# Patient Record
Sex: Male | Born: 1970 | Race: White | Hispanic: No | Smoking: Former smoker
Health system: Southern US, Community
[De-identification: ages and names within clinical notes are randomized; demographics above are authoritative.]

## PROBLEM LIST (undated history)

## (undated) DIAGNOSIS — K219 Gastro-esophageal reflux disease without esophagitis: Secondary | ICD-10-CM

## (undated) DIAGNOSIS — J449 Chronic obstructive pulmonary disease, unspecified: Secondary | ICD-10-CM

## (undated) HISTORY — PX: ESOPHAGOGASTRODUODENOSCOPY ENDOSCOPY: SHX5814

## (undated) HISTORY — DX: Chronic obstructive pulmonary disease, unspecified: J44.9

## (undated) HISTORY — PX: OTHER SURGICAL HISTORY: SHX169

## (undated) HISTORY — PX: CHOLECYSTECTOMY: SHX55

---

## 2003-07-20 ENCOUNTER — Emergency Department (HOSPITAL_COMMUNITY): Admission: EM | Admit: 2003-07-20 | Discharge: 2003-07-20 | Payer: Self-pay | Admitting: *Deleted

## 2007-04-11 ENCOUNTER — Emergency Department (HOSPITAL_COMMUNITY): Admission: EM | Admit: 2007-04-11 | Discharge: 2007-04-12 | Payer: Self-pay | Admitting: Emergency Medicine

## 2007-04-13 ENCOUNTER — Emergency Department (HOSPITAL_COMMUNITY): Admission: EM | Admit: 2007-04-13 | Discharge: 2007-04-14 | Payer: Self-pay | Admitting: Emergency Medicine

## 2007-04-20 ENCOUNTER — Ambulatory Visit (HOSPITAL_COMMUNITY): Admission: RE | Admit: 2007-04-20 | Discharge: 2007-04-20 | Payer: Self-pay | Admitting: Family Medicine

## 2007-05-16 ENCOUNTER — Ambulatory Visit (HOSPITAL_COMMUNITY): Admission: RE | Admit: 2007-05-16 | Discharge: 2007-05-16 | Payer: Self-pay | Admitting: General Surgery

## 2007-05-16 ENCOUNTER — Encounter (INDEPENDENT_AMBULATORY_CARE_PROVIDER_SITE_OTHER): Payer: Self-pay | Admitting: General Surgery

## 2007-05-31 ENCOUNTER — Ambulatory Visit (HOSPITAL_COMMUNITY): Admission: RE | Admit: 2007-05-31 | Discharge: 2007-05-31 | Payer: Self-pay | Admitting: Family Medicine

## 2007-11-24 ENCOUNTER — Ambulatory Visit (HOSPITAL_COMMUNITY): Admission: RE | Admit: 2007-11-24 | Discharge: 2007-11-24 | Payer: Self-pay | Admitting: Internal Medicine

## 2007-11-24 ENCOUNTER — Encounter: Payer: Self-pay | Admitting: Orthopedic Surgery

## 2008-03-20 ENCOUNTER — Ambulatory Visit: Payer: Self-pay | Admitting: Gastroenterology

## 2008-03-23 ENCOUNTER — Ambulatory Visit: Payer: Self-pay | Admitting: Gastroenterology

## 2008-03-23 ENCOUNTER — Encounter: Payer: Self-pay | Admitting: Gastroenterology

## 2008-03-23 ENCOUNTER — Ambulatory Visit (HOSPITAL_COMMUNITY): Admission: RE | Admit: 2008-03-23 | Discharge: 2008-03-23 | Payer: Self-pay | Admitting: Gastroenterology

## 2008-04-04 ENCOUNTER — Ambulatory Visit (HOSPITAL_COMMUNITY): Admission: RE | Admit: 2008-04-04 | Discharge: 2008-04-04 | Payer: Self-pay | Admitting: Gastroenterology

## 2008-04-18 ENCOUNTER — Telehealth (INDEPENDENT_AMBULATORY_CARE_PROVIDER_SITE_OTHER): Payer: Self-pay

## 2008-05-30 DIAGNOSIS — R1013 Epigastric pain: Secondary | ICD-10-CM | POA: Insufficient documentation

## 2008-05-30 DIAGNOSIS — R109 Unspecified abdominal pain: Secondary | ICD-10-CM | POA: Insufficient documentation

## 2008-05-30 DIAGNOSIS — F1010 Alcohol abuse, uncomplicated: Secondary | ICD-10-CM | POA: Insufficient documentation

## 2008-05-30 DIAGNOSIS — R1084 Generalized abdominal pain: Secondary | ICD-10-CM | POA: Insufficient documentation

## 2008-09-05 ENCOUNTER — Ambulatory Visit: Payer: Self-pay | Admitting: Orthopedic Surgery

## 2008-09-05 DIAGNOSIS — M25519 Pain in unspecified shoulder: Secondary | ICD-10-CM | POA: Insufficient documentation

## 2008-09-05 DIAGNOSIS — M7580 Other shoulder lesions, unspecified shoulder: Secondary | ICD-10-CM

## 2008-09-05 DIAGNOSIS — M25819 Other specified joint disorders, unspecified shoulder: Secondary | ICD-10-CM | POA: Insufficient documentation

## 2008-10-25 ENCOUNTER — Ambulatory Visit: Payer: Self-pay | Admitting: Orthopedic Surgery

## 2008-10-25 DIAGNOSIS — M5124 Other intervertebral disc displacement, thoracic region: Secondary | ICD-10-CM | POA: Insufficient documentation

## 2008-11-29 ENCOUNTER — Encounter: Payer: Self-pay | Admitting: Orthopedic Surgery

## 2009-01-23 ENCOUNTER — Ambulatory Visit: Payer: Self-pay | Admitting: Orthopedic Surgery

## 2009-01-25 ENCOUNTER — Telehealth: Payer: Self-pay | Admitting: Orthopedic Surgery

## 2009-03-05 ENCOUNTER — Telehealth: Payer: Self-pay | Admitting: Orthopedic Surgery

## 2009-03-09 HISTORY — PX: COLONOSCOPY: SHX174

## 2009-04-03 ENCOUNTER — Encounter: Payer: Self-pay | Admitting: Orthopedic Surgery

## 2009-10-17 ENCOUNTER — Ambulatory Visit: Payer: Self-pay | Admitting: Gastroenterology

## 2009-10-17 DIAGNOSIS — K219 Gastro-esophageal reflux disease without esophagitis: Secondary | ICD-10-CM | POA: Insufficient documentation

## 2009-12-10 ENCOUNTER — Encounter (INDEPENDENT_AMBULATORY_CARE_PROVIDER_SITE_OTHER): Payer: Self-pay | Admitting: *Deleted

## 2009-12-19 ENCOUNTER — Ambulatory Visit: Payer: Self-pay | Admitting: Gastroenterology

## 2009-12-19 DIAGNOSIS — R1013 Epigastric pain: Secondary | ICD-10-CM

## 2009-12-19 DIAGNOSIS — K3189 Other diseases of stomach and duodenum: Secondary | ICD-10-CM | POA: Insufficient documentation

## 2009-12-19 LAB — CONVERTED CEMR LAB
Cholesterol, target level: 200 mg/dL
HDL goal, serum: 40 mg/dL
LDL Goal: 160 mg/dL

## 2010-01-27 ENCOUNTER — Ambulatory Visit: Payer: Self-pay | Admitting: Internal Medicine

## 2010-01-27 DIAGNOSIS — K921 Melena: Secondary | ICD-10-CM | POA: Insufficient documentation

## 2010-01-28 ENCOUNTER — Encounter: Payer: Self-pay | Admitting: Gastroenterology

## 2010-02-12 ENCOUNTER — Ambulatory Visit (HOSPITAL_COMMUNITY)
Admission: RE | Admit: 2010-02-12 | Discharge: 2010-02-12 | Payer: Self-pay | Source: Home / Self Care | Attending: Gastroenterology | Admitting: Gastroenterology

## 2010-04-08 NOTE — Assessment & Plan Note (Signed)
Summary: LUQ AND CHEST PAIN   Visit Type:  Follow-up Visit Primary Care Provider:  Charlies Silvers, M.D.  Chief Complaint:  left side abd pain and burning.  History of Present Illness: First evaluation JAN 2010-recommended OMP BID. Pt did Burning sensation in chest. Happens up to 2-3 times a day and feel  like he regurgitates as well. Occasional SOB: random. Can radiate to L shoulder blade. No problems swallowing or with vomiting. Has nausea. Feels like a ball of air in epigastrium. Drinks beer still once a week but has Sx worsen with beer sometimes. No weight loss. Appetite: good. Sx precipitated by food. Gained 15 lbs. No cigs. Dips-every day 3-4x/day. Uses Ibuprofen 2-3 times a week.  Current Medications (verified): 1)  Tums .... As Needed  Allergies (verified): No Known Drug Allergies  Past History:  Past Medical History: Gastritis **EGD- JAN 2010, no H. pylori Intermittent constipation and diarrhea: 1 every 2 weeks CHEST PAIN W/U 2009: STRESS TEST, CARDIAC ECHO  Past Surgical History: Cholecystectomy: 2009 Hand surgery  Family History: No FH of Colon Cancer or polyps  Social History: Patient is divorced.  work on [employer] No insurance  Review of Systems       JUNE 2011: 234 LBS  No BRBPR or Melena.  Per HPI otherwise all systems negative  Vital Signs:  Patient profile:   40 year old male Height:      72 inches Weight:      240 pounds BMI:     32.67 Temp:     97.8 degrees F oral Pulse rate:   76 / minute BP sitting:   118 / 88  (left arm) Cuff size:   regular  Vitals Entered By: Hendricks Limes LPN (October 17, 2009 9:08 AM)  Physical Exam  General:  Well developed, well nourished, no acute distress. Head:  Normocephalic and atraumatic. Eyes:  PERRLA, no icterus. Mouth:  No deformity or lesions.  Neck:  Supple; no masses. Lungs:  Clear throughout to auscultation. Heart:  Regular rate and rhythm; no murmurs. Abdomen:  Soft, mild TTP in epigastrium without  guarding and without rebound, nondistended. Normal bowel sounds. Extremities:  No edema or deformities noted. Neurologic:  Alert and  oriented x4;  grossly normal neurologically.  Impression & Recommendations:  Problem # 1:  GERD (ICD-530.81) Assessment New  LUQ and chest pain most like 2o to uncontrolled GERD, +/- NSAID gastritis. Pt's last EGD JAN 2010. Pt had gastritis likly 2o to NSAID use. DID NOT take OMP BECAUSE IT WAS TOO EXPENSIVE. Has gained 15 lbs and has other lifestyle factors contributiing to reflux. He is instructed to: take omeprazole 30 minutes before meals two times a day, FOLLOW REFLUX HO recommendations, Minimize alcohol intake, Lose 10-20 lbs, AND Follow up in 2 months. No acute indication for TCS.  CC: PCP  Orders: Est. Patient Level IV ([address])  Patient Instructions: 1)  Take omeprazole 30 minutes before meals two times a day. 2)  FOLLOW REFLUX HO recommendations. 3)  Minimize alcohol intake. 4)  Lose 10-20 lbs. **Your BMI shows you are obese.  5)  Follow up in 2 months. 6)  The medication list was reviewed and reconciled.  All changed / newly prescribed medications were explained.  A complete medication list was provided to the patient / caregiver. Prescriptions: OMEPRAZOLE 20 MG CPDR (OMEPRAZOLE) 1 by mouth 30 minutes prior to breakfast and supper  #60 x 11   Entered and Authorized by:   West Bali MD  Signed by:   West Bali MD on 10/17/2009   Method used:   Electronically to        [facility] (retail)       [address]       [place], [place]  [address]       Ph: [phone]       Fax: [phone]   RxID:   [id number]   Appended Document: LUQ AND CHEST PAIN 2 MONTH OPV IS IN THE COMPUTER

## 2010-04-08 NOTE — Consult Note (Signed)
Summary: Consult [facility] Danielle Dess  Consult [facility] Danielle Dess   Imported By: Cammie Sickle 06/13/2009 08:51:06  _____________________________________________________________________  External Attachment:    Type:   Image     Comment:   External Document

## 2010-04-08 NOTE — Letter (Signed)
Summary: Recall Office Visit  [facility]  [address]   [place], [place] [address]   Phone: [phone]  Fax: [phone]      December 10, 2009   Jeffrey [name] [address] [place], [place]  [address] [birth year]   Dear [name],   According to our records, it is time for you to schedule a follow-up office visit with [place].   At your convenience, please call [phone] to schedule an office visit. If you have any questions, concerns, or feel that this letter is in error, we would appreciate your call.   Sincerely,    Diana Eves  [facility] Ph: [phone]   Fax: [phone]

## 2010-04-08 NOTE — Letter (Signed)
Summary: TCS ORDER  TCS ORDER   Imported By: Ave Filter 01/28/2010 08:06:51  _____________________________________________________________________  External Attachment:    Type:   Image     Comment:   External Document

## 2010-04-08 NOTE — Assessment & Plan Note (Signed)
Summary: ABD PAIN, CONSTIPATION   Visit Type:  Follow-up Visit Primary Care Provider:  Lilyan Punt  Chief Complaint:  pain.  History of Present Illness: OMP two times a day cured the heartburn. Hurting in left side. Still having constipation. Always gurgling in left side. Having gas from 630-11a. Aching in back shoulder. Shooting pain in his belly. BM: after ExLAx- almost every day. Half good and half bad days. Stress at home, but more over his health. Always feels pressure in epigastrium.   Current Medications (verified): 1)  Omeprazole 20 Mg Cpdr (Omeprazole) .Marland Kitchen.. 1 By Mouth 30 Minutes Prior To Breakfast and Supper  Allergies (verified): No Known Drug Allergies  Past History:  Past Medical History: Last updated: 10/17/2009 Gastritis **EGD- JAN 2010, no H. pylori Intermittent constipation and diarrhea: 1 every 2 weeks CHEST PAIN W/U 2009: STRESS TEST, CARDIAC ECHO  Past Surgical History: Last updated: 10/17/2009 Cholecystectomy: 2009 Hand surgery  Social History: Patient is divorced. 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  Review of Systems       SEP 2010: CT A/P w/ IVC-Mild wall thickening with questioned edema in the ascending colon and hepatic flexure.  There is no substantial pericolonic edema oR inflammation but the vessels of the gastrocolic ligament/omentum are prominent.  Although there is no appreciable pericolonic edema or inflammation, a degree of inflammatory or infectious colitis cannot be excluded.  Vital Signs:  Patient profile:   40 year old male Height:      72 inches Weight:      239 pounds BMI:     32.53 Temp:     98.2 degrees F oral Pulse rate:   84 / minute BP sitting:   110 / 80  (left arm) Cuff size:   large  Vitals Entered By: Cloria Spring LPN (December 19, 2009 4:01 PM)  Physical Exam  General:  Well developed, well nourished, no acute distress. Head:  Normocephalic and atraumatic. Eyes:  PERRL, no icterus. Mouth:  No  deformity or lesions. Neck:  Supple; no masses. Lungs:  Clear throughout to auscultation. Heart:  Regular rate and rhythm; no murmurs. Abdomen:  Soft, MILD TTP RUQ, PERIUMBILICAL REGION AND BLQ, nondistended. Normal bowel sounds.  Impression & Recommendations:  Problem # 1:  DYSPEPSIA (ICD-536.8) Assessment Unchanged Likley 2o to IBS-C and/or NUD. Use Benefiber once daily. USE MILK OF MAGNESIA TABS ONCE DAILY. TAKE IMIPRAMINE AT BEDTIME. ADD PROBIOTICS DAILY. May use Levsin as needed for abd cramps.Drink 6 to 8 cups of water daily or (4) 500 ml bottles of water. Follow up in 6 weeks.  Apply for the [facility] discount. Consder TCS for abd pain/constipation.  CC: PCP  Other Orders: Est. Patient Level IV ([address])   Patient Instructions: 1)  Continue Omeprazole. 2)  Use Benefiber once daily. 3)  USE MILK OF MAGNESIA TABS ONCE DAILY. 4)  TAKE IMIPRAMINE AT BEDTIME. 5)  ADD PROBIOTICS DAILY. 6)  May use Levsin as needed for abd cramps. 7)  Drink 6 to 8 cups of water daily or (4) 500 ml bottles of water. 8)  Follow up in 6 weeks.  9)  Apply for the [facility] discount. 10)  The medication list was reviewed and reconciled.  All changed / newly prescribed medications were explained.  A complete medication list was provided to the patient / caregiver. Prescriptions: IMIPRAMINE HCL 10 MG TABS (IMIPRAMINE HCL) 1 by mouth at bedtime for 3 days then 2 by mouth at bedtime and then 3 by mouth qhs  #  90 x 5   Entered and Authorized by:   West Bali MD   Signed by:   West Bali MD on 12/20/2009   Method used:   Electronically to        [facility] [address] (retail)       [address]       [place], [place]  [address]       Ph: [phone]       Fax: [phone]   RxID:   [id number]   Appended Document: ABD PAIN, CONSTIPATION 6 WK F/U OPV IS IN THE COMPUTER

## 2010-04-10 NOTE — Assessment & Plan Note (Signed)
Summary: FU OV IN 6 WKS,DYSPEPSIA/SS   Visit Type:  Follow-up Visit Primary Care Provider:  Lilyan Punt  Chief Complaint:  F/U dyspepsia.  History of Present Illness: 40 y/o caucasian male w/ left costal margin pain, chronic mid-low back pain since 2007.  Pain worse w/ movement,  c/o foul-smelling flatus.  s/p chlecystectomy in 2008.  BM daily w/ benefiber.  Taking omeprazole 20mg  two times a day with good relief of heartburn & indigestion.  Denies dysphagia & odynophagia. c/o bright red bleeding in small amts on toilet tissue w/ wiping x few times think it may be henorrhoids.  Also noted mucous in stool.  Weight stable.  Appetite fine.  Rare motrin 600mg  for back pain.  Never started imipramine at bedtime, never picked it up.  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 1 By Mouth 30 Minutes Prior To Breakfast and Supper 2)  Imipramine Hcl 10 Mg Tabs (Imipramine Hcl) .Marland Kitchen.. 1 By Mouth At Bedtime For 3 Days Then 2 By Mouth At Bedtime and Then 3 By Mouth Qhs  Current Medications (verified): 1)  Omeprazole 20 Mg Cpdr (Omeprazole) .Marland Kitchen.. 1 By Mouth 30 Minutes Prior To Breakfast and Supper 2)  Benefiber .... Once Daily  Allergies (verified): No Known Drug Allergies  Past History:  Past Surgical History: Last updated: 10/17/2009 Cholecystectomy: 2009 Hand surgery  Past Medical History: Gastritis **EGD- JAN 2010, no H. pylori Intermittent constipation and diarrhea: 1 every 2 weeks CHEST PAIN W/U 2009: STRESS TEST, CARDIAC ECHO THORACIC DISC DISPLACEMENT IMPINGEMENT SYNDROME   Family History: Reviewed history from 10/17/2009 and no  changes required. No FH of Colon Cancer or polyps  Social History: Reviewed history from 12/19/2009 and no changes required. Patient is divorced. 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 Quit 2009 Alcohol Use - yes, 12 pk/wk, once -twice per wk Illicit Drug Use - no current, quit marijuana 2009 Patient gets regular exercise, 3x/wk gym Smoking Status:  quit Drug Use:  yes Does Patient Exercise:  yes  Review of Systems General:  Denies fever, chills, sweats, anorexia, fatigue, weakness, malaise, weight loss, and sleep disorder. CV:  Denies chest pains, angina, palpitations, syncope, dyspnea on exertion, orthopnea, PND, peripheral edema, and claudication. Resp:  Complains of dyspnea with exercise; denies dyspnea at rest, cough, sputum, wheezing, coughing up blood, and pleurisy; occ. GI:  Denies difficulty swallowing, pain on swallowing, vomiting blood, jaundice, and fecal incontinence. GU:  Denies urinary burning, blood in urine, urinary frequency, urinary hesitancy, nocturnal urination, and urinary incontinence. MS:  Complains of joint pain / LOM, joint stiffness, and low back pain. Derm:  Denies rash, itching, dry skin, hives, moles, warts, and unhealing ulcers. Psych:  Denies depression, anxiety, memory loss, suicidal ideation, hallucinations, paranoia, phobia, and confusion. Heme:  Denies bruising and enlarged lymph nodes.  Vital Signs:  Patient profile:   40 year old male Height:      72 inches Weight:      241 pounds BMI:     32.80 Temp:     99.1 degrees F oral Pulse rate:   80 / minute BP sitting:  120 / 80  (left arm) Cuff size:   large  Vitals Entered By: Cloria Spring LPN (January 27, 2010 3:02 PM)  Physical Exam  General:  Well developed, well nourished, no acute distress. Head:  Normocephalic and atraumatic. Eyes:  Sclera clear, no icterus. Mouth:  No deformity or lesions. Neck:  Supple; no masses. Lungs:  Clear throughout to  auscultation. Heart:  Regular rate and rhythm; no murmurs. Abdomen:  normal bowel sounds, obese, without guarding, without rebound, no distesion, no tenderness, no masses, and no hepatomegally or splenomegaly.   Msk:  Symmetrical with no gross deformities. Normal posture. Pulses:  Normal pulses noted. Extremities:  No clubbing, cyanosis, edema or deformities noted. Neurologic:  Alert and  oriented x4;  grossly normal neurologically. Skin:  intact without lesions or rashes Cervical Nodes:  no significant adenopathy Psych:  Alert and cooperative. Normal mood and affect.  Impression & Recommendations:  Problem # 1:  HEMATOCHEZIA (ICD-578.1) 40 y/o caucasian male with intermittant small volume hematocheza, most likely secondary to bening anorectal source, but we should rule out inflammatory bowel disease, NSAID-induced colitis, polyps or least likely colon ca.  Diagnostic colonoscopy to be performed by Dr. Lisbeth Renshaw in the near future.  I have discussed risks and benefits which include, but are not limited to, bleeding, infection, perforation, or medication reaction.  The patient agrees with this plan and consent will be obtained.  Orders: Est. Patient Level IV ([address])  Problem # 2:  ABDOMINAL PAIN (ICD-789.00) Chronic abd pain , most likely 2o to uncontrolled GERD, +/- NSAID gastritis, +/- functional component.  Pt has not yet tried imipramine.  Problem # 3:  GERD (ICD-530.81) Well controlled  Patient Instructions: 1)  Continue omeprazole 20 mg two times a day.  Appended Document: FU OV IN 6 WKS,DYSPEPSIA/SS 1.2 CM RECTAL POLYP. HEMORRHOIDS

## 2010-07-22 NOTE — H&P (Signed)
NAME:  Jeffrey [name], Jeffrey [name] NO.:  [id number]   MEDICAL RECORD NO.:  [id number]          PATIENT TYPE:  AMB   LOCATION:  DAY                           FACILITY:  [facility]   PHYSICIAN:  Dalia Heading, M.D.  DATE OF BIRTH:  08-11-1970   DATE OF ADMISSION:  DATE OF DISCHARGE:  LH                              HISTORY & PHYSICAL   CHIEF COMPLAINT:  Cholecystitis, cholelithiasis.   HISTORY OF PRESENT ILLNESS:  The patient is a 40 year old white male who  is referred for evaluation and treatment of biliary colic secondary to  cholelithiasis.  He also has biliary sludge present.  It has been  present intermittently for the past month.  He has right upper quadrant  abdominal pain with radiation to the flank, nausea, and indigestion.  It  is made worse with fatty foods.  No fever, chills, jaundice have been  noted.   PAST MEDICAL HISTORY:  Is unremarkable.   PAST SURGICAL HISTORY:  Unremarkable.   CURRENT MEDICATIONS:  None.   ALLERGIES:  No known drug allergies.   REVIEW OF SYSTEMS:  The patient does smoke tobacco.  He does drink  alcohol socially.   PHYSICAL EXAMINATION:  The patient is a well-developed, well-nourished  white male in no acute distress.  HEENT EXAMINATION:  Reveals no scleral icterus.  LUNGS:  Clear to auscultation with equal breath sounds bilaterally.  HEART EXAMINATION:  Reveals regular rate and rhythm without S3, S4, or  murmurs.  ABDOMEN:  Is soft, nontender, nondistended.  No hepatosplenomegaly,  masses, or hernias are identified.   Ultrasound gallbladder reveals sludge within a normal common bile duct.   IMPRESSION:  Cholecystitis, cholelithiasis.   PLAN:  The patient is scheduled for laparoscopic cholecystectomy on  May 16, 2007.  Risks and benefits of the procedure including bleeding,  infection, hepatobiliary injury, and the possibility of an open  procedure were fully explained to the patient, who gave informed  consent.      Dalia Heading, M.D.  Electronically Signed     MAJ/MEDQ  D:  05/12/2007  T:  05/12/2007  Job:  [id number]

## 2010-07-22 NOTE — Op Note (Signed)
NAME:  Jeffrey [name], Jeffrey [name] NO.:  [id number]   MEDICAL RECORD NO.:  [id number]          PATIENT TYPE:  AMB   LOCATION:  DAY                           FACILITY:  [facility]   PHYSICIAN:  Jeffrey [name], M.D.      DATE OF BIRTH:  13-Oct-1970   DATE OF PROCEDURE:  03/23/2008  DATE OF DISCHARGE:                                PROCEDURE NOTE   REFERRING Jeffrey [name]:  Jeffrey Picket A. Luking, MD   PROCEDURE:  Esophagogastroduodenoscopy with cold forceps biopsy, gastric  and duodenal mucosa.   INDICATION FOR EXAM:  Jeffrey [name] is a 40 year old male who complains of  abdominal discomfort.  He has had this discomfort for a year.  He had  his gallbladder removed because of it and some of his symptoms did  resolve.  There is achiness across the top of his abdomen and a  quivering.  He does drink alcohol.  He denies regular consumption of  anti-inflammatory drugs.  He notes that his symptoms are most when he is  sitting.  He does have difficulty moving his bowels since his  gallbladder came out. His symptoms are better with ice and exercise.   FINDINGS:  1. Normal esophagus without evidence of Barrett's, mass, erosion,      ulceration, or stricture.  2. Patchy erythema in the body and the antrum without erosion or      ulceration.  Biopsies obtained via cold forceps to evaluate for H.      pylori gastritis or eosinophilic gastritis.  3. Normal duodenal bulb and second portion of the duodenum.  Normal      ampulla.  Biopsies obtained via cold forceps to evaluate for celiac      sprue or eosinophilic duodenitis.   DIAGNOSES:  Mild gastritis.   RECOMMENDATIONS:  1. He should add Benefiber twice daily.  He should follow a high-      fiber, low-fat diet.  2. Will add Nexium 40 mg twice daily for 3 months.  He is asked to      avoid alcohol for 1 month.  3. No aspirin, NSAIDs, or anticoagulation for 7 days.  4. He already has a follow up appointment to see me in 2 months.  If      his symptoms  persist, then we will consider endoscopic ultrasound.  5. Could consider CT scan of the abdomen and pelvis with IV contrast      prior to endoscopic ultrasound.  His last abdominal imaging was an      U/S in February 2009.   MEDICATIONS:  1. Demerol 100 mg IV.  2. Versed 4 mg IV.  3. Phenergan 25 mg IV.   PROCEDURE TECHNIQUE:  Physical exam was performed.  Informed consent was  obtained from the patient after explaining the benefits, risks, and  alternatives to the procedure.  The patient was connected to the monitor  and placed in the left lateral position.  Continuous oxygen was provided  by nasal cannula and IV medicine was administered through an indwelling  cannula.  After administration of sedation, the patient's esophagus was  intubated and  the scope was advanced under direct visualization to the  second portion of the duodenum.  The scope was removed slowly by  carefully examining the color, texture, anatomy, and integrity of the  mucosa on the way out.  The patient was recovered in endoscopy and  discharged home in satisfactory condition.   PATH 88416:  nORMAL DUODENUM. mILD GASTRITIS. Will order CT Scan of  abd/PELVIS.      Jeffrey [name], M.D.  Electronically Signed     SM/MEDQ  D:  03/23/2008  T:  03/24/2008  Job:  [id number]   cc:   Jeffrey Picket A. Gerda Diss, MD  Fax: [phone]

## 2010-07-22 NOTE — H&P (Signed)
NAME:  Jeffrey [name], Jeffrey [name] NO.:  [id number]   MEDICAL RECORD NO.:  [id number]          PATIENT TYPE:  AMB   LOCATION:  DAY                           FACILITY:  [facility]   PHYSICIAN:  Kassie Mends, M.D.      DATE OF BIRTH:  11/08/1970   DATE OF ADMISSION:  DATE OF DISCHARGE:  LH                              HISTORY & PHYSICAL   REFERRING Aivan Fillingim:  Scott A. Gerda Diss, MD   REASON FOR VISIT:  Abdominal discomfort.   HISTORY OF PRESENT ILLNESS:  Jeffrey [name] is a 40 year old male who has  been having abdominal discomfort in January 2009.  It began at a super  bowl party.  He was drinking beer with his friends.  He came home and  had uncomfortable feeling in his chest.  He reports being possibly on  Nexium for 3 weeks which did not affect his chest pain.  He reportedly  had a cardiac workup which did not reveal any evidence of coronary  artery disease.  He was seen and evaluated in March 2009 for  cholelithiasis.  It was felt that perhaps the symptoms were related to  biliary colic.  He had an abdominal ultrasound in February 2009 that  showed biliary sludge and a normal common bile duct as well as pancreas.  In March 2009, he had a CT scan of the chest with contrast which showed  hyperaeration of the lungs, otherwise, it was an unremarkable CT scan of  the chest.  MRI of the thoracic spine without contrast in September 2009  revealed small paracentral disk perfusion in C7-T2 and a posterior  lateral disk bulge between T3-T4.  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  His  working out does not seem to exacerbate his discomfort.  The discomfort  that he has is an achiness that is across the top of his abdomen and  sometimes he feels that in his back.  He also sometimes feels a  quivering which is separate from the discomfort.  The quivering happens  once a week.  These symptoms of discomfort in his abdomen begin around 6-  7 p.m.  and last from 9-10 p.m.  He has tried ibuprofen and Prilosec in  the past without any change in his symptoms.  His alcohol consumption is  2 beers per week.  He does not use BC Powders, Goody's Powders, or  aspirin.  He notice his symptoms most when he is sitting.  His appetite  has been good.  He never eats spicy foods.  Removing his gallbladder did  help with his symptoms of chills, shaking, and clammy hands.  He has no  nausea, vomiting, or fever.  His face periodically gets red and hot.  Denies any diaphoresis or palpitations.  He is not having any problems  with his blood pressure or diarrhea.  Taking his gallbladder out caused  him to have more difficulty having bowel movements.  He may have a bowel  movement once every day or two.  He uses Ex-Lax as  needed to have a  bowel movement.  He denies any dysuria, hematuria, or pain in his  testicles.  He was examined and his testicles have felt something that  felt like a worm.  He denies any penile discharge, indigestion, problems  swallowing, or heartburn.  He has had no weight loss and his stress is  not increased.  One or two times a week, he does have shortness of  breath, but denies cough.  He cannot identify anything that precipitates  the discomfort and nothing makes it worse.  His symptoms are better with  ice and exercise.   PAST MEDICAL HISTORY:  None.   PAST SURGICAL HISTORY:  Per the HPI.   ALLERGIES:  No known drug allergies.   MEDICATIONS:  None.   FAMILY HISTORY:  He has no family history of colon cancer, colon polyps,  breast, uterine or ovarian cancer, or gastric cancer.   SOCIAL HISTORY:  He is separated for approximately 4 years and has 3  children age 64, 98, and 65.  He quit smoking in November 2009.  His  last alcohol consumption was last Friday.  He works at [employer].   PHYSICAL EXAMINATION:  VITAL SIGNS:  Weight 230 pounds, height 6 feet,  BMI 31.2 (slightly obese), temperature 98, blood  pressure 124/88, pulse  76.  GENERAL:  He is in no apparent distress, alert and oriented x4.  HEENT:  Atraumatic, normocephalic.  Pupils equal and reactive to light.  Mouth no oral lesions.  Posterior oropharynx without erythema or  exudate.  NECK:  Full range of motion.  No lymphadenopathy.  LUNGS:  Clear to auscultation bilaterally.  CARDIOVASCULAR:  Regular rhythm.  No murmur.  Normal S1 and S2.  ABDOMEN:  Bowel sounds present, soft, nontender, nondistended.  No  rebound or guarding.  No abdominal bruits.  Negative Carnett sign was  raised in his legs and his head and neck off the exam table.  EXTREMITIES:  No cyanosis or edema.  NEUROLOGIC:  He has no focal neurologic deficits.   ASSESSMENT:  Jeffrey [name] is a 40 year old male with epigastric  discomfort of unclear etiology.  The differential diagnosis includes non-  ulcer dyspepsia, uncontrolled gastroesophageal reflux disease,  pancreatic disease, or musculoskeletal, abdominal wall discomfort.  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   Thank you for allowing me to see Jeffrey [name] in consultation.  My  recommendations follow.   RECOMMENDATIONS:  1. He will be scheduled for an EGD and a biopsy of his gastric and his      duodenal mucosa to evaluate for celiac sprue, H.  Pylori gastritis,      and eosinophilic gastroenteritis as an etiology for his symptoms.      If his biopsies do not reveal an etiology for his symptoms, then      will ask him to take high dose PPI for 3 months.  If his symptoms      still persist, then he will be scheduled for an endoscopic      ultrasound.  2. He has a follow appointment to see me in 2 months.  Will not add a      PPI at this point due to prior treatment failure.      Kassie Mends, M.D.  Electronically Signed     SM/MEDQ  D:  03/20/2008  T:  03/21/2008  Job:  [id number]   cc:   Lorin Picket A. Luking,  MD  Fax: [phone]

## 2010-07-22 NOTE — Op Note (Signed)
NAME:  Jeffrey [name], Jeffrey [name] NO.:  [id number]   MEDICAL RECORD NO.:  [id number]          PATIENT TYPE:  AMB   LOCATION:  DAY                           FACILITY:  [facility]   PHYSICIAN:  Dalia Heading, M.D.  DATE OF BIRTH:  1971-01-29   DATE OF PROCEDURE:  05/16/2007  DATE OF DISCHARGE:                               OPERATIVE REPORT   PREOPERATIVE DIAGNOSIS:  Cholecystitis, cholelithiasis.   POSTOPERATIVE DIAGNOSIS:  Cholecystitis, cholelithiasis.   PROCEDURE:  Laparoscopic cholecystectomy.   SURGEON:  Dr. Franky Macho.   ANESTHESIA:  General endotracheal.   INDICATIONS:  The patient is a 40 year old white male who presents with  biliary colic secondary to cholelithiasis.  Risks and benefits of the  procedure including bleeding, infection, hepatobiliary, the possibility  of an open procedure were fully explained to the patient, who gave  informed consent.   PROCEDURE NOTE:  The patient was placed in supine position.  After  induction of general endotracheal anesthesia, the abdomen was prepped  and draped in the usual sterile technique with Betadine.  Surgical site  confirmation was performed.   A supraumbilical incision was made down to fascia.  Veress needle was  introduced into the abdominal cavity and confirmation of placement was  done using the saline drop test.  The abdomen was then insufflated to 16  mmHg pressure.  An 11 mm trocar was introduced into the abdominal cavity  under direct visualization without difficulty.  The patient was placed  in reversed Trendelenburg position.  An additional 11-mm trocar was  placed epigastric region and 5-mm trocars placed in the right upper  quadrant and right flank regions.  Liver was inspected and noted to be  within normal limits.  The gallbladder was retracted superior and  laterally.  The dissection was begun around the infundibulum of the  gallbladder.  The cystic duct was first identified.  Its juncture to the  infundibulum fully identified.  Endo clips placed proximally and  distally on the cystic duct and cystic duct was divided.  This was  likewise done to the cystic artery.  The gallbladder then freed away  from the gallbladder fossa using Bovie electrocautery.  The gallbladder  delivered through the epigastric trocar site using EndoCatch bag.  The  gallbladder fossa was inspected.  No abnormal bleeding or bile leakage  was noted.  Surgicel was placed in the gallbladder fossa.  All fluid and  air were then evacuated from the abdominal cavity prior to removal of  the trocars.   All wounds were irrigated with normal saline.  All wounds were checked  with 0.5 cm Sensorcaine.  The supraumbilical fascia was reapproximated  using a 0 Vicryl interrupted suture.  All skin incisions were closed  using staples.  Betadine ointment and dry sterile dressings were  applied.   All tape and needle counts were correct at the end of procedure.  The  patient was extubated in the operating room, went back to recovery room  awake in stable condition.   COMPLICATIONS:  None.   SPECIMEN:  Gallbladder.   BLOOD LOSS:  Minimal.  Dalia Heading, M.D.  Electronically Signed     MAJ/MEDQ  D:  05/16/2007  T:  05/17/2007  Job:  [id number]

## 2010-11-28 LAB — POCT CARDIAC MARKERS
CKMB, poc: <1 — ABNORMAL LOW
CKMB, poc: <1 — ABNORMAL LOW
CKMB, poc: <1 — ABNORMAL LOW
CKMB, poc: <1 — ABNORMAL LOW
Myoglobin, poc: 47
Myoglobin, poc: 50
Myoglobin, poc: 33.2
Myoglobin, poc: 41.8
Operator id: 208461
Operator id: 216221
Operator id: 196461
Operator id: 294341
Troponin i, poc: <0.05
Troponin i, poc: <0.05
Troponin i, poc: <0.05
Troponin i, poc: <0.05

## 2010-11-28 LAB — BASIC METABOLIC PANEL
BUN: 16
CO2: 25
Calcium: 9
Chloride: 105
Creatinine, Ser: 0.97
GFR calc Af Amer: >60
GFR calc non Af Amer: >60
Glucose, Bld: 101 — ABNORMAL HIGH
Potassium: 3.5
Sodium: 135

## 2010-12-01 LAB — CBC
HCT: 40.7
Hemoglobin: 14.5
MCHC: 35.6
MCV: 88.2
Platelets: 170
RBC: 4.61
RDW: 12.5
WBC: 4.8

## 2010-12-01 LAB — BASIC METABOLIC PANEL
BUN: 13
CO2: 27
Calcium: 9.1
Chloride: 103
Creatinine, Ser: 0.97
GFR calc Af Amer: >60
GFR calc non Af Amer: >60
Glucose, Bld: 85
Potassium: 3.8
Sodium: 135

## 2010-12-01 LAB — HEPATIC FUNCTION PANEL
ALT: 22
AST: 19
Albumin: 4.3
Alkaline Phosphatase: 58
Bilirubin, Direct: 0.1
Indirect Bilirubin: 0.3
Total Bilirubin: 0.4
Total Protein: 6.8

## 2011-02-28 ENCOUNTER — Other Ambulatory Visit: Payer: Self-pay | Admitting: Gastroenterology

## 2011-03-04 NOTE — Telephone Encounter (Signed)
Reminder in epic to follow up in 6 months to further refills

## 2011-03-04 NOTE — Telephone Encounter (Signed)
Please nic ov in 6 months.

## 2011-03-04 NOTE — Telephone Encounter (Signed)
Needs OV in six months.

## 2011-08-14 ENCOUNTER — Encounter: Payer: Self-pay | Admitting: Gastroenterology

## 2012-02-02 ENCOUNTER — Other Ambulatory Visit: Payer: Self-pay | Admitting: Gastroenterology

## 2012-02-02 ENCOUNTER — Encounter: Payer: Self-pay | Admitting: Gastroenterology

## 2012-02-02 NOTE — Telephone Encounter (Signed)
Mailed letter to patient to call our office to set up OV to further refills °

## 2012-02-02 NOTE — Telephone Encounter (Signed)
Needs OV for further refills

## 2012-02-19 ENCOUNTER — Encounter (HOSPITAL_COMMUNITY): Payer: Self-pay | Admitting: *Deleted

## 2012-02-19 ENCOUNTER — Emergency Department (HOSPITAL_COMMUNITY)
Admission: EM | Admit: 2012-02-19 | Discharge: 2012-02-19 | Disposition: A | Discharge status: Home / Self Care | Payer: Self-pay | Attending: Emergency Medicine | Admitting: Emergency Medicine

## 2012-02-19 ENCOUNTER — Emergency Department (HOSPITAL_COMMUNITY): Payer: Self-pay

## 2012-02-19 DIAGNOSIS — Z87891 Personal history of nicotine dependence: Secondary | ICD-10-CM | POA: Insufficient documentation

## 2012-02-19 DIAGNOSIS — R079 Chest pain, unspecified: Secondary | ICD-10-CM | POA: Insufficient documentation

## 2012-02-19 DIAGNOSIS — Z79899 Other long term (current) drug therapy: Secondary | ICD-10-CM | POA: Insufficient documentation

## 2012-02-19 LAB — COMPREHENSIVE METABOLIC PANEL
ALT: 27 U/L (ref 0–53)
AST: 21 U/L (ref 0–37)
Albumin: 4.2 g/dL (ref 3.5–5.2)
Alkaline Phosphatase: 72 U/L (ref 39–117)
BUN: 15 mg/dL (ref 6–23)
CO2: 28 mEq/L (ref 19–32)
Calcium: 9.2 mg/dL (ref 8.4–10.5)
Chloride: 103 mEq/L (ref 96–112)
Creatinine, Ser: 1.03 mg/dL (ref 0.50–1.35)
GFR calc Af Amer: >90 mL/min (ref >90)
GFR calc non Af Amer: 90 mL/min — ABNORMAL LOW (ref >90)
Glucose, Bld: 92 mg/dL (ref 70–99)
Potassium: 4 mEq/L (ref 3.5–5.1)
Sodium: 139 mEq/L (ref 135–145)
Total Bilirubin: 0.3 mg/dL (ref 0.3–1.2)
Total Protein: 7.6 g/dL (ref 6.0–8.3)

## 2012-02-19 LAB — TROPONIN I: Troponin I: <0.3 ng/mL (ref <0.30)

## 2012-02-19 LAB — CBC WITH DIFFERENTIAL/PLATELET
Basophils Absolute: 0 10*3/uL (ref 0.0–0.1)
Basophils Relative: 0 % (ref 0–1)
Eosinophils Absolute: 0 10*3/uL (ref 0.0–0.7)
Eosinophils Relative: 1 % (ref 0–5)
HCT: 41 % (ref 39.0–52.0)
Hemoglobin: 14.3 g/dL (ref 13.0–17.0)
Lymphocytes Relative: 28 % (ref 12–46)
Lymphs Abs: 1 10*3/uL (ref 0.7–4.0)
MCH: 31.5 pg (ref 26.0–34.0)
MCHC: 34.9 g/dL (ref 30.0–36.0)
MCV: 90.3 fL (ref 78.0–100.0)
Monocytes Absolute: 0.5 10*3/uL (ref 0.1–1.0)
Monocytes Relative: 13 % — ABNORMAL HIGH (ref 3–12)
Neutro Abs: 2.1 10*3/uL (ref 1.7–7.7)
Neutrophils Relative %: 58 % (ref 43–77)
Platelets: 160 10*3/uL (ref 150–400)
RBC: 4.54 MIL/uL (ref 4.22–5.81)
RDW: 12.7 % (ref 11.5–15.5)
WBC: 3.6 10*3/uL — ABNORMAL LOW (ref 4.0–10.5)

## 2012-02-19 NOTE — ED Provider Notes (Signed)
History     CSN: [id number]  Arrival date & time 02/19/12  1145   First MD Initiated Contact with Patient 02/19/12 1337      Chief Complaint  Patient presents with  . Chest Pain    (Consider location/radiation/quality/duration/timing/severity/associated sxs/prior treatment) Patient is a 41 y.o. male presenting with chest pain. The history is provided by the patient (pt complains of chest pain and numbness in hands and feet). No language interpreter was used.  Chest Pain The chest pain began 1 - 2 weeks ago. Chest pain occurs frequently. The chest pain is resolved. Associated with: othing. At its most intense, the pain is at 3/10. The pain is currently at 2/10. The severity of the pain is moderate. The quality of the pain is described as aching. The pain does not radiate. Pertinent negatives for primary symptoms include no fatigue, no cough and no abdominal pain.  Pertinent negatives for past medical history include no seizures.     History reviewed. No pertinent past medical history.  Past Surgical History  Procedure Date  . Cholecystectomy   . Esophagogastroduodenoscopy endoscopy   . Cardiac stress test     History reviewed. No pertinent family history.  History  Substance Use Topics  . Smoking status: Former Games developer  . Smokeless tobacco: Not on file  . Alcohol Use: Yes      Review of Systems  Constitutional: Negative for fatigue.  HENT: Negative for congestion, sinus pressure and ear discharge.   Eyes: Negative for discharge.  Respiratory: Negative for cough.   Cardiovascular: Positive for chest pain.  Gastrointestinal: Negative for abdominal pain and diarrhea.  Genitourinary: Negative for frequency and hematuria.  Musculoskeletal: Negative for back pain.  Skin: Negative for rash.  Neurological: Negative for seizures and headaches.  Hematological: Negative.   Psychiatric/Behavioral: Negative for hallucinations.    Allergies  Review of patient's allergies  indicates no known allergies.  Home Medications   Current Outpatient Rx  Name  Route  Sig  Dispense  Refill  . ADULT MULTIVITAMIN W/MINERALS CH   Oral   Take 1 tablet by mouth daily.         Marland Kitchen FISH OIL TRIPLE STRENGTH 1400 MG PO CAPS   Oral   Take 1 capsule by mouth every other day.         Marland Kitchen OMEPRAZOLE 20 MG PO CPDR                 BP 130/80  Pulse 70  Temp 97.6 F (36.4 C) (Oral)  Resp 16  Ht 6' (1.829 m)  Wt 225 lb (102.059 kg)  BMI 30.52 kg/m2  SpO2 100%  Physical Exam  Constitutional: He is oriented to person, place, and time. He appears well-developed.  HENT:  Head: Normocephalic and atraumatic.  Eyes: Conjunctivae normal and EOM are normal. No scleral icterus.  Neck: Neck supple. No thyromegaly present.  Cardiovascular: Normal rate and regular rhythm.  Exam reveals no gallop and no friction rub.   No murmur heard. Pulmonary/Chest: No stridor. He has no wheezes. He has no rales. He exhibits no tenderness.  Abdominal: He exhibits no distension. There is no tenderness. There is no rebound.  Musculoskeletal: Normal range of motion. He exhibits no edema.  Lymphadenopathy:    He has no cervical adenopathy.  Neurological: He is oriented to person, place, and time. Coordination normal.  Skin: No rash noted. No erythema.  Psychiatric: He has a normal mood and affect. His behavior is normal.  ED Course  Procedures (including critical care time)  Labs Reviewed  CBC WITH DIFFERENTIAL - Abnormal; Notable for the following:    WBC 3.6 (*)     Monocytes Relative 13 (*)     All other components within normal limits  COMPREHENSIVE METABOLIC PANEL - Abnormal; Notable for the following:    GFR calc non Af Amer 90 (*)     All other components within normal limits  TROPONIN I   Dg Chest 2 View  02/19/2012  *RADIOLOGY REPORT*  Clinical Data: Chest pain, shortness of breath.  CHEST - 2 VIEW  Comparison: 05/31/2007 CT  Findings: Cardiomediastinal contours within  normal range. Small right lower lobe nodular opacity is likely nipple shadow. Otherwise, lungs are predominately clear.  No pleural effusion or pneumothorax.  No acute osseous finding.  IMPRESSION: No radiographic evidence of acute cardiopulmonary process.  A small right lower lobe nodular opacity may correspond to nipple shadow can be confirmed with a follow-up with nipple markers.   Original Report Authenticated By: Jearld Lesch, M.D.      1. Chest pain       Date: 02/19/2012  Rate: 69  Rhythm: normal sinus rhythm  QRS Axis: normal  Intervals: normal  ST/T Wave abnormalities: normal  Conduction Disutrbances:none  Narrative Interpretation:   Old EKG Reviewed: none available   MDM          Benny Lennert, MD 02/19/12 1537

## 2012-02-19 NOTE — ED Notes (Signed)
Pain epigastric area intermittently  For 5 years, has had stress tests and ekg s done that were normal.  Now  Says he has noticed that hands and feet "get cold".for 1 week,  With sob.  Pain in t-spine area also.

## 2012-04-23 ENCOUNTER — Emergency Department (HOSPITAL_COMMUNITY): Payer: Self-pay

## 2012-04-23 ENCOUNTER — Encounter (HOSPITAL_COMMUNITY): Payer: Self-pay | Admitting: *Deleted

## 2012-04-23 ENCOUNTER — Emergency Department (HOSPITAL_COMMUNITY)
Admission: EM | Admit: 2012-04-23 | Discharge: 2012-04-23 | Disposition: A | Discharge status: Home / Self Care | Payer: Self-pay | Attending: Emergency Medicine | Admitting: Emergency Medicine

## 2012-04-23 DIAGNOSIS — R0602 Shortness of breath: Secondary | ICD-10-CM | POA: Insufficient documentation

## 2012-04-23 DIAGNOSIS — R509 Fever, unspecified: Secondary | ICD-10-CM | POA: Insufficient documentation

## 2012-04-23 DIAGNOSIS — R52 Pain, unspecified: Secondary | ICD-10-CM | POA: Insufficient documentation

## 2012-04-23 DIAGNOSIS — J111 Influenza due to unidentified influenza virus with other respiratory manifestations: Secondary | ICD-10-CM

## 2012-04-23 DIAGNOSIS — R059 Cough, unspecified: Secondary | ICD-10-CM | POA: Insufficient documentation

## 2012-04-23 DIAGNOSIS — R51 Headache: Secondary | ICD-10-CM | POA: Insufficient documentation

## 2012-04-23 DIAGNOSIS — R05 Cough: Secondary | ICD-10-CM | POA: Insufficient documentation

## 2012-04-23 DIAGNOSIS — J3489 Other specified disorders of nose and nasal sinuses: Secondary | ICD-10-CM | POA: Insufficient documentation

## 2012-04-23 DIAGNOSIS — Z87891 Personal history of nicotine dependence: Secondary | ICD-10-CM | POA: Insufficient documentation

## 2012-04-23 DIAGNOSIS — Z79899 Other long term (current) drug therapy: Secondary | ICD-10-CM | POA: Insufficient documentation

## 2012-04-23 DIAGNOSIS — IMO0001 Reserved for inherently not codable concepts without codable children: Secondary | ICD-10-CM | POA: Insufficient documentation

## 2012-04-23 MED ORDER — ALBUTEROL SULFATE HFA 108 (90 BASE) MCG/ACT IN AERS: 2.0000 | INHALATION_SPRAY | RESPIRATORY_TRACT | Status: DC | PRN

## 2012-04-23 MED ORDER — NAPROXEN 500 MG PO TABS: 500.0000 mg | ORAL_TABLET | Freq: Two times a day (BID) | ORAL | Status: DC

## 2012-04-23 MED ORDER — HYDROCODONE-ACETAMINOPHEN 5-325 MG PO TABS: 2.0000 | ORAL_TABLET | ORAL | Status: DC | PRN

## 2012-04-23 MED ORDER — ACETAMINOPHEN 325 MG PO TABS
650.0000 mg | ORAL_TABLET | Freq: Once | ORAL | Status: AC
  Administered 2012-04-23: 650 mg via ORAL
  Filled 2012-04-23: qty 2

## 2012-04-23 MED ORDER — KETOROLAC TROMETHAMINE 60 MG/2ML IM SOLN
60.0000 mg | Freq: Once | INTRAMUSCULAR | Status: AC
  Administered 2012-04-23: 60 mg via INTRAMUSCULAR
  Filled 2012-04-23: qty 2

## 2012-04-23 MED ORDER — OSELTAMIVIR PHOSPHATE 75 MG PO CAPS: 75.0000 mg | ORAL_CAPSULE | Freq: Two times a day (BID) | ORAL | Status: DC

## 2012-04-23 NOTE — ED Notes (Addendum)
Pt c/o nasal congestion, generalized body aches, fever, and decreased appetite x 2 days. Pt had ibuprofen at 3pm.

## 2012-04-23 NOTE — ED Provider Notes (Signed)
History     CSN: [id number]  Arrival date & time 04/23/12  4098   First MD Initiated Contact with Patient 04/23/12 1929      Chief Complaint  Patient presents with  . Fever  . Generalized Body Aches  . Headache    (Consider location/radiation/quality/duration/timing/severity/associated sxs/prior treatment) HPI Comments: Pt is a 42 y/o male with hx of gradual onset  2 days ago  of body aches, fever and cough.  Sx are constant, gradually worsening and nothing makes better or worse - stook motrin prior to arrival without improvement.  He did not get Flu shot this year.  He denies dysuria, diarrhea, abd pain, sore throat or runny nose.  Patient is a 42 y.o. male presenting with fever and headaches. The history is provided by the patient and a relative.  Fever Associated symptoms: cough, headaches and myalgias   Associated symptoms: no chest pain   Headache Associated symptoms: cough, fever and myalgias     History reviewed. No pertinent past medical history.  Past Surgical History  Procedure Laterality Date  . Cholecystectomy    . Esophagogastroduodenoscopy endoscopy    . Cardiac stress test      No family history on file.  History  Substance Use Topics  . Smoking status: Former Games developer  . Smokeless tobacco: Not on file  . Alcohol Use: Yes      Review of Systems  Constitutional: Positive for fever.  Respiratory: Positive for cough and shortness of breath.   Cardiovascular: Negative for chest pain.  Musculoskeletal: Positive for myalgias.  Neurological: Positive for headaches.  All other systems reviewed and are negative.    Allergies  Review of patient's allergies indicates no known allergies.  Home Medications   Current Outpatient Rx  Name  Route  Sig  Dispense  Refill  . Multiple Vitamin (MULTIVITAMIN WITH MINERALS) TABS   Oral   Take 1 tablet by mouth daily.         . Omega-3 Fatty Acids (FISH OIL TRIPLE STRENGTH) 1400 MG CAPS   Oral   Take 1  capsule by mouth every other day.         Marland Kitchen omeprazole (PRILOSEC) 20 MG capsule   Oral   Take 20 mg by mouth daily.          Marland Kitchen albuterol (PROVENTIL HFA;VENTOLIN HFA) 108 (90 BASE) MCG/ACT inhaler   Inhalation   Inhale 2 puffs into the lungs every 4 (four) hours as needed for wheezing or shortness of breath.   1 Inhaler   3   . HYDROcodone-acetaminophen (NORCO/VICODIN) 5-325 MG per tablet   Oral   Take 2 tablets by mouth every 4 (four) hours as needed for pain.   10 tablet   0   . naproxen (NAPROSYN) 500 MG tablet   Oral   Take 1 tablet (500 mg total) by mouth 2 (two) times daily with a meal.   30 tablet   0   . oseltamivir (TAMIFLU) 75 MG capsule   Oral   Take 1 capsule (75 mg total) by mouth every 12 (twelve) hours.   10 capsule   0     BP 116/81  Pulse 100  Temp(Src) 102.9 F (39.4 C) (Oral)  Ht 6' (1.829 m)  Wt 225 lb (102.059 kg)  BMI 30.51 kg/m2  SpO2 92%  Physical Exam  Nursing note and vitals reviewed. Constitutional: He appears well-developed and well-nourished.  Uncomfortable appearing  HENT:  Head: Normocephalic and atraumatic.  Mouth/Throat:  Oropharynx is clear and moist. No oropharyngeal exudate.  Oropharynx is clear without erythema exudate asymmetry or hypertrophy. Mucous membranes are moist, tympanic membranes are clear bilaterally, nasal passages are clear  Eyes: Conjunctivae and EOM are normal. Pupils are equal, round, and reactive to light. Right eye exhibits no discharge. Left eye exhibits no discharge. No scleral icterus.  Neck: Normal range of motion. Neck supple. No JVD present. No thyromegaly present.  Very supple neck without any meningismus, no lymphadenopathy and no thyromegaly  Cardiovascular: Normal rate, regular rhythm, normal heart sounds and intact distal pulses.  Exam reveals no gallop and no friction rub.   No murmur heard. Pulmonary/Chest: Effort normal and breath sounds normal. No respiratory distress. He has no wheezes. He  has no rales.  Speaks in full sentences, no increased work of breathing, normal lung sounds without wheezing rales or rhonchi, no accessory muscle use  Abdominal: Soft. Bowel sounds are normal. He exhibits no distension and no mass. There is no tenderness.  Musculoskeletal: Normal range of motion. He exhibits no edema and no tenderness.  Lymphadenopathy:    He has no cervical adenopathy.  Neurological: He is alert. Coordination normal.  Skin: Skin is warm and dry. No rash noted. No erythema.  Psychiatric: He has a normal mood and affect. His behavior is normal.    ED Course  Procedures (including critical care time)  Labs Reviewed - No data to display Dg Chest 2 View  04/23/2012  *RADIOLOGY REPORT*  Clinical Data: Cough and SOB.  Wheezing  CHEST - 2 VIEW  Comparison: 02/19/2012  Findings: Normal heart size.  No effusion or edema.  No airspace consolidation.  The visualized osseous structures is unremarkable.  IMPRESSION:  1.  No acute cardiopulmonary abnormalities.  No evidence for pneumonia.   Original Report Authenticated By: Signa Kell, M.D.      1. Influenza       MDM  The patient is a fever of 102.9, a pulse of 100, respirations are even and unlabored his oxygen saturation registered at 92% at triage. I will obtain a chest x-ray to rule out a pneumonia though I suspect that he has the flu given his myalgias, headache and dry cough. Toradol given for both myalgias and fever.   Pt improved after meds, home with f/u.  PA and lateral views of the chest were obtained by digital radiography. I have personally interpreted these x-rays and find her to be no signs of pulmonary infiltrate, cardiomegaly, subdiaphragmatic free air, soft tissue abnormality, no obvious bony abnormalities or fractures.  Tamiflu  Naprosyn  Vicodin  Albuterol MDI   Vida Roller, MD 04/23/12 2033

## 2012-04-23 NOTE — ED Notes (Signed)
Pt discharged. Pt stable at time of discharge. Medications reviewed pt has no questions regarding discharge at this time. Pt voiced understanding of discharge instructions.  

## 2012-05-31 ENCOUNTER — Other Ambulatory Visit: Payer: Self-pay | Admitting: Gastroenterology

## 2012-06-01 ENCOUNTER — Encounter: Payer: Self-pay | Admitting: Gastroenterology

## 2012-06-01 NOTE — Telephone Encounter (Signed)
Needs office visit for f/u of GERD. Overdue.

## 2012-07-28 ENCOUNTER — Other Ambulatory Visit: Payer: Self-pay | Admitting: Gastroenterology

## 2012-10-11 ENCOUNTER — Ambulatory Visit (INDEPENDENT_AMBULATORY_CARE_PROVIDER_SITE_OTHER): Payer: Self-pay | Admitting: Family Medicine

## 2012-10-11 ENCOUNTER — Encounter: Payer: Self-pay | Admitting: Family Medicine

## 2012-10-11 VITALS — BP 120/88 | HR 68 | Temp 98.3°F | Ht 72.0 in | Wt 231.8 lb

## 2012-10-11 DIAGNOSIS — M549 Dorsalgia, unspecified: Secondary | ICD-10-CM

## 2012-10-11 LAB — POCT URINALYSIS DIPSTICK: pH, UA: 7.5

## 2012-10-11 NOTE — Progress Notes (Signed)
  Subjective:    Patient ID: Jeffrey [name], male    DOB: Nov 26, 1970, 42 y.o.   MRN: [id number]  HPI Patient here for left rib pain. No other concerns. This came on a couple days ago got worse today he's had intermittent repetitive episodes of this left-sided pain for several years Patient states by the end of the evening his urine looks dark but he does not see any blood he denies abdominal pain but sometimes has epigastric discomfort he has had his gallbladder out in the past he's also had thorough cardiac workup which was negative had MRI of his lumbar spine which did in fact show some bulging of the gas this was back in 2009.  Pay states the pain is constant it isn't everyday pain it's worse with certain movements and rotations. Does not radiate down the arms or legs. Occasionally he does get lower back pain that radiates down the left leg. He does have very physical job. He tries anti-inflammatories for this he does not want pain medicines and he would prefer not to have any surgeries. PMH benign   Review of Systems see above no shortness of breath with this although the pain is worse with a deep breath with certain movements     Objective:   Physical Exam  Lungs are clear no crackles heart is regular pulse normal blood pressure is good subjective discomfort in the left paraspinal muscles and rotating around the left side.      Assessment & Plan:  Radicular pain probably related to a thoracic nerve impingement. Anti-inflammatory. Patient does not want narcotics. In addition to this I talked with the patient at length about what could be done he could continue conservative measures he does not want surgery but he would like to have an opinion if injections in his back might help. We will help set him up with Dr. Laurian [name] in [place]. In addition to this they may want him to do a up-to-date MRI but I will leave that decision to them

## 2012-12-19 ENCOUNTER — Telehealth: Payer: Self-pay | Admitting: Family Medicine

## 2012-12-19 NOTE — Telephone Encounter (Signed)
FYI - just found out today that pt had appt 11/17/12 w/Dr. Laurian Brim for possible injections (which he requested specifically) and he "No Showed"

## 2012-12-19 NOTE — Telephone Encounter (Signed)
Noted  

## 2013-01-19 ENCOUNTER — Other Ambulatory Visit: Payer: Self-pay

## 2013-01-19 ENCOUNTER — Emergency Department (HOSPITAL_COMMUNITY)
Admission: EM | Admit: 2013-01-19 | Discharge: 2013-01-19 | Disposition: A | Discharge status: Home / Self Care | Payer: Self-pay | Attending: Emergency Medicine | Admitting: Emergency Medicine

## 2013-01-19 ENCOUNTER — Encounter (HOSPITAL_COMMUNITY): Payer: Self-pay | Admitting: Emergency Medicine

## 2013-01-19 DIAGNOSIS — K219 Gastro-esophageal reflux disease without esophagitis: Secondary | ICD-10-CM | POA: Insufficient documentation

## 2013-01-19 DIAGNOSIS — Z87891 Personal history of nicotine dependence: Secondary | ICD-10-CM | POA: Insufficient documentation

## 2013-01-19 DIAGNOSIS — R112 Nausea with vomiting, unspecified: Secondary | ICD-10-CM | POA: Insufficient documentation

## 2013-01-19 DIAGNOSIS — R072 Precordial pain: Secondary | ICD-10-CM | POA: Insufficient documentation

## 2013-01-19 DIAGNOSIS — R61 Generalized hyperhidrosis: Secondary | ICD-10-CM | POA: Insufficient documentation

## 2013-01-19 DIAGNOSIS — R109 Unspecified abdominal pain: Secondary | ICD-10-CM | POA: Insufficient documentation

## 2013-01-19 DIAGNOSIS — R42 Dizziness and giddiness: Secondary | ICD-10-CM | POA: Insufficient documentation

## 2013-01-19 DIAGNOSIS — Z79899 Other long term (current) drug therapy: Secondary | ICD-10-CM | POA: Insufficient documentation

## 2013-01-19 HISTORY — DX: Gastro-esophageal reflux disease without esophagitis: K21.9

## 2013-01-19 LAB — CBC WITH DIFFERENTIAL/PLATELET
Basophils Absolute: 0 10*3/uL (ref 0.0–0.1)
Basophils Relative: 0 % (ref 0–1)
Eosinophils Absolute: 0.1 10*3/uL (ref 0.0–0.7)
Eosinophils Relative: 2 % (ref 0–5)
HCT: 39.3 % (ref 39.0–52.0)
Hemoglobin: 13.6 g/dL (ref 13.0–17.0)
Lymphocytes Relative: 27 % (ref 12–46)
Lymphs Abs: 0.8 10*3/uL (ref 0.7–4.0)
MCH: 31.4 pg (ref 26.0–34.0)
MCHC: 34.6 g/dL (ref 30.0–36.0)
MCV: 90.8 fL (ref 78.0–100.0)
Monocytes Absolute: 0.3 10*3/uL (ref 0.1–1.0)
Monocytes Relative: 12 % (ref 3–12)
Neutro Abs: 1.7 10*3/uL (ref 1.7–7.7)
Neutrophils Relative %: 60 % (ref 43–77)
Platelets: 145 10*3/uL — ABNORMAL LOW (ref 150–400)
RBC: 4.33 MIL/uL (ref 4.22–5.81)
RDW: 12.8 % (ref 11.5–15.5)
WBC: 2.9 10*3/uL — ABNORMAL LOW (ref 4.0–10.5)

## 2013-01-19 LAB — COMPREHENSIVE METABOLIC PANEL
ALT: 24 U/L (ref 0–53)
AST: 20 U/L (ref 0–37)
Albumin: 4.2 g/dL (ref 3.5–5.2)
Alkaline Phosphatase: 65 U/L (ref 39–117)
BUN: 13 mg/dL (ref 6–23)
CO2: 24 mEq/L (ref 19–32)
Calcium: 9.4 mg/dL (ref 8.4–10.5)
Chloride: 102 mEq/L (ref 96–112)
Creatinine, Ser: 0.73 mg/dL (ref 0.50–1.35)
GFR calc Af Amer: >90 mL/min (ref >90)
GFR calc non Af Amer: >90 mL/min (ref >90)
Glucose, Bld: 120 mg/dL — ABNORMAL HIGH (ref 70–99)
Potassium: 3.7 mEq/L (ref 3.5–5.1)
Sodium: 137 mEq/L (ref 135–145)
Total Bilirubin: 0.3 mg/dL (ref 0.3–1.2)
Total Protein: 7.7 g/dL (ref 6.0–8.3)

## 2013-01-19 LAB — LIPASE, BLOOD: Lipase: 30 U/L (ref 11–59)

## 2013-01-19 LAB — TROPONIN I: Troponin I: <0.3 ng/mL (ref <0.30)

## 2013-01-19 MED ORDER — ONDANSETRON HCL 4 MG PO TABS: 4.0000 mg | ORAL_TABLET | Freq: Three times a day (TID) | ORAL | Status: DC | PRN

## 2013-01-19 MED ORDER — MECLIZINE HCL 25 MG PO TABS: ORAL_TABLET | ORAL | Status: DC

## 2013-01-19 MED ORDER — ONDANSETRON HCL 4 MG/2ML IJ SOLN
4.0000 mg | Freq: Once | INTRAMUSCULAR | Status: AC
  Administered 2013-01-19: 4 mg via INTRAMUSCULAR
  Filled 2013-01-19: qty 2

## 2013-01-19 MED ORDER — ONDANSETRON 8 MG PO TBDP: 8.0000 mg | ORAL_TABLET | Freq: Once | ORAL | Status: DC

## 2013-01-19 MED ORDER — MECLIZINE HCL 12.5 MG PO TABS
25.0000 mg | ORAL_TABLET | Freq: Once | ORAL | Status: AC
  Administered 2013-01-19: 25 mg via ORAL
  Filled 2013-01-19: qty 2

## 2013-01-19 MED ORDER — LORAZEPAM 1 MG PO TABS
0.5000 mg | ORAL_TABLET | Freq: Once | ORAL | Status: AC
  Administered 2013-01-19: 0.5 mg via ORAL
  Filled 2013-01-19: qty 1

## 2013-01-19 NOTE — Progress Notes (Signed)
ED/CM noted patient did not have health insurance and/or PCP listed in the computer.  Patient was given the [place] resource handout with information on the clinics, food pantries, and the handout for new health insurance sign-up.  Patient expressed appreciation for this. 

## 2013-01-19 NOTE — ED Notes (Signed)
Pt. Woke up at 6 am this morning with dizziness. Patient tried to make it to the bathroom but felt like he was going to pass out. Patient feels nauseous but has had no episodes of vomiting. Epigastric pain started this morning and wraps around to the back. Patient is not diaphoretic. Alert and oriented. Denies any SOB

## 2013-01-19 NOTE — ED Provider Notes (Signed)
CSN: [id number]     Arrival date & time 01/19/13  0803 History  This chart was scribed for Ward Givens, MD by Quintella Reichert, ED scribe.  This patient was seen in room APA04/APA04 and the patient's care was started at 8:32 AM.   Chief Complaint  Patient presents with  . Dizziness  . Abdominal Pain  . Nausea    The history is provided by the patient and the spouse. No language interpreter was used.    HPI Comments: Jeffrey [name] is a 42 y.o. male who presents to the Emergency Department complaining of severe waxing-and-waning lightheadedness and dizziness that began on waking this morning.  Pt states he woke up at 6 AM and "felt like my head was spinning really really fast" and as if he would pass out if he did not move.  This resolved in a few seconds.  He got out of bed to get ready for work and when he began bending over the tub to get his hair wet to brush his hair he again developed the same symptoms which would recur whenever he bent his head forward and improve when he straightened up.  He then developed nausea, diaphoresis and substernal chest "discomfort" although he notes the latter is a longstanding frequent issue for him for several years.  He has also been belching which eases this discomfort slightly.  He has vomited one time.  Presently he states he still feels dizzy and lightheaded whenever he turns his head from left to right, vice versa or looks up.   He states that when he walks he does not feel like he is going to fall but "I can tell it wouldn't take very much to tip me over."  Wife states he has been walking but very slowly.  Pt denies HA, syncope, numbness or tingling in legs, focal weakness, or recent illness or URI symptoms.  He states he has otherwise been doing well recently and felt well when he went to bed last night.  He denies prior h/o similar symptoms.    PCP is Dr. Lilyan Punt.  Pt does not smoke.  He works on Engineer, drilling.   Past Medical History  Diagnosis  Date  . GERD (gastroesophageal reflux disease)      Past Surgical History  Procedure Laterality Date  . Cholecystectomy    . Esophagogastroduodenoscopy endoscopy    . Cardiac stress test      No family history on file.   History  Substance Use Topics  . Smoking status: Former Games developer  . Smokeless tobacco: Current User  . Alcohol Use: 7.2 oz/week    12 Cans of beer per week   lives at home Lives with spouse employed  Review of Systems  Constitutional: Positive for diaphoresis. Negative for fever.  HENT: Negative for congestion, rhinorrhea and sore throat.   Respiratory: Negative for cough.   Cardiovascular: Positive for chest pain (chronic).  Gastrointestinal: Positive for nausea and vomiting.  Neurological: Positive for dizziness and light-headedness. Negative for syncope, weakness, numbness and headaches.  All other systems reviewed and are negative.     Allergies  Review of patient's allergies indicates no known allergies.  Home Medications   Current Outpatient Rx  Name  Route  Sig  Dispense  Refill  . albuterol (PROVENTIL HFA;VENTOLIN HFA) 108 (90 BASE) MCG/ACT inhaler   Inhalation   Inhale 2 puffs into the lungs every 4 (four) hours as needed for wheezing or shortness of breath.  1 Inhaler   3   . HYDROcodone-acetaminophen (NORCO/VICODIN) 5-325 MG per tablet   Oral   Take 2 tablets by mouth every 4 (four) hours as needed for pain.   10 tablet   0   . Multiple Vitamin (MULTIVITAMIN WITH MINERALS) TABS   Oral   Take 1 tablet by mouth daily.         . naproxen (NAPROSYN) 500 MG tablet   Oral   Take 1 tablet (500 mg total) by mouth 2 (two) times daily with a meal.   30 tablet   0   . Omega-3 Fatty Acids (FISH OIL TRIPLE STRENGTH) 1400 MG CAPS   Oral   Take 1 capsule by mouth every other day.         Marland Kitchen omeprazole (PRILOSEC) 20 MG capsule   Oral   Take 20 mg by mouth daily.          Marland Kitchen omeprazole (PRILOSEC) 20 MG capsule      TAKE ONE  CAPSULE BY MOUTH TWICE DAILY 30 MINUTES PRIOR TO BREAKFAST AND SUPPER   60 capsule   3   . oseltamivir (TAMIFLU) 75 MG capsule   Oral   Take 1 capsule (75 mg total) by mouth every 12 (twelve) hours.   10 capsule   0    BP 121/72  Pulse 70  Temp(Src) 97.6 F (36.4 C)  Resp 15  Ht 6' (1.829 m)  Wt 225 lb (102.059 kg)  BMI 30.51 kg/m2  SpO2 97%  Vital signs normal    Physical Exam  Nursing note and vitals reviewed. Constitutional: He is oriented to person, place, and time. He appears well-developed and well-nourished.  Non-toxic appearance. He does not appear ill. No distress.  HENT:  Head: Normocephalic and atraumatic.  Right Ear: External ear normal.  Left Ear: External ear normal.  Nose: Nose normal. No mucosal edema or rhinorrhea.  Mouth/Throat: Oropharynx is clear and moist and mucous membranes are normal. No dental abscesses or uvula swelling.  Eyes: Conjunctivae and EOM are normal. Pupils are equal, round, and reactive to light.  No obvious nystagmus  Neck: Normal range of motion and full passive range of motion without pain. Neck supple.  Cardiovascular: Normal rate, regular rhythm and normal heart sounds.  Exam reveals no gallop and no friction rub.   No murmur heard. Pulmonary/Chest: Effort normal and breath sounds normal. No respiratory distress. He has no wheezes. He has no rhonchi. He has no rales. He exhibits no tenderness and no crepitus.    He indicates his pain in his chest is over the xyphoid process, nontender to palpation. Area of pain noted  Abdominal: Soft. Normal appearance and bowel sounds are normal. He exhibits no distension. There is no tenderness. There is no rebound and no guarding.  Musculoskeletal: Normal range of motion. He exhibits no edema and no tenderness.  Moves all extremities well.   Neurological: He is alert and oriented to person, place, and time. He has normal strength. No cranial nerve deficit.  Equal grips No pronator drift  bilaterally FTN equal bilaterally No weakness in UE or LE  Skin: Skin is warm, dry and intact. No rash noted. No erythema. No pallor.  Psychiatric: He has a normal mood and affect. His speech is normal and behavior is normal. His mood appears not anxious.    ED Course  Procedures (including critical care time)  Medications  ondansetron (ZOFRAN) injection 4 mg (4 mg Intramuscular Given 01/19/13 0849)  meclizine (  ANTIVERT) tablet 25 mg (25 mg Oral Given 01/19/13 0854)  meclizine (ANTIVERT) tablet 25 mg (25 mg Oral Given 01/19/13 0947)  LORazepam (ATIVAN) tablet 0.5 mg (0.5 mg Oral Given 01/19/13 0948)     DIAGNOSTIC STUDIES: Oxygen Saturation is 97% on room air, normal by my interpretation.    COORDINATION OF CARE: 8:43 AM-Discussed treatment plan which includes Antivert, Zofran, EKG and labs with pt at bedside and pt agreed to plan.   Review of his doctors office notes shows patient has had this chest pain for years. He had a MR of his thoracic spine in 2009 showing mild bulging discs. Most recently he was scheduled to get steroid injections in his T spine, but he missed his appointment.   9:33 AM-Pt states he is still feeling nauseated.  Informed pt that labs and cardiac workup are normal.  Discussed treatment plan which includes Ativan with pt at bedside and pt agreed to plan.   10:49 AM-On recheck pt states his dizziness is "better but it's still there."  He states that he no longer has a spinning sensation but when he tries to move he has an "unlevel feeling."  He has not yet attempted to walk.  Discussed treatment plan which includes attempting ambulation with pt at bedside and pt agreed to plan.   Pt ambulated by nursing staff and was noted to be steady on his feet.    Labs Review Results for orders placed during the hospital encounter of 01/19/13  CBC WITH DIFFERENTIAL      Result Value Range   WBC 2.9 (*) 4.0 - 10.5 K/uL   RBC 4.33  4.22 - 5.81 MIL/uL   Hemoglobin 13.6   13.0 - 17.0 g/dL   HCT 16.1  09.6 - 04.5 %   MCV 90.8  78.0 - 100.0 fL   MCH 31.4  26.0 - 34.0 pg   MCHC 34.6  30.0 - 36.0 g/dL   RDW 40.9  81.1 - 91.4 %   Platelets 145 (*) 150 - 400 K/uL   Neutrophils Relative % 60  43 - 77 %   Neutro Abs 1.7  1.7 - 7.7 K/uL   Lymphocytes Relative 27  12 - 46 %   Lymphs Abs 0.8  0.7 - 4.0 K/uL   Monocytes Relative 12  3 - 12 %   Monocytes Absolute 0.3  0.1 - 1.0 K/uL   Eosinophils Relative 2  0 - 5 %   Eosinophils Absolute 0.1  0.0 - 0.7 K/uL   Basophils Relative 0  0 - 1 %   Basophils Absolute 0.0  0.0 - 0.1 K/uL  TROPONIN I      Result Value Range   Troponin I <0.30  <0.30 ng/mL  COMPREHENSIVE METABOLIC PANEL      Result Value Range   Sodium 137  135 - 145 mEq/L   Potassium 3.7  3.5 - 5.1 mEq/L   Chloride 102  96 - 112 mEq/L   CO2 24  19 - 32 mEq/L   Glucose, Bld 120 (*) 70 - 99 mg/dL   BUN 13  6 - 23 mg/dL   Creatinine, Ser 7.82  0.50 - 1.35 mg/dL   Calcium 9.4  8.4 - 95.6 mg/dL   Total Protein 7.7  6.0 - 8.3 g/dL   Albumin 4.2  3.5 - 5.2 g/dL   AST 20  0 - 37 U/L   ALT 24  0 - 53 U/L   Alkaline Phosphatase 65  39 -  117 U/L   Total Bilirubin 0.3  0.3 - 1.2 mg/dL   GFR calc non Af Amer >90  >90 mL/min   GFR calc Af Amer >90  >90 mL/min  LIPASE, BLOOD      Result Value Range   Lipase 30  11 - 59 U/L   Laboratory interpretation all normal   Imaging Review No results found.     2009 MRI THORACIC SPINE WITHOUT CONTRAST   IMPRESSION:  1. Small left paracentral disc protrusion C7-T1.  2. Tiny right paracentral disc protrusion and T1-T2.  3. Shallow left posterolateral disc bulge T3-T4.  Provider: Gareth Morgan        EKG Interpretation   None       Date: 01/19/2013  Rate: 72  Rhythm: normal sinus rhythm  QRS Axis: normal  Intervals: normal  ST/T Wave abnormalities: normal  Conduction Disutrbances:none  Narrative Interpretation:   Old EKG Reviewed: unchanged from 02/19/2012    MDM   1. Vertigo      Discharge Medication List as of 01/19/2013 11:34 AM    START taking these medications   Details  meclizine (ANTIVERT) 25 MG tablet Take 1 or 2 po Q 6hrs for dizziness, Print    ondansetron (ZOFRAN) 4 MG tablet Take 1 tablet (4 mg total) by mouth every 8 (eight) hours as needed for nausea or vomiting (or dizziness)., Starting 01/19/2013, Until Discontinued, Print        Plan discharge   Devoria Albe, MD, FACEP   I personally performed the services described in this documentation, which was scribed in my presence. The recorded information has been reviewed and considered.    Ward Givens, MD 01/19/13 1450

## 2013-02-07 ENCOUNTER — Encounter: Payer: Self-pay | Admitting: Gastroenterology

## 2013-03-20 ENCOUNTER — Other Ambulatory Visit: Payer: Self-pay

## 2013-03-20 NOTE — Telephone Encounter (Signed)
Patient last seen 02/2010. Has to get from PCP or make ov here.

## 2013-04-03 ENCOUNTER — Ambulatory Visit (INDEPENDENT_AMBULATORY_CARE_PROVIDER_SITE_OTHER): Payer: Self-pay | Admitting: Family Medicine

## 2013-04-03 ENCOUNTER — Encounter: Payer: Self-pay | Admitting: Family Medicine

## 2013-04-03 VITALS — BP 132/78 | Temp 100.0°F | Ht 72.0 in | Wt 236.0 lb

## 2013-04-03 DIAGNOSIS — K5732 Diverticulitis of large intestine without perforation or abscess without bleeding: Secondary | ICD-10-CM

## 2013-04-03 DIAGNOSIS — R109 Unspecified abdominal pain: Secondary | ICD-10-CM

## 2013-04-03 MED ORDER — CIPROFLOXACIN HCL 500 MG PO TABS: 500.0000 mg | ORAL_TABLET | Freq: Two times a day (BID) | ORAL | Status: DC

## 2013-04-03 MED ORDER — METRONIDAZOLE 500 MG PO TABS: 500.0000 mg | ORAL_TABLET | Freq: Three times a day (TID) | ORAL | Status: AC

## 2013-04-03 NOTE — Progress Notes (Signed)
   Subjective:    Patient ID: Jeffrey [name], male    DOB: 1970/05/21, 43 y.o.   MRN: [id number]  Abdominal Pain This is a new problem. The current episode started in the past 7 days. The pain is located in the LUQ. The abdominal pain radiates to the back. Associated symptoms include a fever and nausea.   Saturday pain in left abd. Sore and achey,  Some chills with fever Moderate nausea hasm't ate as well today No cough Slight sore throat Normal BM today, no blood  PMH benign  Review of Systems  Constitutional: Positive for fever.  Gastrointestinal: Positive for nausea and abdominal pain.   No wheezing no chest    Objective:   Physical Exam Lungs are clear hearts regular prostate exam is normal mild tenderness in the left lower cord but no guarding or rebound       Assessment & Plan:  Probable diverticulitis. No need for any scan at this point. I talked with the patient I told him that going ahead with Flagyl and Cipro it if he is not better in 48 hours then we can progress forward with doing a scan he stated that that sounded fine to him. If he has progressive troubles he will followup accordingly. Warning signs were discussed in detail. If he starts having severe pain vomiting bloody stools immediately call us or go to ER

## 2013-04-03 NOTE — Patient Instructions (Signed)
Diverticulitis °A diverticulum is a small pouch or sac on the colon. Diverticulosis is the presence of these diverticula on the colon. Diverticulitis is the irritation (inflammation) or infection of diverticula. °CAUSES  °The colon and its diverticula contain bacteria. If food particles block the tiny opening to a diverticulum, the bacteria inside can grow and cause an increase in pressure. This leads to infection and inflammation and is called diverticulitis. °SYMPTOMS  °· Abdominal pain and tenderness. Usually, the pain is located on the left side of your abdomen. However, it could be located elsewhere. °· Fever. °· Bloating. °· Feeling sick to your stomach (nausea). °· Throwing up (vomiting). °· Abnormal stools. °DIAGNOSIS  °Your caregiver will take a history and perform a physical exam. Since many things can cause abdominal pain, other tests may be necessary. Tests may include: °· Blood tests. °· Urine tests. °· X-ray of the abdomen. °· CT scan of the abdomen. °Sometimes, surgery is needed to determine if diverticulitis or other conditions are causing your symptoms. °TREATMENT  °Most of the time, you can be treated without surgery. Treatment includes: °· Resting the bowels by only having liquids for a few days. As you improve, you will need to eat a low-fiber diet. °· Intravenous (IV) fluids if you are losing body fluids (dehydrated). °· Antibiotic medicines that treat infections may be given. °· Pain and nausea medicine, if needed. °· Surgery if the inflamed diverticulum has burst. °HOME CARE INSTRUCTIONS  °· Try a clear liquid diet (broth, tea, or water for as long as directed by your caregiver). You may then gradually begin a low-fiber diet as tolerated.  °A low-fiber diet is a diet with less than 10 grams of fiber. Choose the foods below to reduce fiber in the diet: °· White breads, cereals, rice, and pasta. °· Cooked fruits and vegetables or soft fresh fruits and vegetables without the skin. °· Ground or  well-cooked tender beef, ham, veal, lamb, pork, or poultry. °· Eggs and seafood. °· After your diverticulitis symptoms have improved, your caregiver may put you on a high-fiber diet. A high-fiber diet includes 14 grams of fiber for every 1000 calories consumed. For a standard 2000 calorie diet, you would need 28 grams of fiber. Follow these diet guidelines to help you increase the fiber in your diet. It is important to slowly increase the amount fiber in your diet to avoid gas, constipation, and bloating. °· Choose whole-grain breads, cereals, pasta, and brown rice. °· Choose fresh fruits and vegetables with the skin on. Do not overcook vegetables because the more vegetables are cooked, the more fiber is lost. °· Choose more nuts, seeds, legumes, dried peas, beans, and lentils. °· Look for food products that have greater than 3 grams of fiber per serving on the Nutrition Facts label. °· Take all medicine as directed by your caregiver. °· If your caregiver has given you a follow-up appointment, it is very important that you go. Not going could result in lasting (chronic) or permanent injury, pain, and disability. If there is any problem keeping the appointment, call to reschedule. °SEEK MEDICAL CARE IF:  °· Your pain does not improve. °· You have a hard time advancing your diet beyond clear liquids. °· Your bowel movements do not return to normal. °SEEK IMMEDIATE MEDICAL CARE IF:  °· Your pain becomes worse. °· You have an oral temperature above 102° F (38.9° C), not controlled by medicine. °· You have repeated vomiting. °· You have bloody or black, tarry stools. °·   Symptoms that brought you to your caregiver become worse or are not getting better. °MAKE SURE YOU:  °· Understand these instructions. °· Will watch your condition. °· Will get help right away if you are not doing well or get worse. °Document Released: 12/03/2004 Document Revised: 05/18/2011 Document Reviewed: 03/31/2010 °[employer]® Patient Information  ©2014 [employer]. ° °

## 2013-06-12 ENCOUNTER — Ambulatory Visit (INDEPENDENT_AMBULATORY_CARE_PROVIDER_SITE_OTHER): Payer: BC Managed Care – PPO | Admitting: Family Medicine

## 2013-06-12 ENCOUNTER — Encounter: Payer: Self-pay | Admitting: Family Medicine

## 2013-06-12 VITALS — BP 122/80 | Temp 98.5°F | Ht 72.0 in | Wt 231.0 lb

## 2013-06-12 DIAGNOSIS — IMO0002 Reserved for concepts with insufficient information to code with codable children: Secondary | ICD-10-CM

## 2013-06-12 DIAGNOSIS — M792 Neuralgia and neuritis, unspecified: Secondary | ICD-10-CM

## 2013-06-12 DIAGNOSIS — M7918 Myalgia, other site: Secondary | ICD-10-CM

## 2013-06-12 DIAGNOSIS — IMO0001 Reserved for inherently not codable concepts without codable children: Secondary | ICD-10-CM

## 2013-06-12 MED ORDER — NORTRIPTYLINE HCL 25 MG PO CAPS: ORAL_CAPSULE | ORAL | Status: DC

## 2013-06-12 NOTE — Progress Notes (Signed)
   Subjective:    Patient ID: Jeffrey [name], male    DOB: 01-13-1971, 43 y.o.   MRN: [id number]  Back Pain This is a recurrent problem. The problem occurs every several days. The problem has been gradually worsening since onset. Pain location: Under left shoulder blade. Radiates to: Neck and head. The pain is worse during the night. The symptoms are aggravated by sitting. Associated symptoms include abdominal pain and headaches. He has tried NSAIDs for the symptoms. The treatment provided mild relief.   PMH has had extensive testing of the thoracic spine that showed some bulging back in 2009 also has had gallbladder removed and endoscopy use stress test as well.   Review of Systems  Gastrointestinal: Positive for abdominal pain.  Musculoskeletal: Positive for back pain.  Neurological: Positive for headaches.       Objective:   Physical Exam  Lungs clear hearts regular abdomen soft no guarding or rebound or tenderness no masses are felt subjective discomfort in the left rhomboid area. Reflexes good.      Assessment & Plan:  Musculoskeletal pain try nortriptyline for radiculopathy pain if this does not help to notify us. Over the next month if doing much better no need to do other tests if not doing better then referral to musculoskeletal specialist. (possibly Dr Nelva Bush)

## 2013-06-12 NOTE — Patient Instructions (Signed)
In one month call us with update   If worse before then call

## 2013-07-18 ENCOUNTER — Telehealth: Payer: Self-pay

## 2013-07-18 NOTE — Telephone Encounter (Signed)
PLEASE CALL PT. WE CAN PUT HIM ON A CANCELLATION LIST AND IF SOMEONE CANCELS WE CAN CALL HIM. IF HE IS HAVING ABD PAIN AND DIARRHEA SINCE HAVING HIS GALLBLADDER OUT, HE SHOULD CALL THE SURGEON. HE CAN ALSO CHEW ONE TUMS WITH MEALS THREE TIMES A DAY. HE SHOULD STRICTLY FOLLOW A LOW FAT DIET.

## 2013-07-18 NOTE — Telephone Encounter (Signed)
He is having abd pain. He is also having diarrhea since his gallbladder has been taken out. His back is hurting in the middle. He think that his Laray Anger is flaring up. He is wanting to know if he needs to be seen sooner then June 17. Please advise

## 2013-07-18 NOTE — Telephone Encounter (Signed)
Tried to call with no answer  

## 2013-08-17 NOTE — Telephone Encounter (Signed)
Routing to [place] for f/u since Ginger is out.

## 2013-08-17 NOTE — Telephone Encounter (Signed)
Called and many rings and no answer.

## 2013-08-21 NOTE — Telephone Encounter (Signed)
Spoke with pt, he is doing some better. He is aware of appt with Dr. Oneida Alar on 08/23/2013 at 2:00 PM.

## 2013-08-23 ENCOUNTER — Encounter: Payer: Self-pay | Admitting: Gastroenterology

## 2013-08-23 ENCOUNTER — Other Ambulatory Visit: Payer: Self-pay | Admitting: Gastroenterology

## 2013-08-23 ENCOUNTER — Ambulatory Visit (INDEPENDENT_AMBULATORY_CARE_PROVIDER_SITE_OTHER): Payer: BC Managed Care – PPO | Admitting: Gastroenterology

## 2013-08-23 ENCOUNTER — Encounter (INDEPENDENT_AMBULATORY_CARE_PROVIDER_SITE_OTHER): Payer: Self-pay

## 2013-08-23 VITALS — BP 126/78 | HR 87 | Temp 97.9°F | Resp 20 | Ht 74.0 in | Wt 226.0 lb

## 2013-08-23 DIAGNOSIS — R1013 Epigastric pain: Secondary | ICD-10-CM

## 2013-08-23 DIAGNOSIS — Z86010 Personal history of colonic polyps: Secondary | ICD-10-CM

## 2013-08-23 DIAGNOSIS — D126 Benign neoplasm of colon, unspecified: Secondary | ICD-10-CM | POA: Insufficient documentation

## 2013-08-23 DIAGNOSIS — R634 Abnormal weight loss: Secondary | ICD-10-CM

## 2013-08-23 MED ORDER — ESOMEPRAZOLE MAGNESIUM 40 MG PO CPDR: 40.0000 mg | DELAYED_RELEASE_CAPSULE | Freq: Two times a day (BID) | ORAL | Status: DC

## 2013-08-23 NOTE — Assessment & Plan Note (Signed)
EAT FIBER FULL LIQUID DIET JUL 13. TCS JUL 14 ALL SISTERS, BROTHERS, CHILDREN, AND PARENTS NEED TO HAVE A COLONOSCOPY STARTING AT THE AGE OF 30 and then every 5 years

## 2013-08-23 NOTE — Patient Instructions (Addendum)
INCREASE NEXIUM. TAKE 30 MINUTES BEFORE MEALS BID.  FOLLOW A LOW FAT DIET. FOOD SHOULD BE BAKED, BROILED, GRILLED, OR BOILED.  MINIMIZE YOUR ALCOHOL USE.  FULL LIQUID DIET JUL 13. SEE INFO BELOW. LOWER AND UPPER ENDOSCOPY/POSSIBLE ESOPHAGEAL DILATION JUL 14.  FOLLOW UP IN 4 MOS.    Full Liquid Diet   Breads and Starches  Allowed: None are allowed except crackers WHOLE OR pureed (made into a thick, smooth soup) in soup.   Avoid: Any others.    Vegetables  Allowed: Strained tomato or vegetable juice. Vegetables pureed in soup.   Avoid: Any others.    Fruit  Allowed: Any strained fruit juices and fruit drinks. Include 1 serving of citrus or vitamin C-enriched fruit juice daily.   Avoid: Any others.  Meat and Meat Substitutes  Allowed: Egg  Avoid: Any meat, fish, or fowl. All cheese.  Milk  Allowed: Milk beverages, including milk shakes and instant breakfast mixes. Smooth yogurt.   Avoid: Any others. Avoid dairy products if not tolerated.    Soups and Combination Foods  Allowed: Broth, strained cream soups. Strained, broth-based soups.   Avoid: Any others.    Desserts and Sweets  Allowed: flavored gelatin, plain ice cream, sherbet, smooth pudding, frozen ice pops, pudding pops,, frozen fudge pops, chocolate syrup. Sugar, honey, jelly, syrup.   Avoid: Any others.  Fats and Oils  Allowed: Margarine, butter, cream, sour cream, oils.   Avoid: Any others.  Beverages  Allowed: All.   Avoid: None.  Condiments  Allowed: Iodized salt, pepper, spices, flavorings. Cocoa powder.   Avoid: Any others.    SAMPLE MEAL PLAN Breakfast   cup orange juice.   1 cup  milk.   1 cup beverage (coffee or tea).   Cream or sugar, if desired.    Midmorning Snack  2 SCRAMBLED OR HARD BOILED EGG   Lunch  1 cup cream soup.    cup fruit juice.   1 cup milk.    cup custard.   1 cup beverage (coffee or tea).   Cream or sugar, if desired.     Midafternoon Snack  1 cup milk shake.  Dinner  1 cup cream soup.    cup fruit juice.   1 cup milk.    cup pudding.   1 cup beverage (coffee or tea).   Cream or sugar, if desired.  Evening Snack  1 cup supplement.

## 2013-08-23 NOTE — Assessment & Plan Note (Signed)
Most likely due to uncontrolled GERD/STRICTURE, LESS LIKELY CHRONIC PANCREATITIS, H PYLORI GASTRITIS, OR GE JXN TUMOR.  INCREASE NEXIUM. TAKE 30 MINUTES BEFORE MEALS BID. LOW FAT DIET MINIMIZE ETOH USE EGD/?DIL JUL 14 FOLLOW UP IN 4 MOS.

## 2013-08-23 NOTE — Progress Notes (Signed)
   Subjective:    Patient ID: Jeffrey [name], male    DOB: 03/15/1970, 43 y.o.   MRN: [id number]  Sallee Lange, MD  HPI Reflux never really right. Still has times can use bathroom: watery/solid. MAY RUN RIGHT THROUGH HIM. CAN GET TO HURTING IN HIS EPIGASTRIUM. NOTICES IN EVENING AND LAST FOR 1-2 HRS. PAIN IN EPIGASTRIUM RADIATING TO HIS BACK: 3-4 DAYS A WEEK. NO REAL TRIGGERS. STILL DRINKING ETOH. SMOKES SOME POT. MAY CAUSE PAIN IN MID ABD RADIATING TO BACK. FEELS NOT HIMSELF BUT NO VOMITING. BMs: 1-0-4-5. RARE FEELING BURNING CHEST PAIN AND FOOD BUBBLES BACK UP. MAY FEEL LIKE FACE IS RED AND HOT BUT NOT ASSOCIATED WITH PALPITATIONS OR DIZZINESS. WATERY STOOLS OFF AND ON BUT CONSTIPATION IS MORE COMMON. SX NO WORSE THAN WERE BACK IN 2011. PT DENIES FEVER, CHILLS, HEMATOCHEZIA,  vomiting, melena, SHORTNESS OF BREATH, CHANGE IN BOWEL IN HABITS, constipation, problems swallowing, OR problems with sedation.    Past Medical History  Diagnosis Date  . GERD (gastroesophageal reflux disease)    Past Surgical History  Procedure Laterality Date  . Cholecystectomy    . Esophagogastroduodenoscopy endoscopy    . Cardiac stress test     No Known Allergies  Current Outpatient Prescriptions  Medication Sig Dispense Refill  . albuterol (PROVENTIL HFA;VENTOLIN HFA) 108 (90 BASE) MCG/ACT inhaler Inhale 2 puffs into the lungs every 4 (four) hours as needed for wheezing or shortness of breath.    . esomeprazole (NEXIUM) 20 MG capsule Take 1 capsule (40 mg total) by mouth  daily before a meal.    . ibuprofen (ADVIL,MOTRIN) 200 MG tablet Take 400 mg by mouth every 8 (eight) hours as needed.    . vitamin C (ASCORBIC ACID) 500 MG tablet Take 500 mg by mouth daily.    . meclizine (ANTIVERT) 25 MG tablet Take 1 or 2 po Q 6hrs for dizziness    .      Marland Kitchen       Family History  Problem Relation Age of Onset  . Colon cancer Neg Hx   . Colon polyps Neg Hx     History  Substance Use Topics  . Smoking status:  Former Research scientist (life sciences)  . Smokeless tobacco: Current User  . Alcohol Use: 7.2 oz/week    12 Cans of beer per week        Review of Systems PER HPI OTHERWISE ALL SYSTEMS ARE NEGATIVE.     Objective:   Physical Exam  Vitals reviewed. Constitutional: He is oriented to person, place, and time. He appears well-developed and well-nourished. No distress.  HENT:  Head: Normocephalic and atraumatic.  Mouth/Throat: Oropharynx is clear and moist. No oropharyngeal exudate.  Eyes: Pupils are equal, round, and reactive to light. No scleral icterus.  Neck: Normal range of motion. Neck supple.  Cardiovascular: Normal rate, regular rhythm and normal heart sounds.   Pulmonary/Chest: Effort normal and breath sounds normal. No respiratory distress.  Abdominal: Soft. Bowel sounds are normal. He exhibits no distension. There is tenderness. There is no rebound and no guarding.  MILD TTP IN THE EPIGASTRIUM    Musculoskeletal: He exhibits no edema.  Lymphadenopathy:    He has no cervical adenopathy.  Neurological: He is alert and oriented to person, place, and time.  NO FOCAL DEFICITS   Psychiatric: He has a normal mood and affect.          Assessment & Plan:

## 2013-08-24 NOTE — Progress Notes (Signed)
cc'd to pcp 

## 2013-08-28 NOTE — Progress Notes (Signed)
Reminder in epic °

## 2013-09-06 ENCOUNTER — Encounter (HOSPITAL_COMMUNITY): Payer: Self-pay | Admitting: Pharmacy Technician

## 2013-09-19 ENCOUNTER — Encounter (HOSPITAL_COMMUNITY)
Admission: RE | Disposition: A | Discharge status: Home / Self Care | Payer: Self-pay | Source: Ambulatory Visit | Attending: Gastroenterology

## 2013-09-19 ENCOUNTER — Ambulatory Visit (HOSPITAL_COMMUNITY)
Admission: RE | Admit: 2013-09-19 | Discharge: 2013-09-19 | Disposition: A | Discharge status: Home / Self Care | Payer: BC Managed Care – PPO | Source: Ambulatory Visit | Attending: Gastroenterology | Admitting: Gastroenterology

## 2013-09-19 ENCOUNTER — Encounter (HOSPITAL_COMMUNITY): Payer: Self-pay | Admitting: *Deleted

## 2013-09-19 DIAGNOSIS — Q391 Atresia of esophagus with tracheo-esophageal fistula: Secondary | ICD-10-CM | POA: Insufficient documentation

## 2013-09-19 DIAGNOSIS — K3189 Other diseases of stomach and duodenum: Secondary | ICD-10-CM

## 2013-09-19 DIAGNOSIS — Z860100 Personal history of colon polyps, unspecified: Secondary | ICD-10-CM

## 2013-09-19 DIAGNOSIS — K311 Adult hypertrophic pyloric stenosis: Secondary | ICD-10-CM | POA: Insufficient documentation

## 2013-09-19 DIAGNOSIS — K2960 Other gastritis without bleeding: Secondary | ICD-10-CM

## 2013-09-19 DIAGNOSIS — R634 Abnormal weight loss: Secondary | ICD-10-CM

## 2013-09-19 DIAGNOSIS — Z87891 Personal history of nicotine dependence: Secondary | ICD-10-CM | POA: Insufficient documentation

## 2013-09-19 DIAGNOSIS — F1210 Cannabis abuse, uncomplicated: Secondary | ICD-10-CM | POA: Insufficient documentation

## 2013-09-19 DIAGNOSIS — F1010 Alcohol abuse, uncomplicated: Secondary | ICD-10-CM | POA: Insufficient documentation

## 2013-09-19 DIAGNOSIS — Q393 Congenital stenosis and stricture of esophagus: Secondary | ICD-10-CM

## 2013-09-19 DIAGNOSIS — R1013 Epigastric pain: Secondary | ICD-10-CM

## 2013-09-19 DIAGNOSIS — K644 Residual hemorrhoidal skin tags: Secondary | ICD-10-CM | POA: Insufficient documentation

## 2013-09-19 DIAGNOSIS — K5730 Diverticulosis of large intestine without perforation or abscess without bleeding: Secondary | ICD-10-CM | POA: Insufficient documentation

## 2013-09-19 DIAGNOSIS — K2990 Gastroduodenitis, unspecified, without bleeding: Secondary | ICD-10-CM

## 2013-09-19 DIAGNOSIS — D126 Benign neoplasm of colon, unspecified: Secondary | ICD-10-CM

## 2013-09-19 DIAGNOSIS — K2970 Gastritis, unspecified, without bleeding: Secondary | ICD-10-CM | POA: Insufficient documentation

## 2013-09-19 HISTORY — PX: MALONEY DILATION: SHX5535

## 2013-09-19 HISTORY — PX: ESOPHAGOGASTRODUODENOSCOPY: SHX5428

## 2013-09-19 HISTORY — PX: COLONOSCOPY: SHX5424

## 2013-09-19 HISTORY — PX: SAVORY DILATION: SHX5439

## 2013-09-19 SURGERY — COLONOSCOPY
Anesthesia: Moderate Sedation

## 2013-09-19 MED ORDER — MEPERIDINE HCL 100 MG/ML IJ SOLN
INTRAMUSCULAR | Status: DC | PRN
  Administered 2013-09-19: 50 mg
  Administered 2013-09-19: 25 mg via INTRAVENOUS
  Administered 2013-09-19: 50 mg

## 2013-09-19 MED ORDER — STERILE WATER FOR IRRIGATION IR SOLN
Status: DC | PRN
  Administered 2013-09-19: 10:00:00

## 2013-09-19 MED ORDER — SODIUM CHLORIDE 0.9 % IJ SOLN
INTRAMUSCULAR | Status: AC
  Filled 2013-09-19: qty 10

## 2013-09-19 MED ORDER — PROMETHAZINE HCL 25 MG/ML IJ SOLN
INTRAMUSCULAR | Status: AC
  Filled 2013-09-19: qty 1

## 2013-09-19 MED ORDER — MIDAZOLAM HCL 5 MG/5ML IJ SOLN
INTRAMUSCULAR | Status: AC
  Filled 2013-09-19: qty 10

## 2013-09-19 MED ORDER — SODIUM CHLORIDE 0.9 % IV SOLN
INTRAVENOUS | Status: DC
  Administered 2013-09-19: 10:00:00 via INTRAVENOUS

## 2013-09-19 MED ORDER — LIDOCAINE VISCOUS 2 % MT SOLN
OROMUCOSAL | Status: AC
  Filled 2013-09-19: qty 15

## 2013-09-19 MED ORDER — MIDAZOLAM HCL 5 MG/5ML IJ SOLN
INTRAMUSCULAR | Status: DC | PRN
  Administered 2013-09-19: 2 mg via INTRAVENOUS
  Administered 2013-09-19: 1 mg via INTRAVENOUS
  Administered 2013-09-19: 2 mg via INTRAVENOUS
  Administered 2013-09-19: 1 mg via INTRAVENOUS

## 2013-09-19 MED ORDER — ESOMEPRAZOLE MAGNESIUM 40 MG PO CPDR: 40.0000 mg | DELAYED_RELEASE_CAPSULE | Freq: Two times a day (BID) | ORAL | Status: DC

## 2013-09-19 MED ORDER — PROMETHAZINE HCL 25 MG/ML IJ SOLN
INTRAMUSCULAR | Status: DC | PRN
  Administered 2013-09-19: 25 mg via INTRAVENOUS

## 2013-09-19 MED ORDER — MEPERIDINE HCL 100 MG/ML IJ SOLN
INTRAMUSCULAR | Status: AC
  Filled 2013-09-19: qty 2

## 2013-09-19 NOTE — Interval H&P Note (Signed)
History and Physical Interval Note:  09/19/2013 9:09 AM  Jeffrey [name]  has presented today for surgery, with the diagnosis of DYSPEPSIA, WEIGHT LOSS, HISTORY OF ADVANCED ADENOMAS  The various methods of treatment have been discussed with the patient and family. After consideration of risks, benefits and other options for treatment, the patient has consented to  Procedure(s) with comments: COLONOSCOPY (N/A) - 10:00 - moved to 10:45 [name] to notify pt ESOPHAGOGASTRODUODENOSCOPY (EGD) (N/A) SAVORY DILATION (N/A) MALONEY DILATION (N/A) as a surgical intervention .  The patient's history has been reviewed, patient examined, no change in status, stable for surgery.  I have reviewed the patient's chart and labs.  Questions were answered to the patient's satisfaction.     [employer]

## 2013-09-19 NOTE — Op Note (Signed)
[facility] [address] [place], [address]   COLONOSCOPY PROCEDURE REPORT  PATIENT: Jeffrey, [name]  MR#: [id number] BIRTHDATE: 26-Dec-1970 , 42  yrs. old GENDER: Male ENDOSCOPIST: Barney Drain, MD REFERRED SE:GBTDV Wolfgang Phoenix, M.D. PROCEDURE DATE:  09/19/2013 PROCEDURE:   Colonoscopy with snare polypectomy INDICATIONS:High risk patient with personal history of colonic polyps-1.5 cm TVA IN 2011. MEDICATIONS: Promethazine (Phenergan) 25mg  IV, Demerol 125 mg IV, and Versed 5 mg IV  DESCRIPTION OF PROCEDURE:    Physical exam was performed.  Informed consent was obtained from the patient after explaining the benefits, risks, and alternatives to procedure.  The patient was connected to monitor and placed in left lateral position. Continuous oxygen was provided by nasal cannula and IV medicine administered through an indwelling cannula.  After administration of sedation and rectal exam, the patients rectum was intubated and the EC-[id number] ([id number])  colonoscope was advanced under direct visualization to the ileum.  The scope was removed slowly by carefully examining the color, texture, anatomy, and integrity mucosa on the way out.  The patient was recovered in endoscopy and discharged home in satisfactory condition.    COLON FINDINGS: The mucosa appeared normal in the terminal ileum.  , A sessile polyp measuring 6 mm in size was found in the descending colon.  A polypectomy was performed using snare cautery.  POLYP WAS NOT RETRIEVED BUT ABLATED VIA HOT SNARE. There was mild diverticulosis noted in the sigmoid colon with associated luminal narrowing.  , The mucosa appeared normal in the terminal ileum.  , and Moderate sized external hemorrhoids were found.  PREP QUALITY: good.  CECAL W/D TIME: 12 minutes     COMPLICATIONS: None  ENDOSCOPIC IMPRESSION: 1.   Normal mucosa in the terminal ileum 2.   ONE COLON POLYP REMOVED BUT NOT RETRIEVED. 3.   Mild  diverticulosis in the sigmoid colon 4.   Normal mucosa in the terminal ileum 5.   Moderate sized external hemorrhoids  RECOMMENDATIONS: HIGH FIBER DIET TCS IN 5 YEARS       _______________________________ eSignedBarney Drain, MD 09/19/2013 11:43 AM

## 2013-09-19 NOTE — Discharge Instructions (Addendum)
You had 1 polyp removed. You have internal hemorrhoids & DIVERTICULOSIS IN YOUR LEFT COLON. 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 I DILATED YOUR PYLORUS AND YOUR ESOPHAGUS. You gastritis DUE TO IBUPROFEN.  I biopsied your stomach.    FOLLOW RECOMMENDATIONS FOR CONTROLLING REFLUX. SEE INFO BELOW.  CONTINUE NEXIUM. TAKE 30 MINUTES BEFORE MEALS.  AVOID ITEMS THAT TRIGGER GASTRITIS. SEE INFO BELOW.  FOLLOW A HIGH FIBER/LOW FAT DIET. AVOID ITEMS THAT CAUSE BLOATING. SEE INFO BELOW.  YOUR BIOPSY RESULTS WILL BE BACK IN 7 DAYS.  FOLLOW UP IN OCT 2015.  Next colonoscopy in 5 years. ALL FIRST DEGREE RELATIVES NEED A COLONOSCOPY AT AGE 12 AND THEN EVERY 5 YEARS.   ENDOSCOPY Care After Read the instructions outlined below and refer to this sheet in the next week. These discharge instructions provide you with general information on caring for yourself after you leave the hospital. While your treatment has been planned according to the most current medical practices available, unavoidable complications occasionally occur. If you have any problems or questions after discharge, call DR. Aldo Sondgeroth, [phone].  ACTIVITY  You may resume your regular activity, but move at a slower pace for the next 24 hours.   Take frequent rest periods for the next 24 hours.   Walking will help get rid of the air and reduce the bloated feeling in your belly (abdomen).   No driving for 24 hours (because of the medicine (anesthesia) used during the test).   You may shower.   Do not sign any important legal documents or operate any machinery for 24 hours (because of the anesthesia used during the test).    NUTRITION  Drink plenty of fluids.   You may resume your normal diet as instructed by your doctor.   Begin with a light meal and progress to your normal diet. Heavy or fried foods are harder to digest and may make you feel sick to your stomach (nauseated).   Avoid alcoholic beverages  for 24 hours or as instructed.    MEDICATIONS  You may resume your normal medications.   WHAT YOU CAN EXPECT TODAY  Some feelings of bloating in the abdomen.   Passage of more gas than usual.   Spotting of blood in your stool or on the toilet paper  .  IF YOU HAD POLYPS REMOVED DURING THE ENDOSCOPY:  Eat a soft diet IF YOU HAVE NAUSEA, BLOATING, ABDOMINAL PAIN, OR VOMITING.    FINDING OUT THE RESULTS OF YOUR TEST Not all test results are available during your visit. DR. Oneida Alar WILL CALL YOU WITHIN 7 DAYS OF YOUR PROCEDUE WITH YOUR RESULTS. Do not assume everything is normal if you have not heard from DR. Krysti Hickling IN ONE WEEK, CALL HER OFFICE AT [phone].  SEEK IMMEDIATE MEDICAL ATTENTION AND CALL THE OFFICE: [phone] IF:  You have more than a spotting of blood in your stool.   Your belly is swollen (abdominal distention).   You are nauseated or vomiting.   You have a temperature over 101F.   You have abdominal pain or discomfort that is severe or gets worse throughout the day.    Lifestyle and home remedies TO HELP CONTROL REFLUX  You may eliminate or reduce the frequency of heartburn by making the following lifestyle changes:    Control your weight. Being overweight is a major risk factor for heartburn and GERD. Excess pounds put pressure on your abdomen, pushing up your stomach and causing acid to back up  into your esophagus.     Eat smaller meals. 4 TO 6 MEALS A DAY. This reduces pressure on the lower esophageal sphincter, helping to prevent the valve from opening and acid from washing back into your esophagus.     Loosen your belt. Clothes that fit tightly around your waist put pressure on your abdomen and the lower esophageal sphincter.     Eliminate heartburn triggers. Everyone has specific triggers. Common triggers such as fatty or fried foods, spicy food, tomato sauce, carbonated beverages, alcohol, chocolate, mint, garlic, onion, caffeine and nicotine  may make heartburn worse.     Avoid stooping or bending. Tying your shoes is OK. Bending over for longer periods to weed your garden isn't, especially soon after eating.     Don't lie down after a meal. Wait at least three to four hours after eating before going to bed, and don't lie down right after eating.    Alternative medicine   Several home remedies exist for treating GERD, but they provide only temporary relief. They include drinking baking soda (sodium bicarbonate) added to water or drinking other fluids such as baking soda mixed with cream of tartar and water.    Although these liquids create temporary relief by neutralizing, washing away or buffering acids, eventually they aggravate the situation by adding gas and fluid to your stomach, increasing pressure and causing more acid reflux. Further, adding more sodium to your diet may increase your blood pressure and add stress to your heart, and excessive bicarbonate ingestion can alter the acid-base balance in your body.  Gastritis  Gastritis is an inflammation (the body's way of reacting to injury and/or infection) of the stomach. It is often caused by viral or bacterial (germ) infections. It can also be caused BY ASPIRIN, BC/GOODY POWDER'S, (IBUPROFEN) MOTRIN, OR ALEVE (NAPROXEN), chemicals (including alcohol), SPICY FOODS, and medications. This illness may be associated with generalized malaise (feeling tired, not well), UPPER ABDOMINAL STOMACH cramps, and fever. One common bacterial cause of gastritis is an organism known as H. Pylori. This can be treated with antibiotics.      High-Fiber Diet A high-fiber diet changes your normal diet to include more whole grains, legumes, fruits, and vegetables. Changes in the diet involve replacing refined carbohydrates with unrefined foods. The calorie level of the diet is essentially unchanged. The Dietary Reference Intake (recommended amount) for adult males is 38 grams per day. For adult  females, it is 25 grams per day. Pregnant and lactating women should consume 28 grams of fiber per day. Fiber is the intact part of a plant that is not broken down during digestion. Functional fiber is fiber that has been isolated from the plant to provide a beneficial effect in the body. PURPOSE  Increase stool bulk.   Ease and regulate bowel movements.   Lower cholesterol.  INDICATIONS THAT YOU NEED MORE FIBER  Constipation and hemorrhoids.   Uncomplicated diverticulosis (intestine condition) and irritable bowel syndrome.   Weight management.   As a protective measure against hardening of the arteries (atherosclerosis), diabetes, and cancer.   GUIDELINES FOR INCREASING FIBER IN THE DIET  Start adding fiber to the diet slowly. A gradual increase of about 5 more grams (2 slices of whole-wheat bread, 2 servings of most fruits or vegetables, or 1 bowl of high-fiber cereal) per day is best. Too rapid an increase in fiber may result in constipation, flatulence, and bloating.   Drink enough water and fluids to keep your urine clear or pale yellow.  Water, juice, or caffeine-free drinks are recommended. Not drinking enough fluid may cause constipation.   Eat a variety of high-fiber foods rather than one type of fiber.   Try to increase your intake of fiber through using high-fiber foods rather than fiber pills or supplements that contain small amounts of fiber.   The goal is to change the types of food eaten. Do not supplement your present diet with high-fiber foods, but replace foods in your present diet.  INCLUDE A VARIETY OF FIBER SOURCES  Replace refined and processed grains with whole grains, canned fruits with fresh fruits, and incorporate other fiber sources. White rice, white breads, and most bakery goods contain little or no fiber.   Brown whole-grain rice, buckwheat oats, and many fruits and vegetables are all good sources of fiber. These include: broccoli, Brussels sprouts,  cabbage, cauliflower, beets, sweet potatoes, white potatoes (skin on), carrots, tomatoes, eggplant, squash, berries, fresh fruits, and dried fruits.   Cereals appear to be the richest source of fiber. Cereal fiber is found in whole grains and bran. Bran is the fiber-rich outer coat of cereal grain, which is largely removed in refining. In whole-grain cereals, the bran remains. In breakfast cereals, the largest amount of fiber is found in those with "bran" in their names. The fiber content is sometimes indicated on the label.   You may need to include additional fruits and vegetables each day.   In baking, for 1 cup white flour, you may use the following substitutions:   1 cup whole-wheat flour minus 2 tablespoons.   1/2 cup white flour plus 1/2 cup whole-wheat flour.   Low-Fat Diet BREADS, CEREALS, PASTA, RICE, DRIED PEAS, AND BEANS These products are high in carbohydrates and most are low in fat. Therefore, they can be increased in the diet as substitutes for fatty foods. They too, however, contain calories and should not be eaten in excess. Cereals can be eaten for snacks as well as for breakfast.  Include foods that contain fiber (fruits, vegetables, whole grains, and legumes). Research shows that fiber may lower blood cholesterol levels, especially the water-soluble fiber found in fruits, vegetables, oat products, and legumes. FRUITS AND VEGETABLES It is good to eat fruits and vegetables. Besides being sources of fiber, both are rich in vitamins and some minerals. They help you get the daily allowances of these nutrients. Fruits and vegetables can be used for snacks and desserts. MEATS Limit lean meat, chicken, [place], and fish to no more than 6 ounces per day. Beef, Pork, and Lamb Use lean cuts of beef, pork, and lamb. Lean cuts include:  Extra-lean ground beef.  Arm roast.  Sirloin tip.  Center-cut ham.  Round steak.  Loin chops.  Rump roast.  Tenderloin.  Trim all fat off the  outside of meats before cooking. It is not necessary to severely decrease the intake of red meat, but lean choices should be made. Lean meat is rich in protein and contains a highly absorbable form of iron. Premenopausal women, in particular, should avoid reducing lean red meat because this could increase the risk for low red blood cells (iron-deficiency anemia). The organ meats, such as liver, sweetbreads, kidneys, and brain are very rich in cholesterol. They should be limited. Chicken and [place] These are good sources of protein. The fat of poultry can be reduced by removing the skin and underlying fat layers before cooking. Chicken and [place] can be substituted for lean red meat in the diet. Poultry should not be fried  or covered with high-fat sauces. Fish and Shellfish Fish is a good source of protein. Shellfish contain cholesterol, but they usually are low in saturated fatty acids. The preparation of fish is important. Like chicken and [place], they should not be fried or covered with high-fat sauces. EGGS Egg whites contain no fat or cholesterol. They can be eaten often. Try 1 to 2 egg whites instead of whole eggs in recipes or use egg substitutes that do not contain yolk. MILK AND DAIRY PRODUCTS Use skim or 1% milk instead of 2% or whole milk. Decrease whole milk, natural, and processed cheeses. Use nonfat or low-fat (2%) cottage cheese or low-fat cheeses made from vegetable oils. Choose nonfat or low-fat (1 to 2%) yogurt. Experiment with evaporated skim milk in recipes that call for heavy cream. Substitute low-fat yogurt or low-fat cottage cheese for sour cream in dips and salad dressings. Have at least 2 servings of low-fat dairy products, such as 2 glasses of skim (or 1%) milk each day to help get your daily calcium intake.  FATS AND OILS Reduce the total intake of fats, especially saturated fat. Butterfat, lard, and beef fats are high in saturated fat and cholesterol. These should be avoided as  much as possible. Vegetable fats do not contain cholesterol, but certain vegetable fats, such as coconut oil, palm oil, and palm kernel oil are very high in saturated fats. These should be limited. These fats are often used in bakery goods, processed foods, popcorn, oils, and nondairy creamers. Vegetable shortenings and some peanut butters contain hydrogenated oils, which are also saturated fats. Read the labels on these foods and check for saturated vegetable oils. Unsaturated vegetable oils and fats do not raise blood cholesterol. However, they should be limited because they are fats and are high in calories. Total fat should still be limited to 30% of your daily caloric intake. Desirable liquid vegetable oils are corn oil, cottonseed oil, olive oil, canola oil, safflower oil, soybean oil, and sunflower oil. Peanut oil is not as good, but small amounts are acceptable. Buy a heart-healthy tub margarine that has no partially hydrogenated oils in the ingredients. Mayonnaise and salad dressings often are made from unsaturated fats, but they should also be limited because of their high calorie and fat content. Seeds, nuts, peanut butter, olives, and avocados are high in fat, but the fat is mainly the unsaturated type. These foods should be limited mainly to avoid excess calories and fat. OTHER EATING TIPS Snacks  Most sweets should be limited as snacks. They tend to be rich in calories and fats, and their caloric content outweighs their nutritional value. Some good choices in snacks are graham crackers, melba toast, soda crackers, bagels (no egg), English muffins, fruits, and vegetables. These snacks are preferable to snack crackers, [place] fries, and chips. Popcorn should be air-popped or cooked in small amounts of liquid vegetable oil. Desserts Eat fruit, low-fat yogurt, and fruit ices. AVOID pastries, cake, and cookies. Sherbet, angel food cake, gelatin dessert, frozen low-fat yogurt, or other frozen products  that do not contain saturated fat (pure fruit juice bars, frozen ice pops) are also acceptable.  COOKING METHODS Choose those methods that use little or no fat. They include: Poaching.  Braising.  Steaming.  Grilling.  Baking.  Stir-frying.  Broiling.  Microwaving.  Foods can be cooked in a nonstick pan without added fat, or use a nonfat cooking spray in regular cookware. Limit fried foods and avoid frying in saturated fat. Add moisture to lean  meats by using water, broth, cooking wines, and other nonfat or low-fat sauces along with the cooking methods mentioned above. Soups and stews should be chilled after cooking. The fat that forms on top after a few hours in the refrigerator should be skimmed off. When preparing meals, avoid using excess salt. Salt can contribute to raising blood pressure in some people. EATING AWAY FROM HOME Order entres, potatoes, and vegetables without sauces or butter. When meat exceeds the size of a deck of cards (3 to 4 ounces), the rest can be taken home for another meal. Choose vegetable or fruit salads and ask for low-calorie salad dressings to be served on the side. Use dressings sparingly. Limit high-fat toppings, such as bacon, crumbled eggs, cheese, sunflower seeds, and olives. Ask for heart-healthy tub margarine instead of butter.  Hemorrhoids Hemorrhoids are dilated (enlarged) veins around the rectum. Sometimes clots will form in the veins. This makes them swollen and painful. These are called thrombosed hemorrhoids. Causes of hemorrhoids include:  Constipation.   Straining to have a bowel movement.   HEAVY LIFTING HOME CARE INSTRUCTIONS  Eat a well balanced diet and drink 6 to 8 glasses of water every day to avoid constipation. You may also use a bulk laxative.   Avoid straining to have bowel movements.   Keep anal area dry and clean.   Do not use a donut shaped pillow or sit on the toilet for long periods. This increases blood pooling and  pain.   Move your bowels when your body has the urge; this will require less straining and will decrease pain and pressure.

## 2013-09-19 NOTE — H&P (View-Only) (Signed)
   Subjective:    Patient ID: Jeffrey [name], male    DOB: 1970-05-07, 43 y.o.   MRN: [id number]  Sallee Lange, MD  HPI Reflux never really right. Still has times can use bathroom: watery/solid. MAY RUN RIGHT THROUGH HIM. CAN GET TO HURTING IN HIS EPIGASTRIUM. NOTICES IN EVENING AND LAST FOR 1-2 HRS. PAIN IN EPIGASTRIUM RADIATING TO HIS BACK: 3-4 DAYS A WEEK. NO REAL TRIGGERS. STILL DRINKING ETOH. SMOKES SOME POT. MAY CAUSE PAIN IN MID ABD RADIATING TO BACK. FEELS NOT HIMSELF BUT NO VOMITING. BMs: 1-0-4-5. RARE FEELING BURNING CHEST PAIN AND FOOD BUBBLES BACK UP. MAY FEEL LIKE FACE IS RED AND HOT BUT NOT ASSOCIATED WITH PALPITATIONS OR DIZZINESS. WATERY STOOLS OFF AND ON BUT CONSTIPATION IS MORE COMMON. SX NO WORSE THAN WERE BACK IN 2011. PT DENIES FEVER, CHILLS, HEMATOCHEZIA,  vomiting, melena, SHORTNESS OF BREATH, CHANGE IN BOWEL IN HABITS, constipation, problems swallowing, OR problems with sedation.    Past Medical History  Diagnosis Date  . GERD (gastroesophageal reflux disease)    Past Surgical History  Procedure Laterality Date  . Cholecystectomy    . Esophagogastroduodenoscopy endoscopy    . Cardiac stress test     No Known Allergies  Current Outpatient Prescriptions  Medication Sig Dispense Refill  . albuterol (PROVENTIL HFA;VENTOLIN HFA) 108 (90 BASE) MCG/ACT inhaler Inhale 2 puffs into the lungs every 4 (four) hours as needed for wheezing or shortness of breath.    . esomeprazole (NEXIUM) 20 MG capsule Take 1 capsule (40 mg total) by mouth  daily before a meal.    . ibuprofen (ADVIL,MOTRIN) 200 MG tablet Take 400 mg by mouth every 8 (eight) hours as needed.    . vitamin C (ASCORBIC ACID) 500 MG tablet Take 500 mg by mouth daily.    . meclizine (ANTIVERT) 25 MG tablet Take 1 or 2 po Q 6hrs for dizziness    .      Marland Kitchen       Family History  Problem Relation Age of Onset  . Colon cancer Neg Hx   . Colon polyps Neg Hx     History  Substance Use Topics  . Smoking status:  Former Research scientist (life sciences)  . Smokeless tobacco: Current User  . Alcohol Use: 7.2 oz/week    12 Cans of beer per week        Review of Systems PER HPI OTHERWISE ALL SYSTEMS ARE NEGATIVE.     Objective:   Physical Exam  Vitals reviewed. Constitutional: He is oriented to person, place, and time. He appears well-developed and well-nourished. No distress.  HENT:  Head: Normocephalic and atraumatic.  Mouth/Throat: Oropharynx is clear and moist. No oropharyngeal exudate.  Eyes: Pupils are equal, round, and reactive to light. No scleral icterus.  Neck: Normal range of motion. Neck supple.  Cardiovascular: Normal rate, regular rhythm and normal heart sounds.   Pulmonary/Chest: Effort normal and breath sounds normal. No respiratory distress.  Abdominal: Soft. Bowel sounds are normal. He exhibits no distension. There is tenderness. There is no rebound and no guarding.  MILD TTP IN THE EPIGASTRIUM    Musculoskeletal: He exhibits no edema.  Lymphadenopathy:    He has no cervical adenopathy.  Neurological: He is alert and oriented to person, place, and time.  NO FOCAL DEFICITS   Psychiatric: He has a normal mood and affect.          Assessment & Plan:

## 2013-09-19 NOTE — Op Note (Addendum)
[facility] [address] [place], [address]   ENDOSCOPY PROCEDURE REPORT  PATIENT: [name], Jeffrey  MR#: [id number] BIRTHDATE: Aug 02, 1970 , 42  yrs. old GENDER: Male  ENDOSCOPIST: Barney Drain, MD REFFERED BT:DHRCB Wolfgang Phoenix, M.D.  PROCEDURE DATE:  09/19/2013 PROCEDURE:   EGD with biopsy, EGD with dilatation over guidewire, and EGD with balloon dilatation  INDICATIONS:1.  dyspepsia.   2.  epigastric pain.   3.  weight loss. LAST EGD/bX 2010: NL ESOPHAGUS/GASTRITIS. USES IBUPROFEN. HASN'T STARTED NEXIUM. MEDICATIONS: TCS+ Versed 1mg  IV TOPICAL ANESTHETIC: Viscous Xylocaine  DESCRIPTION OF PROCEDURE:   After the risks benefits and alternatives of the procedure were thoroughly explained, informed consent was obtained.  The EG-[id number] ([id number])  endoscope was introduced through the mouth and advanced to the second portion of the duodenum. The instrument was slowly withdrawn as the mucosa was carefully examined.  Prior to withdrawal of the scope, the guidwire was placed.  The esophagus was dilated successfully.  The patient was recovered in endoscopy and discharged home in satisfactory condition.   ESOPHAGUS: The mucosa of the esophagus appeared normal. EMPIRIC DILATION PERFORMED DUE TO POSSIBILITY OF PROXIMAL CERVICAL WEB. STOMACH: Mild erosive gastritis (inflammation) was found in the gastric antrum.  Multiple biopsies were performed using cold forceps.   Small stenosis traversable after dilation was found at the pylorus.  The finding appeared to be benign and intrinsic. DUODENUM: The duodenal mucosa showed no abnormalities in the bulb and second portion of the duodenum.   Dilation was then performed at the gastroesphageal junction  AND PYLORUS. Dilator: Savary over guidewire Size(s): 15-17 MM Resistance: minimal Heme: none PYLORIC DILATION: Balloon Size(s): 12-15 MM Resistance: moderate Heme: yes COMPLICATIONS: There were no complications.  ENDOSCOPIC  IMPRESSION: 1.   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   MILD Erosive gastritis  RECOMMENDATIONS: FOLLOW RECOMMENDATIONS FOR CONTROLLING REFLUX. CONTINUE NEXIUM.  TAKE 30 MINUTES BEFORE MEALS BID. AVOID ITEMS THAT TRIGGER GASTRITIS. FOLLOW A HIGH FIBER/LOW FAT DIET.  AVOID ITEMS THAT CAUSE BLOATING.  BIOPSY RESULTS WILL BE BACK IN 7 DAYS. FOLLOW UP IN OCT 2015.  Next colonoscopy in 5 years.  ALL FIRST DEGREE RELATIVES NEED A COLONOSCOPY AT AGE 35 AND THEN EVERY 5 YEARS.   _______________________________ Lorrin MaisBarney Drain, MD 09/19/2013 3:46 PM

## 2013-09-22 ENCOUNTER — Encounter (HOSPITAL_COMMUNITY): Payer: Self-pay | Admitting: Gastroenterology

## 2013-09-30 ENCOUNTER — Telehealth: Payer: Self-pay | Admitting: Gastroenterology

## 2013-09-30 NOTE — Telephone Encounter (Signed)
Please call pt. His stomach Bx shows gastritis.   FOLLOW RECOMMENDATIONS FOR CONTROLLING REFLUX.   CONTINUE NEXIUM. TAKE 30 MINUTES BEFORE MEALS.  AVOID ITEMS THAT TRIGGER GASTRITIS.   FOLLOW A HIGH FIBER/LOW FAT DIET. AVOID ITEMS THAT CAUSE BLOATING.   FOLLOW UP IN OCT 2015 E30 DYSPHAGIA/DYSPEPSIA.  Next colonoscopy in 5 years. ALL FIRST DEGREE RELATIVES NEED A COLONOSCOPY AT AGE 41 AND THEN EVERY 5 YEARS.

## 2013-10-02 NOTE — Telephone Encounter (Signed)
Called and informed pt.  

## 2013-10-02 NOTE — Telephone Encounter (Signed)
Reminder in EPIC 

## 2013-11-16 ENCOUNTER — Encounter: Payer: Self-pay | Admitting: Gastroenterology

## 2014-01-18 ENCOUNTER — Encounter: Payer: Self-pay | Admitting: Family Medicine

## 2014-01-18 ENCOUNTER — Ambulatory Visit (INDEPENDENT_AMBULATORY_CARE_PROVIDER_SITE_OTHER): Payer: BC Managed Care – PPO | Admitting: Family Medicine

## 2014-01-18 VITALS — BP 122/84 | Temp 98.5°F | Ht 72.0 in | Wt 239.0 lb

## 2014-01-18 DIAGNOSIS — R200 Anesthesia of skin: Secondary | ICD-10-CM

## 2014-01-18 DIAGNOSIS — M5412 Radiculopathy, cervical region: Secondary | ICD-10-CM

## 2014-01-18 DIAGNOSIS — R208 Other disturbances of skin sensation: Secondary | ICD-10-CM

## 2014-01-18 DIAGNOSIS — R5383 Other fatigue: Secondary | ICD-10-CM

## 2014-01-18 NOTE — Patient Instructions (Signed)
DASH Eating Plan °DASH stands for "Dietary Approaches to Stop Hypertension." The DASH eating plan is a healthy eating plan that has been shown to reduce high blood pressure (hypertension). Additional health benefits may include reducing the risk of type 2 diabetes mellitus, heart disease, and stroke. The DASH eating plan may also help with weight loss. °WHAT DO I NEED TO KNOW ABOUT THE DASH EATING PLAN? °For the DASH eating plan, you will follow these general guidelines: °· Choose foods with a percent daily value for sodium of less than 5% (as listed on the food label). °· Use salt-free seasonings or herbs instead of table salt or sea salt. °· Check with your health care provider or pharmacist before using salt substitutes. °· Eat lower-sodium products, often labeled as "lower sodium" or "no salt added." °· Eat fresh foods. °· Eat more vegetables, fruits, and low-fat dairy products. °· Choose whole grains. Look for the word "whole" as the first word in the ingredient list. °· Choose fish and skinless chicken or turkey more often than red meat. Limit fish, poultry, and meat to 6 oz (170 g) each day. °· Limit sweets, desserts, sugars, and sugary drinks. °· Choose heart-healthy fats. °· Limit cheese to 1 oz (28 g) per day. °· Eat more home-cooked food and less restaurant, buffet, and fast food. °· Limit fried foods. °· Cook foods using methods other than frying. °· Limit canned vegetables. If you do use them, rinse them well to decrease the sodium. °· When eating at a restaurant, ask that your food be prepared with less salt, or no salt if possible. °WHAT FOODS CAN I EAT? °Seek help from a dietitian for individual calorie needs. °Grains °Whole grain or whole wheat bread. Brown rice. Whole grain or whole wheat pasta. Quinoa, bulgur, and whole grain cereals. Low-sodium cereals. Corn or whole wheat flour tortillas. Whole grain cornbread. Whole grain crackers. Low-sodium crackers. °Vegetables °Fresh or frozen vegetables  (raw, steamed, roasted, or grilled). Low-sodium or reduced-sodium tomato and vegetable juices. Low-sodium or reduced-sodium tomato sauce and paste. Low-sodium or reduced-sodium canned vegetables.  °Fruits °All fresh, canned (in natural juice), or frozen fruits. °Meat and Other Protein Products °Ground beef (85% or leaner), grass-fed beef, or beef trimmed of fat. Skinless chicken or turkey. Ground chicken or turkey. Pork trimmed of fat. All fish and seafood. Eggs. Dried beans, peas, or lentils. Unsalted nuts and seeds. Unsalted canned beans. °Dairy °Low-fat dairy products, such as skim or 1% milk, 2% or reduced-fat cheeses, low-fat ricotta or cottage cheese, or plain low-fat yogurt. Low-sodium or reduced-sodium cheeses. °Fats and Oils °Tub margarines without trans fats. Light or reduced-fat mayonnaise and salad dressings (reduced sodium). Avocado. Safflower, olive, or canola oils. Natural peanut or almond butter. °Other °Unsalted popcorn and pretzels. °The items listed above may not be a complete list of recommended foods or beverages. Contact your dietitian for more options. °WHAT FOODS ARE NOT RECOMMENDED? °Grains °White bread. White pasta. White rice. Refined cornbread. Bagels and croissants. Crackers that contain trans fat. °Vegetables °Creamed or fried vegetables. Vegetables in a cheese sauce. Regular canned vegetables. Regular canned tomato sauce and paste. Regular tomato and vegetable juices. °Fruits °Dried fruits. Canned fruit in light or heavy syrup. Fruit juice. °Meat and Other Protein Products °Fatty cuts of meat. Ribs, chicken wings, bacon, sausage, bologna, salami, chitterlings, fatback, hot dogs, bratwurst, and packaged luncheon meats. Salted nuts and seeds. Canned beans with salt. °Dairy °Whole or 2% milk, cream, half-and-half, and cream cheese. Whole-fat or sweetened yogurt. Full-fat   cheeses or blue cheese. Nondairy creamers and whipped toppings. Processed cheese, cheese spreads, or cheese  curds. °Condiments °Onion and garlic salt, seasoned salt, table salt, and sea salt. Canned and packaged gravies. Worcestershire sauce. Tartar sauce. Barbecue sauce. Teriyaki sauce. Soy sauce, including reduced sodium. Steak sauce. Fish sauce. Oyster sauce. Cocktail sauce. Horseradish. Ketchup and mustard. Meat flavorings and tenderizers. Bouillon cubes. Hot sauce. Tabasco sauce. Marinades. Taco seasonings. Relishes. °Fats and Oils °Butter, stick margarine, lard, shortening, ghee, and bacon fat. Coconut, palm kernel, or palm oils. Regular salad dressings. °Other °Pickles and olives. Salted popcorn and pretzels. °The items listed above may not be a complete list of foods and beverages to avoid. Contact your dietitian for more information. °WHERE CAN I FIND MORE INFORMATION? °National Heart, Lung, and [employer] Institute: www.nhlbi.nih.gov/health/health-topics/topics/dash/ °Document Released: 02/12/2011 Document Revised: 07/10/2013 Document Reviewed: 12/28/2012 °ExitCare® Patient Information ©2015 [employer]. This information is not intended to replace advice given to you by your health care provider. Make sure you discuss any questions you have with your health care provider. ° °

## 2014-01-18 NOTE — Progress Notes (Signed)
   Subjective:    Patient ID: Jeffrey [name], male    DOB: [birth year], 43 y.o.   MRN: [id number]  Back Pain This is a new problem. The current episode started more than 1 month ago. The problem occurs intermittently. The problem has been gradually worsening since onset. The pain is present in the thoracic spine. The quality of the pain is described as aching. The pain radiates to the left thigh. The pain is moderate. The pain is the same all the time. Stiffness is present all day. (Neck pain, leg numbness) He has tried NSAIDs for the symptoms. The treatment provided no relief.   Patient states that he has no other concerns at this time.    neck pain- present for about 1 year. Increased pain with rotation worse on the left side.left arm will go numb down to the hand, has sharp pain. Numbness in the arm 15 to 20 minutes. Happens several times per week.this is been progressively worse over the past year. Now happening so frequently that it's a major disruption to his rest at nighttime as well as his work.  Back and leg on the left side, tingles into the leg ,been on for 1 year, progressive over the past year, feels weak at times Nothing triggers it. Sometimes wakes him up.  Exercise helps to some degree.  Back and side/chest- more often symptoms on the left side  Patient gets concerned  About other causes  Review of Systems  Musculoskeletal: Positive for back pain.       Objective:   Physical Exam  Constitutional: He appears well-nourished. No distress.  Cardiovascular: Normal rate, regular rhythm and normal heart sounds.   No murmur heard. Pulmonary/Chest: Effort normal and breath sounds normal. No respiratory distress.  Musculoskeletal: He exhibits no edema.  Lymphadenopathy:    He has no cervical adenopathy.  Neurological: He is alert.  Psychiatric: His behavior is normal.  Vitals reviewed.  No muscle strength loss at this point reflexes good Negative straight leg raise on  the left      Assessment & Plan:  Patient concerned about face becoming red then going away in 20 minutes -I cannot place any type of specific issue as the cause of this. I do not recommend further testing for this  Cervical pain along with neuralgia into the left arm this is been progressive over the past several months been going on for over a year but is getting worse. Because of increasing numbness and increasing pain into the trapezius tricep and down the arm, I believe this patient would benefit from having MRI of his cervical spine  I do not feel this patient needs an MRI of his lumbar spine. No new medications at this point await what the MRI shows  225 minutes spent with patient on this issue

## 2014-01-23 ENCOUNTER — Ambulatory Visit (HOSPITAL_COMMUNITY)
Admission: RE | Admit: 2014-01-23 | Discharge: 2014-01-23 | Disposition: A | Discharge status: Home / Self Care | Payer: BC Managed Care – PPO | Source: Ambulatory Visit | Attending: Family Medicine | Admitting: Family Medicine

## 2014-01-23 DIAGNOSIS — M479 Spondylosis, unspecified: Secondary | ICD-10-CM | POA: Diagnosis not present

## 2014-01-23 DIAGNOSIS — M25512 Pain in left shoulder: Secondary | ICD-10-CM | POA: Insufficient documentation

## 2014-01-23 DIAGNOSIS — M542 Cervicalgia: Secondary | ICD-10-CM | POA: Insufficient documentation

## 2014-01-25 NOTE — Addendum Note (Signed)
Addended by: Dairl Ponder on: 01/25/2014 11:24 AM   Modules accepted: Orders

## 2014-03-26 ENCOUNTER — Emergency Department (HOSPITAL_COMMUNITY): Payer: BLUE CROSS/BLUE SHIELD

## 2014-03-26 ENCOUNTER — Encounter (HOSPITAL_COMMUNITY): Payer: Self-pay | Admitting: Emergency Medicine

## 2014-03-26 ENCOUNTER — Emergency Department (HOSPITAL_COMMUNITY)
Admission: EM | Admit: 2014-03-26 | Discharge: 2014-03-26 | Disposition: A | Discharge status: Home / Self Care | Payer: BLUE CROSS/BLUE SHIELD | Attending: Emergency Medicine | Admitting: Emergency Medicine

## 2014-03-26 DIAGNOSIS — Z7982 Long term (current) use of aspirin: Secondary | ICD-10-CM | POA: Diagnosis not present

## 2014-03-26 DIAGNOSIS — S4991XA Unspecified injury of right shoulder and upper arm, initial encounter: Secondary | ICD-10-CM | POA: Diagnosis not present

## 2014-03-26 DIAGNOSIS — Z79899 Other long term (current) drug therapy: Secondary | ICD-10-CM | POA: Diagnosis not present

## 2014-03-26 DIAGNOSIS — K219 Gastro-esophageal reflux disease without esophagitis: Secondary | ICD-10-CM | POA: Diagnosis not present

## 2014-03-26 DIAGNOSIS — S0990XA Unspecified injury of head, initial encounter: Secondary | ICD-10-CM | POA: Diagnosis present

## 2014-03-26 DIAGNOSIS — Z87891 Personal history of nicotine dependence: Secondary | ICD-10-CM | POA: Insufficient documentation

## 2014-03-26 DIAGNOSIS — Y998 Other external cause status: Secondary | ICD-10-CM | POA: Insufficient documentation

## 2014-03-26 DIAGNOSIS — S060X0A Concussion without loss of consciousness, initial encounter: Secondary | ICD-10-CM | POA: Diagnosis not present

## 2014-03-26 DIAGNOSIS — Y9389 Activity, other specified: Secondary | ICD-10-CM | POA: Diagnosis not present

## 2014-03-26 DIAGNOSIS — Y9289 Other specified places as the place of occurrence of the external cause: Secondary | ICD-10-CM | POA: Insufficient documentation

## 2014-03-26 NOTE — Discharge Instructions (Signed)
Concussion  A concussion, or closed-head injury, is a brain injury caused by a direct blow to the head or by a quick and sudden movement (jolt) of the head or neck. Concussions are usually not life-threatening. Even so, the effects of a concussion can be serious. If you have had a concussion before, you are more likely to experience concussion-like symptoms after a direct blow to the head.   CAUSES  · Direct blow to the head, such as from running into another player during a soccer game, being hit in a fight, or hitting your head on a hard surface.  · A jolt of the head or neck that causes the brain to move back and forth inside the skull, such as in a car crash.  SIGNS AND SYMPTOMS  The signs of a concussion can be hard to notice. Early on, they may be missed by you, family members, and health care providers. You may look fine but act or feel differently.  Symptoms are usually temporary, but they may last for days, weeks, or even longer. Some symptoms may appear right away while others may not show up for hours or days. Every head injury is different. Symptoms include:  · Mild to moderate headaches that will not go away.  · A feeling of pressure inside your head.  · Having more trouble than usual:  ¨ Learning or remembering things you have heard.  ¨ Answering questions.  ¨ Paying attention or concentrating.  ¨ Organizing daily tasks.  ¨ Making decisions and solving problems.  · Slowness in thinking, acting or reacting, speaking, or reading.  · Getting lost or being easily confused.  · Feeling tired all the time or lacking energy (fatigued).  · Feeling drowsy.  · Sleep disturbances.  ¨ Sleeping more than usual.  ¨ Sleeping less than usual.  ¨ Trouble falling asleep.  ¨ Trouble sleeping (insomnia).  · Loss of balance or feeling lightheaded or dizzy.  · Nausea or vomiting.  · Numbness or tingling.  · Increased sensitivity to:  ¨ Sounds.  ¨ Lights.  ¨ Distractions.  · Vision problems or eyes that tire  easily.  · Diminished sense of taste or smell.  · Ringing in the ears.  · Mood changes such as feeling sad or anxious.  · Becoming easily irritated or angry for little or no reason.  · Lack of motivation.  · Seeing or hearing things other people do not see or hear (hallucinations).  DIAGNOSIS  Your health care provider can usually diagnose a concussion based on a description of your injury and symptoms. He or she will ask whether you passed out (lost consciousness) and whether you are having trouble remembering events that happened right before and during your injury.  Your evaluation might include:  · A brain scan to look for signs of injury to the brain. Even if the test shows no injury, you may still have a concussion.  · Blood tests to be sure other problems are not present.  TREATMENT  · Concussions are usually treated in an emergency department, in urgent care, or at a clinic. You may need to stay in the hospital overnight for further treatment.  · Tell your health care provider if you are taking any medicines, including prescription medicines, over-the-counter medicines, and natural remedies. Some medicines, such as blood thinners (anticoagulants) and aspirin, may increase the chance of complications. Also tell your health care provider whether you have had alcohol or are taking illegal drugs. This information   may affect treatment.  · Your health care provider will send you home with important instructions to follow.  · How fast you will recover from a concussion depends on many factors. These factors include how severe your concussion is, what part of your brain was injured, your age, and how healthy you were before the concussion.  · Most people with mild injuries recover fully. Recovery can take time. In general, recovery is slower in older persons. Also, persons who have had a concussion in the past or have other medical problems may find that it takes longer to recover from their current injury.  HOME  CARE INSTRUCTIONS  General Instructions  · Carefully follow the directions your health care provider gave you.  · Only take over-the-counter or prescription medicines for pain, discomfort, or fever as directed by your health care provider.  · Take only those medicines that your health care provider has approved.  · Do not drink alcohol until your health care provider says you are well enough to do so. Alcohol and certain other drugs may slow your recovery and can put you at risk of further injury.  · If it is harder than usual to remember things, write them down.  · If you are easily distracted, try to do one thing at a time. For example, do not try to watch TV while fixing dinner.  · Talk with family members or close friends when making important decisions.  · Keep all follow-up appointments. Repeated evaluation of your symptoms is recommended for your recovery.  · Watch your symptoms and tell others to do the same. Complications sometimes occur after a concussion. Older adults with a brain injury may have a higher risk of serious complications, such as a blood clot on the brain.  · Tell your teachers, school nurse, school counselor, coach, athletic trainer, or work manager about your injury, symptoms, and restrictions. Tell them about what you can or cannot do. They should watch for:  ¨ Increased problems with attention or concentration.  ¨ Increased difficulty remembering or learning new information.  ¨ Increased time needed to complete tasks or assignments.  ¨ Increased irritability or decreased ability to cope with stress.  ¨ Increased symptoms.  · Rest. Rest helps the brain to heal. Make sure you:  ¨ Get plenty of sleep at night. Avoid staying up late at night.  ¨ Keep the same bedtime hours on weekends and weekdays.  ¨ Rest during the day. Take daytime naps or rest breaks when you feel tired.  · Limit activities that require a lot of thought or concentration. These include:  ¨ Doing homework or job-related  work.  ¨ Watching TV.  ¨ Working on the computer.  · Avoid any situation where there is potential for another head injury (football, hockey, soccer, basketball, martial arts, downhill snow sports and horseback riding). Your condition will get worse every time you experience a concussion. You should avoid these activities until you are evaluated by the appropriate follow-up health care providers.  Returning To Your Regular Activities  You will need to return to your normal activities slowly, not all at once. You must give your body and brain enough time for recovery.  · Do not return to sports or other athletic activities until your health care provider tells you it is safe to do so.  · Ask your health care provider when you can drive, ride a bicycle, or operate heavy machinery. Your ability to react may be slower after a   brain injury. Never do these activities if you are dizzy.  · Ask your health care provider about when you can return to work or school.  Preventing Another Concussion  It is very important to avoid another brain injury, especially before you have recovered. In rare cases, another injury can lead to permanent brain damage, brain swelling, or death. The risk of this is greatest during the first 7-10 days after a head injury. Avoid injuries by:  · Wearing a seat belt when riding in a car.  · Drinking alcohol only in moderation.  · Wearing a helmet when biking, skiing, skateboarding, skating, or doing similar activities.  · Avoiding activities that could lead to a second concussion, such as contact or recreational sports, until your health care provider says it is okay.  · Taking safety measures in your home.  ¨ Remove clutter and tripping hazards from floors and stairways.  ¨ Use grab bars in bathrooms and handrails by stairs.  ¨ Place non-slip mats on floors and in bathtubs.  ¨ Improve lighting in dim areas.  SEEK MEDICAL CARE IF:  · You have increased problems paying attention or  concentrating.  · You have increased difficulty remembering or learning new information.  · You need more time to complete tasks or assignments than before.  · You have increased irritability or decreased ability to cope with stress.  · You have more symptoms than before.  Seek medical care if you have any of the following symptoms for more than 2 weeks after your injury:  · Lasting (chronic) headaches.  · Dizziness or balance problems.  · Nausea.  · Vision problems.  · Increased sensitivity to noise or light.  · Depression or mood swings.  · Anxiety or irritability.  · Memory problems.  · Difficulty concentrating or paying attention.  · Sleep problems.  · Feeling tired all the time.  SEEK IMMEDIATE MEDICAL CARE IF:  · You have severe or worsening headaches. These may be a sign of a blood clot in the brain.  · You have weakness (even if only in one hand, leg, or part of the face).  · You have numbness.  · You have decreased coordination.  · You vomit repeatedly.  · You have increased sleepiness.  · One pupil is larger than the other.  · You have convulsions.  · You have slurred speech.  · You have increased confusion. This may be a sign of a blood clot in the brain.  · You have increased restlessness, agitation, or irritability.  · You are unable to recognize people or places.  · You have neck pain.  · It is difficult to wake you up.  · You have unusual behavior changes.  · You lose consciousness.  MAKE SURE YOU:  · Understand these instructions.  · Will watch your condition.  · Will get help right away if you are not doing well or get worse.  Document Released: 05/16/2003 Document Revised: 02/28/2013 Document Reviewed: 09/15/2012  ExitCare® Patient Information ©2015 [employer]. This information is not intended to replace advice given to you by your health care provider. Make sure you discuss any questions you have with your health care provider.

## 2014-03-26 NOTE — ED Notes (Signed)
MD at bedside. 
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
## 2014-03-26 NOTE — ED Notes (Signed)
States neck is sore and right arm
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
## 2014-05-15 ENCOUNTER — Ambulatory Visit (INDEPENDENT_AMBULATORY_CARE_PROVIDER_SITE_OTHER): Payer: Self-pay | Admitting: Urology

## 2014-05-15 DIAGNOSIS — E291 Testicular hypofunction: Secondary | ICD-10-CM

## 2014-05-15 DIAGNOSIS — N5201 Erectile dysfunction due to arterial insufficiency: Secondary | ICD-10-CM

## 2014-05-30 ENCOUNTER — Encounter (HOSPITAL_COMMUNITY): Payer: Self-pay | Admitting: Emergency Medicine

## 2014-05-30 ENCOUNTER — Emergency Department (HOSPITAL_COMMUNITY): Payer: BLUE CROSS/BLUE SHIELD

## 2014-05-30 ENCOUNTER — Emergency Department (HOSPITAL_COMMUNITY)
Admission: EM | Admit: 2014-05-30 | Discharge: 2014-05-30 | Disposition: A | Discharge status: Home / Self Care | Payer: BLUE CROSS/BLUE SHIELD | Attending: Emergency Medicine | Admitting: Emergency Medicine

## 2014-05-30 DIAGNOSIS — R1013 Epigastric pain: Secondary | ICD-10-CM | POA: Diagnosis not present

## 2014-05-30 DIAGNOSIS — Z79899 Other long term (current) drug therapy: Secondary | ICD-10-CM | POA: Insufficient documentation

## 2014-05-30 DIAGNOSIS — M549 Dorsalgia, unspecified: Secondary | ICD-10-CM | POA: Insufficient documentation

## 2014-05-30 DIAGNOSIS — Z7982 Long term (current) use of aspirin: Secondary | ICD-10-CM | POA: Diagnosis not present

## 2014-05-30 DIAGNOSIS — R1012 Left upper quadrant pain: Secondary | ICD-10-CM | POA: Diagnosis not present

## 2014-05-30 DIAGNOSIS — Z87891 Personal history of nicotine dependence: Secondary | ICD-10-CM | POA: Insufficient documentation

## 2014-05-30 DIAGNOSIS — K219 Gastro-esophageal reflux disease without esophagitis: Secondary | ICD-10-CM | POA: Diagnosis not present

## 2014-05-30 DIAGNOSIS — R079 Chest pain, unspecified: Secondary | ICD-10-CM | POA: Diagnosis present

## 2014-05-30 LAB — I-STAT TROPONIN, ED
Troponin i, poc: 0 ng/mL (ref 0.00–0.08)
Troponin i, poc: 0 ng/mL (ref 0.00–0.08)

## 2014-05-30 LAB — BASIC METABOLIC PANEL
Anion gap: 5 (ref 5–15)
BUN: 10 mg/dL (ref 6–23)
CO2: 32 mmol/L (ref 19–32)
Calcium: 9.3 mg/dL (ref 8.4–10.5)
Chloride: 106 mmol/L (ref 96–112)
Creatinine, Ser: 1.01 mg/dL (ref 0.50–1.35)
GFR calc Af Amer: >90 mL/min (ref >90)
GFR calc non Af Amer: >90 mL/min (ref >90)
Glucose, Bld: 78 mg/dL (ref 70–99)
Potassium: 3.8 mmol/L (ref 3.5–5.1)
Sodium: 143 mmol/L (ref 135–145)

## 2014-05-30 LAB — CBC
HCT: 40.2 % (ref 39.0–52.0)
Hemoglobin: 14 g/dL (ref 13.0–17.0)
MCH: 31.7 pg (ref 26.0–34.0)
MCHC: 34.8 g/dL (ref 30.0–36.0)
MCV: 91 fL (ref 78.0–100.0)
Platelets: 159 10*3/uL (ref 150–400)
RBC: 4.42 MIL/uL (ref 4.22–5.81)
RDW: 12.6 % (ref 11.5–15.5)
WBC: 4.9 10*3/uL (ref 4.0–10.5)

## 2014-05-30 MED ORDER — SUCRALFATE 1 G PO TABS: 1.0000 g | ORAL_TABLET | Freq: Three times a day (TID) | ORAL | Status: DC

## 2014-05-30 NOTE — ED Provider Notes (Signed)
CSN: [id number]     Arrival date & time 05/30/14  1459 History   First MD Initiated Contact with Patient 05/30/14 1655     Chief Complaint  Patient presents with  . Chest Pain     (Consider location/radiation/quality/duration/timing/severity/associated sxs/prior Treatment) HPI Jeffrey [name] is a 44 year old male with past medical history of GERD who presents the ER complaining of chest discomfort. Patient states his symptoms have been present for the past 2 years, however experienced worsening of his symptoms in the past several days. Patient reports he has been experiencing chest pain radiating from his epigastrium up into his chest as well as ongoing back pain for the past. Patient states he has been experiencing these similar symptoms over the past several years. Patient states his symptoms are intermittent, and typically mild. Patient states over the past 2 days his symptoms have been more severe than usual. Patient states he intermittently has days where his symptoms are more severe, then the symptoms subside. Patient states he has been evaluated in the ER for identical signs and symptoms in the past, and has been told he has muscular skeletal back pain as well as GERD to describe his chest discomfort. Patient states his symptoms are more prevalent at rest, and he states that with exercise his symptoms actually subside. Patient states he uses an elliptical on a regular basis and has never had any symptoms while on the elliptical. He states he actually went to the gym one day when he is experiencing chest and back pain, began to exercise and this made him feel better. Patient states that he feels that signs and symptoms today are consistent with ongoing issues he has been having for the past several years, however he on occasion becomes concerned when experiencing the chest and back pain at the same time. Patient states his back pain is worse with range of motion, alleviated with certain positions.  Patient states he has associated tingling sensation that radiates into his leg which is also at baseline for him. Patient denies any associated palpitations, shortness of breath, dizziness, headache, weakness, blurred vision, nausea, vomiting.   Past Medical History  Diagnosis Date  . GERD (gastroesophageal reflux disease)    Past Surgical History  Procedure Laterality Date  . Cholecystectomy    . Esophagogastroduodenoscopy endoscopy    . Cardiac stress test    . Colonoscopy N/A 09/19/2013    Procedure: COLONOSCOPY;  Surgeon: [name], MD;  Location: [facility];  Service: Endoscopy;  Laterality: N/A;  10:00 - moved to 10:45 [name] to notify pt  . Esophagogastroduodenoscopy N/A 09/19/2013    Procedure: ESOPHAGOGASTRODUODENOSCOPY (EGD);  Surgeon: [name], MD;  Location: [facility];  Service: Endoscopy;  Laterality: N/A;  . Savory dilation N/A 09/19/2013    Procedure: SAVORY DILATION;  Surgeon: [name], MD;  Location: [facility];  Service: Endoscopy;  Laterality: N/A;  Venia Minks dilation N/A 09/19/2013    Procedure: Venia Minks DILATION;  Surgeon: [name], MD;  Location: [facility];  Service: Endoscopy;  Laterality: N/A;   Family History  Problem Relation Age of Onset  . Colon cancer Neg Hx   . Colon polyps Neg Hx    History  Substance Use Topics  . Smoking status: Former Research scientist (life sciences)  . Smokeless tobacco: Current User  . Alcohol Use: 7.2 oz/week    12 Cans of beer per week    Review of Systems  Constitutional: Negative for fever.  HENT: Negative  for trouble swallowing.   Eyes: Negative for visual disturbance.  Respiratory: Negative for shortness of breath.   Cardiovascular: Positive for chest pain.  Gastrointestinal: Negative for nausea, vomiting and abdominal pain.  Genitourinary: Negative for dysuria.  Musculoskeletal: Positive for back pain. Negative for neck pain.  Skin: Negative for rash.  Neurological: Negative for dizziness, weakness and  numbness.  Psychiatric/Behavioral: Negative.       Allergies  Review of patient's allergies indicates no known allergies.  Home Medications   Prior to Admission medications   Medication Sig Start Date End Date Taking? Authorizing Provider  acetaminophen (TYLENOL) 500 MG tablet Take 500 mg by mouth every 6 (six) hours as needed for mild pain, moderate pain or headache.   Yes Historical Provider, MD  albuterol (PROVENTIL HFA;VENTOLIN HFA) 108 (90 BASE) MCG/ACT inhaler Inhale 2 puffs into the lungs every 4 (four) hours as needed for wheezing or shortness of breath. 04/23/12  Yes Noemi Chapel, MD  aspirin EC 81 MG tablet Take 81 mg by mouth once a week.   Yes Historical Provider, MD  esomeprazole (NEXIUM) 40 MG capsule Take 1 capsule (40 mg total) by mouth 2 (two) times daily before a meal. 09/19/13  Yes [name], MD  ibuprofen (ADVIL,MOTRIN) 200 MG tablet Take 400 mg by mouth every 8 (eight) hours as needed for moderate pain.    Yes Historical Provider, MD  tadalafil (CIALIS) 5 MG tablet Take 5 mg by mouth daily.   Yes Historical Provider, MD  vitamin C (ASCORBIC ACID) 500 MG tablet Take 500 mg by mouth daily.   Yes Historical Provider, MD  sucralfate (CARAFATE) 1 G tablet Take 1 tablet (1 g total) by mouth 4 (four) times daily -  with meals and at bedtime. 05/30/14   Dahlia Bailiff, PA-C   BP 122/94 mmHg  Pulse 83  Temp(Src) 98.2 F (36.8 C) (Oral)  Resp 18  SpO2 100% Physical Exam  Constitutional: He is oriented to person, place, and time. He appears well-developed and well-nourished. No distress.  HENT:  Head: Normocephalic and atraumatic.  Mouth/Throat: Oropharynx is clear and moist. No oropharyngeal exudate.  Eyes: Right eye exhibits no discharge. Left eye exhibits no discharge. No scleral icterus.  Neck: Normal range of motion.  Cardiovascular: Normal rate, regular rhythm and normal heart sounds.   No murmur heard. Pulmonary/Chest: Effort normal and breath sounds normal. No  respiratory distress.  Abdominal: Soft. Normal appearance and bowel sounds are normal. There is tenderness in the epigastric area and left upper quadrant. There is no rigidity, no guarding, no tenderness at McBurney's point and negative Murphy's sign.  Mild epigastric and left upper quadrant tenderness  Musculoskeletal: Normal range of motion. He exhibits no edema or tenderness.       Back:  Neurological: He is alert and oriented to person, place, and time. He has normal strength. No cranial nerve deficit or sensory deficit. He displays a negative Romberg sign. Coordination and gait normal. GCS eye subscore is 4. GCS verbal subscore is 5. GCS motor subscore is 6.  Patient fully alert, answering questions appropriately in full, clear sentences cranial nerves II through XII grossly intact. Motor strength 5 out of 5 in all major muscle groups of upper and lower extremities. Distal sensation intact.  Skin: Skin is warm and dry. No rash noted. He is not diaphoretic.  Psychiatric: He has a normal mood and affect.  Nursing note and vitals reviewed.   ED Course  Procedures (including critical care time)  Labs Review Labs Reviewed  CBC  BASIC METABOLIC PANEL  I-STAT [place], ED  I-STAT [place], ED    Imaging Review Dg Chest 2 View  05/30/2014   CLINICAL DATA:  Increase chest pain since yesterday radiating to back, LEFT arm and leg numbness, palpitations  EXAM: CHEST  2 VIEW  COMPARISON:  04/23/2012  FINDINGS: Normal heart size, mediastinal contours and pulmonary vascularity.  Decreased bronchitic changes versus previous exam.  Lungs clear.  No pleural effusion or pneumothorax.  Bones unremarkable.  IMPRESSION: No acute abnormalities.  Improved bronchitic changes versus prior study.   Electronically Signed   By: Lavonia Dana M.D.   On: 05/30/2014 15:52     EKG Interpretation   Date/Time:  Wednesday May 30 2014 15:03:38 EDT Ventricular Rate:  95 PR Interval:  168 QRS Duration: 80 QT  Interval:  324 QTC Calculation: 407 R Axis:   53 Text Interpretation:  Normal sinus rhythm Normal ECG No significant change  since last tracing Confirmed by [place] ([id number]) on 05/30/2014  6:51:31 PM      MDM   Final diagnoses:  Dyspepsia    Patient here complaining of chest and back pain. Back pain is consistent with a muscular skeletal back pain based on reproducible pain with range of motion palpation. Patient also describing a chest discomfort which he states is consistent with pain he has experienced in the past associated with his GERD. Patient asymptomatic while in the ER. Patient stating his chest discomfort is worsened with reclining, worsened after eating, improved with drinking water, improved with exercise and movement. No concern for ACS. Workup overall unremarkable here. EKG without evidence of injury or ectopy. 2 troponins 0.00 in the ER. Lab work reassuring. Chest x-ray without acute abnormalities. Heart score 1. PERC negative. History and physical inconsistent with cardiac or anginal equivalent. Likely patient is experiencing muscular skeletal back pain as well as concomitant GERD/dyspepsia symptoms. Patient to be discharged at this time, and strongly encouraged patient to follow up with his primary care physician. Discussed return precautions with patient, patient verbalizes understanding and agreement with this plan. I encouraged patient to call or return to ER with any worsening of symptoms or should he have a questions or concerns.  BP 122/94 mmHg  Pulse 83  Temp(Src) 98.2 F (36.8 C) (Oral)  Resp 18  SpO2 100%  Signed,  Dahlia Bailiff, PA-C 11:49 PM  Patient discussed with Dr. Ernestina Patches, MD  Dahlia Bailiff, PA-C 05/30/14 2349  Ernestina Patches, MD 05/31/14 [id number]

## 2014-05-30 NOTE — ED Notes (Signed)
Pt reports increased in cp since yesterday that radiates through to back. Also  Having L arm and leg numbness. Pt sts several times today it has had palpitations and feeling winded.

## 2014-05-30 NOTE — Discharge Instructions (Signed)
Gastroesophageal Reflux Disease, Adult  Gastroesophageal reflux disease (GERD) happens when acid from your stomach flows up into the esophagus. When acid comes in contact with the esophagus, the acid causes soreness (inflammation) in the esophagus. Over time, GERD may create small holes (ulcers) in the lining of the esophagus.  CAUSES    Increased body weight. This puts pressure on the stomach, making acid rise from the stomach into the esophagus.   Smoking. This increases acid production in the stomach.   Drinking alcohol. This causes decreased pressure in the lower esophageal sphincter (valve or ring of muscle between the esophagus and stomach), allowing acid from the stomach into the esophagus.   Late evening meals and a full stomach. This increases pressure and acid production in the stomach.   A malformed lower esophageal sphincter.  Sometimes, no cause is found.  SYMPTOMS    Burning pain in the lower part of the mid-chest behind the breastbone and in the mid-stomach area. This may occur twice a week or more often.   Trouble swallowing.   Sore throat.   Dry cough.   Asthma-like symptoms including chest tightness, shortness of breath, or wheezing.  DIAGNOSIS   Your caregiver may be able to diagnose GERD based on your symptoms. In some cases, X-rays and other tests may be done to check for complications or to check the condition of your stomach and esophagus.  TREATMENT   Your caregiver may recommend over-the-counter or prescription medicines to help decrease acid production. Ask your caregiver before starting or adding any new medicines.   HOME CARE INSTRUCTIONS    Change the factors that you can control. Ask your caregiver for guidance concerning weight loss, quitting smoking, and alcohol consumption.   Avoid foods and drinks that make your symptoms worse, such as:   Caffeine or alcoholic drinks.   Chocolate.   Peppermint or mint flavorings.   Garlic and onions.   Spicy foods.   Citrus fruits,  such as oranges, lemons, or limes.   Tomato-based foods such as sauce, chili, salsa, and pizza.   Fried and fatty foods.   Avoid lying down for the 3 hours prior to your bedtime or prior to taking a nap.   Eat small, frequent meals instead of large meals.   Wear loose-fitting clothing. Do not wear anything tight around your waist that causes pressure on your stomach.   Raise the head of your bed 6 to 8 inches with wood blocks to help you sleep. Extra pillows will not help.   Only take over-the-counter or prescription medicines for pain, discomfort, or fever as directed by your caregiver.   Do not take aspirin, ibuprofen, or other nonsteroidal anti-inflammatory drugs (NSAIDs).  SEEK IMMEDIATE MEDICAL CARE IF:    You have pain in your arms, neck, jaw, teeth, or back.   Your pain increases or changes in intensity or duration.   You develop nausea, vomiting, or sweating (diaphoresis).   You develop shortness of breath, or you faint.   Your vomit is green, yellow, black, or looks like coffee grounds or blood.   Your stool is red, bloody, or black.  These symptoms could be signs of other problems, such as heart disease, gastric bleeding, or esophageal bleeding.  MAKE SURE YOU:    Understand these instructions.   Will watch your condition.   Will get help right away if you are not doing well or get worse.  Document Released: 12/03/2004 Document Revised: 05/18/2011 Document Reviewed: 09/12/2010  [employer] Patient   Information 2015 [employer]. This information is not intended to replace advice given to you by your health care provider. Make sure you discuss any questions you have with your health care provider.  Chest Pain (Nonspecific)  It is often hard to give a specific diagnosis for the cause of chest pain. There is always a chance that your pain could be related to something serious, such as a heart attack or a blood clot in the lungs. You need to follow up with your health care provider for further  evaluation.  CAUSES    Heartburn.   Pneumonia or bronchitis.   Anxiety or stress.   Inflammation around your heart (pericarditis) or lung (pleuritis or pleurisy).   A blood clot in the lung.   A collapsed lung (pneumothorax). It can develop suddenly on its own (spontaneous pneumothorax) or from trauma to the chest.   Shingles infection (herpes zoster virus).  The chest wall is composed of bones, muscles, and cartilage. Any of these can be the source of the pain.   The bones can be bruised by injury.   The muscles or cartilage can be strained by coughing or overwork.   The cartilage can be affected by inflammation and become sore (costochondritis).  DIAGNOSIS   Lab tests or other studies may be needed to find the cause of your pain. Your health care provider may have you take a test called an ambulatory electrocardiogram (ECG). An ECG records your heartbeat patterns over a 24-hour period. You may also have other tests, such as:   Transthoracic echocardiogram (TTE). During echocardiography, sound waves are used to evaluate how blood flows through your heart.   Transesophageal echocardiogram (TEE).   Cardiac monitoring. This allows your health care provider to monitor your heart rate and rhythm in real time.   Holter monitor. This is a portable device that records your heartbeat and can help diagnose heart arrhythmias. It allows your health care provider to track your heart activity for several days, if needed.   Stress tests by exercise or by giving medicine that makes the heart beat faster.  TREATMENT    Treatment depends on what may be causing your chest pain. Treatment may include:   Acid blockers for heartburn.   Anti-inflammatory medicine.   Pain medicine for inflammatory conditions.   Antibiotics if an infection is present.   You may be advised to change lifestyle habits. This includes stopping smoking and avoiding alcohol, caffeine, and chocolate.   You may be advised to keep your head  raised (elevated) when sleeping. This reduces the chance of acid going backward from your stomach into your esophagus.  Most of the time, nonspecific chest pain will improve within 2-3 days with rest and mild pain medicine.   HOME CARE INSTRUCTIONS    If antibiotics were prescribed, take them as directed. Finish them even if you start to feel better.   For the next few days, avoid physical activities that bring on chest pain. Continue physical activities as directed.   Do not use any tobacco products, including cigarettes, chewing tobacco, or electronic cigarettes.   Avoid drinking alcohol.   Only take medicine as directed by your health care provider.   Follow your health care provider's suggestions for further testing if your chest pain does not go away.   Keep any follow-up appointments you made. If you do not go to an appointment, you could develop lasting (chronic) problems with pain. If there is any problem keeping an appointment, call   to reschedule.  SEEK MEDICAL CARE IF:    Your chest pain does not go away, even after treatment.   You have a rash with blisters on your chest.   You have a fever.  SEEK IMMEDIATE MEDICAL CARE IF:    You have increased chest pain or pain that spreads to your arm, neck, jaw, back, or abdomen.   You have shortness of breath.   You have an increasing cough, or you cough up blood.   You have severe back or abdominal pain.   You feel nauseous or vomit.   You have severe weakness.   You faint.   You have chills.  This is an emergency. Do not wait to see if the pain will go away. Get medical help at once. Call your local emergency services (911 in [place] Do not drive yourself to the hospital.  MAKE SURE YOU:    Understand these instructions.   Will watch your condition.   Will get help right away if you are not doing well or get worse.  Document Released: 12/03/2004 Document Revised: 02/28/2013 Document Reviewed: 09/29/2007  [employer] Patient Information 2015  [employer]. This information is not intended to replace advice given to you by your health care provider. Make sure you discuss any questions you have with your health care provider.

## 2014-07-17 ENCOUNTER — Encounter: Payer: Self-pay | Admitting: Family Medicine

## 2014-07-17 ENCOUNTER — Ambulatory Visit (INDEPENDENT_AMBULATORY_CARE_PROVIDER_SITE_OTHER): Payer: BLUE CROSS/BLUE SHIELD | Admitting: Family Medicine

## 2014-07-17 VITALS — BP 134/88 | Ht 72.0 in | Wt 243.0 lb

## 2014-07-17 DIAGNOSIS — L723 Sebaceous cyst: Secondary | ICD-10-CM

## 2014-07-17 NOTE — Progress Notes (Signed)
   Subjective:    Patient ID: Jeffrey [name], male    DOB: Jul 29, 1970, 44 y.o.   MRN: [id number]  HPICyst on forehead for the past 3 -4 weeks. Was tender to touch but not now.  Patient relates that he's had this area for at least 3-4 weeks ago got much bigger started get a little bit better but now is been present for the past 3 weeks without changing   Review of Systems He denies any pain drainage fever chills    Objective:   Physical Exam Affirm enlarged area approximately 5 mm x 4 mm x 3 mm on the right forehead underneath where her previous medications recur       Assessment & Plan:  Gone 27 through 31 of May  What appears to be a sebaceous cyst versus scar tissue I doubt that this is cancer but I do believe this needs to be removed and sent off.

## 2014-08-14 ENCOUNTER — Ambulatory Visit: Payer: Self-pay | Admitting: Urology

## 2014-09-18 ENCOUNTER — Ambulatory Visit: Payer: Self-pay | Admitting: Urology

## 2015-07-11 ENCOUNTER — Encounter: Payer: Self-pay | Admitting: Family Medicine

## 2015-07-11 ENCOUNTER — Ambulatory Visit (INDEPENDENT_AMBULATORY_CARE_PROVIDER_SITE_OTHER): Payer: BLUE CROSS/BLUE SHIELD | Admitting: Family Medicine

## 2015-07-11 VITALS — BP 128/84 | Ht 72.0 in | Wt 247.0 lb

## 2015-07-11 DIAGNOSIS — Z139 Encounter for screening, unspecified: Secondary | ICD-10-CM | POA: Diagnosis not present

## 2015-07-11 DIAGNOSIS — M546 Pain in thoracic spine: Secondary | ICD-10-CM | POA: Diagnosis not present

## 2015-07-11 NOTE — Progress Notes (Signed)
   Subjective:    Patient ID: Jeffrey [name], male    DOB: 1970-05-27, 45 y.o.   MRN: [id number]  Back Pain This is a chronic problem. The current episode started more than 1 year ago (off and on for over one year. getting worse). (Mid and upper back, neck pain) Treatments tried: tylenol, ibuprofen, otc meds.  This patient has had approximately 8-9 year history of back pain that radiates to the left side radiates ran into the chest and also over the past 6 months is been radiating into the right side as well he states over the past 6 months of pain is become severe it affects his daily living makes it difficult for him to move about certain movements cause increased pain makes it difficult for him to work and wakes him up at nighttime he's tried anti-inflammatories 800 mg Motrin 3 times a day without success. Denies any arm tingling denies arm weakness numbness denies leg weakness or numbness. Denies headaches fever chills no chest pressure or tightness. No shortness of breath nausea vomiting or diarrhea. The patient has seen several specialists in the past he had MRI back in 2009 but he states the pain is significantly worse over the past year and worse in the past 6 months despite conservative measures and therapy with exercise stretching and anti-inflammatories    Review of Systems  Musculoskeletal: Positive for back pain.   Please see above    Objective:   Physical Exam  Lungs are clear hearts regular pulse normal BP good abdomen soft patient was subjected discomfort paraspinal muscles on the left and the right rhomboid region worse on the left side radiates into the underneath the left scapula and then he states radiates around to the front of his chest.      Assessment & Plan:  Greater than 25 minutes was spent with the patient discussing multiple different aspects. There is concerned that there could be a herniated disc or pinched nerve within the foramina of the thoracic spine. I  believe this is what's causing his increased pain and discomfort. I did discuss with him in detail Lyrica gabapentin and Cymbalta. He would like to defer on these medications until further delineation of the problem is noted He does state that he is willing to go forward with MRI. Based upon the results of the MRI we may need to get him set up with orthopedics specialist for the back or possibly neurosurgical. Currently right now would not recommend injections until we have a better idea what is going on.

## 2015-07-16 ENCOUNTER — Telehealth: Payer: Self-pay | Admitting: *Deleted

## 2015-07-16 NOTE — Telephone Encounter (Signed)
MRI scheduled tomorrow. Need to do peer to peer for approval.

## 2015-07-16 NOTE — Telephone Encounter (Signed)
Peer-to-peer review was completed and approved

## 2015-07-17 ENCOUNTER — Ambulatory Visit (HOSPITAL_COMMUNITY)
Admission: RE | Admit: 2015-07-17 | Discharge: 2015-07-17 | Disposition: A | Discharge status: Home / Self Care | Payer: BLUE CROSS/BLUE SHIELD | Source: Ambulatory Visit | Attending: Family Medicine | Admitting: Family Medicine

## 2015-07-17 DIAGNOSIS — M5124 Other intervertebral disc displacement, thoracic region: Secondary | ICD-10-CM | POA: Diagnosis not present

## 2015-07-17 DIAGNOSIS — M546 Pain in thoracic spine: Secondary | ICD-10-CM | POA: Insufficient documentation

## 2015-08-27 ENCOUNTER — Ambulatory Visit (INDEPENDENT_AMBULATORY_CARE_PROVIDER_SITE_OTHER): Payer: BLUE CROSS/BLUE SHIELD | Admitting: Urology

## 2015-08-27 DIAGNOSIS — N5201 Erectile dysfunction due to arterial insufficiency: Secondary | ICD-10-CM

## 2015-08-27 DIAGNOSIS — E291 Testicular hypofunction: Secondary | ICD-10-CM | POA: Diagnosis not present

## 2015-10-10 ENCOUNTER — Telehealth: Payer: Self-pay | Admitting: Family Medicine

## 2015-10-10 DIAGNOSIS — D2339 Other benign neoplasm of skin of other parts of face: Secondary | ICD-10-CM

## 2015-10-10 NOTE — Telephone Encounter (Signed)
Pt called needing a referral for cyst on his forehead  (Originally called [facility] that stated they do not do facial surgery and recommended dermatology)  We referred him to dermatology for this before, they did try to lance it, they stated that the cyst was hard and would need to be removed at a day surgery facility.  Pt states cyst went away for a while after seeing dermatology, but has since returned    Please advise?  Referral to Plastic Surgery?    (Informed patient that Dr. Nicki Reaper is out of the office until [place] verbalized understanding)

## 2015-10-11 NOTE — Telephone Encounter (Signed)
Plastic surgery is a good idea- may proceed

## 2015-10-11 NOTE — Telephone Encounter (Signed)
Referral ordered in EPIC. 

## 2015-11-08 ENCOUNTER — Other Ambulatory Visit: Payer: Self-pay | Admitting: Plastic Surgery

## 2015-11-18 ENCOUNTER — Telehealth: Payer: Self-pay | Admitting: Family Medicine

## 2015-11-18 NOTE — Telephone Encounter (Signed)
Patient was diagnosed with Sarcoidosis when he went to his plastic surgeon to have a place cut off of his forehead last week.  He wanted to schedule an appointment with Dr. Nicki Reaper to address this, but his next available is not for another couple of weeks.  Should we work him in to address this?

## 2015-11-18 NOTE — Telephone Encounter (Signed)
Patient may have follow-up visit next week or this week just not this Thursday thank you

## 2015-11-18 NOTE — Telephone Encounter (Signed)
Patient may have follow-up visit this week or next week preferably not Thursday

## 2015-11-19 NOTE — Telephone Encounter (Signed)
Appt scheduled

## 2015-11-22 ENCOUNTER — Ambulatory Visit (INDEPENDENT_AMBULATORY_CARE_PROVIDER_SITE_OTHER): Payer: BLUE CROSS/BLUE SHIELD | Admitting: Family Medicine

## 2015-11-22 ENCOUNTER — Encounter: Payer: Self-pay | Admitting: Family Medicine

## 2015-11-22 VITALS — BP 124/82 | Temp 98.8°F | Ht 72.0 in | Wt 250.0 lb

## 2015-11-22 DIAGNOSIS — J019 Acute sinusitis, unspecified: Secondary | ICD-10-CM | POA: Diagnosis not present

## 2015-11-22 DIAGNOSIS — D869 Sarcoidosis, unspecified: Secondary | ICD-10-CM | POA: Diagnosis not present

## 2015-11-22 DIAGNOSIS — R0600 Dyspnea, unspecified: Secondary | ICD-10-CM

## 2015-11-22 MED ORDER — AMOXICILLIN 500 MG PO TABS: 500.0000 mg | ORAL_TABLET | Freq: Three times a day (TID) | ORAL | 0 refills | Status: DC

## 2015-11-22 NOTE — Progress Notes (Signed)
   Subjective:    Patient ID: Jeffrey [name], male    DOB: [birth year], 45 y.o.   MRN: [id number]  HPIFollow up on sarcoidosis diagnosis. He had a cyst on his forehead we recommended for him to be seen by a dermatologist or plastic surgeon. He was seen by plastic surgery they remove this and found evidence that points toward the possibility of sarcoidosis. He states he has other areas popping up on his right arm that have a similar appearance he denies any chest tightness pressure pain cough wheezing difficulty breathing cardiac symptoms or shortness of breath  Soreness on right side of throat and congestion. Started 3 -4 days ago.  He does relate head congestion drainage coughing   Review of Systems Denies any chest tightness pressure pain shortness breath he does have a chronic left side chest pain comes and goes been thoroughly worked up in the past and felt to be an impingement of a thoracic nerve he denies night sweats fever chills    Objective:   Physical Exam  Lungs are clear hearts regular neck no masses HEENT is benign extremities no edema mild sinus tenderness      Assessment & Plan:  Upper respiratory illness secondary rhinosinusitis antibiotics prescribed  Possibility of sarcoid I recommend further evaluation make sure there is no sinus sarcoid in the lungs heart. EKG looks good awaiting pulmonary function chest x-ray as well as lab work may need referral to a dermatology specialist or sarcoid specialist await the results of all this testing first

## 2015-11-24 LAB — BASIC METABOLIC PANEL
BUN/Creatinine Ratio: 15 (ref 9–20)
BUN: 14 mg/dL (ref 6–24)
CO2: 27 mmol/L (ref 18–29)
Calcium: 9.6 mg/dL (ref 8.7–10.2)
Chloride: 100 mmol/L (ref 96–106)
Creatinine, Ser: 0.93 mg/dL (ref 0.76–1.27)
GFR calc Af Amer: 117 mL/min/{1.73_m2} (ref >59)
GFR calc non Af Amer: 101 mL/min/{1.73_m2} (ref >59)
Glucose: 90 mg/dL (ref 65–99)
Potassium: 4.7 mmol/L (ref 3.5–5.2)
Sodium: 141 mmol/L (ref 134–144)

## 2015-11-24 LAB — CBC WITH DIFFERENTIAL/PLATELET
Basophils Absolute: 0 10*3/uL (ref 0.0–0.2)
Basos: 0 %
EOS (ABSOLUTE): 0.1 10*3/uL (ref 0.0–0.4)
Eos: 2 %
Hematocrit: 41.7 % (ref 37.5–51.0)
Hemoglobin: 14.8 g/dL (ref 12.6–17.7)
Immature Grans (Abs): 0 10*3/uL (ref 0.0–0.1)
Immature Granulocytes: 0 %
Lymphocytes Absolute: 1.2 10*3/uL (ref 0.7–3.1)
Lymphs: 26 %
MCH: 32.2 pg (ref 26.6–33.0)
MCHC: 35.5 g/dL (ref 31.5–35.7)
MCV: 91 fL (ref 79–97)
Monocytes Absolute: 0.5 10*3/uL (ref 0.1–0.9)
Monocytes: 12 %
Neutrophils Absolute: 2.7 10*3/uL (ref 1.4–7.0)
Neutrophils: 60 %
Platelets: 177 10*3/uL (ref 150–379)
RBC: 4.59 x10E6/uL (ref 4.14–5.80)
RDW: 12.9 % (ref 12.3–15.4)
WBC: 4.5 10*3/uL (ref 3.4–10.8)

## 2015-11-24 LAB — HEPATIC FUNCTION PANEL
ALT: 32 IU/L (ref 0–44)
AST: 24 IU/L (ref 0–40)
Albumin: 4.8 g/dL (ref 3.5–5.5)
Alkaline Phosphatase: 81 IU/L (ref 39–117)
Bilirubin Total: 0.3 mg/dL (ref 0.0–1.2)
Bilirubin, Direct: 0.09 mg/dL (ref 0.00–0.40)
Total Protein: 7.9 g/dL (ref 6.0–8.5)

## 2015-11-24 LAB — ANGIOTENSIN CONVERTING ENZYME: Angio Convert Enzyme: 20 U/L (ref 14–82)

## 2015-11-26 ENCOUNTER — Encounter: Payer: Self-pay | Admitting: Family Medicine

## 2015-11-26 ENCOUNTER — Ambulatory Visit: Payer: BLUE CROSS/BLUE SHIELD

## 2015-11-27 ENCOUNTER — Ambulatory Visit (INDEPENDENT_AMBULATORY_CARE_PROVIDER_SITE_OTHER): Payer: BLUE CROSS/BLUE SHIELD

## 2015-11-27 DIAGNOSIS — Z111 Encounter for screening for respiratory tuberculosis: Secondary | ICD-10-CM

## 2015-11-29 LAB — TB SKIN TEST
Induration: 0 mm
TB Skin Test: NEGATIVE

## 2015-12-04 ENCOUNTER — Ambulatory Visit (HOSPITAL_COMMUNITY)
Admission: RE | Admit: 2015-12-04 | Discharge: 2015-12-04 | Disposition: A | Discharge status: Home / Self Care | Payer: BLUE CROSS/BLUE SHIELD | Source: Ambulatory Visit | Attending: Family Medicine | Admitting: Family Medicine

## 2015-12-04 DIAGNOSIS — D869 Sarcoidosis, unspecified: Secondary | ICD-10-CM | POA: Diagnosis present

## 2015-12-04 LAB — PULMONARY FUNCTION TEST
DL/VA % pred: 131 %
DL/VA: 6.25 ml/min/mmHg/L
DLCO cor % pred: 110 %
DLCO cor: 38.7 ml/min/mmHg
DLCO unc % pred: 111 %
DLCO unc: 38.92 ml/min/mmHg
FEF 25-75 Pre: 1.31 L/sec
FEF2575-%Pred-Pre: 32 %
FEV1-%Pred-Pre: 54 %
FEV1-Pre: 2.37 L
FEV1FVC-%Pred-Pre: 79 %
FEV6-%Pred-Pre: 67 %
FEV6-Pre: 3.65 L
FEV6FVC-%Pred-Pre: 99 %
FVC-%Pred-Pre: 67 %
FVC-Pre: 3.78 L
Pre FEV1/FVC ratio: 63 %
Pre FEV6/FVC Ratio: 97 %

## 2015-12-10 NOTE — Addendum Note (Signed)
Addended by: Ofilia Neas R on: 12/10/2015 01:41 PM   Modules accepted: Orders

## 2015-12-12 ENCOUNTER — Encounter: Payer: Self-pay | Admitting: Family Medicine

## 2015-12-13 ENCOUNTER — Encounter: Payer: Self-pay | Admitting: Family Medicine

## 2015-12-18 ENCOUNTER — Encounter: Payer: Self-pay | Admitting: Pulmonary Disease

## 2015-12-18 ENCOUNTER — Ambulatory Visit (INDEPENDENT_AMBULATORY_CARE_PROVIDER_SITE_OTHER): Payer: BLUE CROSS/BLUE SHIELD | Admitting: Pulmonary Disease

## 2015-12-18 ENCOUNTER — Other Ambulatory Visit: Payer: BLUE CROSS/BLUE SHIELD

## 2015-12-18 VITALS — BP 122/78 | HR 84 | Ht 72.0 in | Wt 245.0 lb

## 2015-12-18 DIAGNOSIS — J449 Chronic obstructive pulmonary disease, unspecified: Secondary | ICD-10-CM | POA: Insufficient documentation

## 2015-12-18 DIAGNOSIS — J439 Emphysema, unspecified: Secondary | ICD-10-CM

## 2015-12-18 MED ORDER — ALBUTEROL SULFATE HFA 108 (90 BASE) MCG/ACT IN AERS: 2.0000 | INHALATION_SPRAY | Freq: Four times a day (QID) | RESPIRATORY_TRACT | 6 refills | Status: DC | PRN

## 2015-12-18 NOTE — Assessment & Plan Note (Signed)
Patient is being seen for abnormal PFT. Patient has a 37 PY smoking history, quit 10 yrs ago, not known to have asthma or copd. occasional marijuana use.  Pt had spirometry done in 12/04/2015. FEV1/FVC ratio was 63%, FEV1 was decreased at 2.37 or 54% predicted, diffusing capacity was normal at 100%.  Patient with exertional dyspnea, chronic, not necessarily worse. He gets winded with doing more than his ADLs.  CXR (05/2014)  No infiltrate.   Plan : We extensively discussed the diagnosis, pathophysiology, and medications  for COPD.   Patient will need : Chest XRay PA-L Alpha one antitrypsin level (if not yet done) 6MWT > on f/u if more symptomatic.  Nocturnal oximetry > on f/u if more symptomatic.   We will start patient on  Albuterol, 2 puffs every fours as needed. I told him, if he gets more symptomatic and uses the albuterol more than 3 times a week, he should call the office. At that point, he will probably need to be on maintenance medication such as Symbicort or Dulera   Influenza vaccine -- we will give today. Pneumococcal vaccine -- advised regarding this.  Patient was instructed to call the office if he/she has issues with the medications.   Patient was instructed to call the office if he/she is having issues with shortness of breath (i.e. COPD exacerbation).   Patient was instructed to call the office if with increased shortness of breath, cough, sputum production, fevers. Patient may need to be seen at the office for work-up.   Counseled patient on the importance of NOT smoking.  Counseled regarding stopping marijuana use.

## 2015-12-18 NOTE — Patient Instructions (Signed)
It was a pleasure taking care of you today!  You are diagnosed with Chronic Obstructive Pulmonary Disease or COPD.  COPD is a preventable and treatable disease that makes it difficult to empty air out of the lungs (airflow obstruction).  This can lead to shortness of breath.   Sometimes, when you have a lung infection, this can make your breathing worse, and will cause you to have a COPD flare-up or an acute exacerbation of COPD. Please call your primary care doctor or the office if you are having a COPD flare-up.   Smoking makes COPD worse.   Rescue medications: Albuterol 2 puffs every 4 hours as needed for shortness of breath.  Please let us know if you are using the albuterol more frequently.   Please call the office if you are having issues with your medications  We will get blood test today.   Return to clinic in 6 months.

## 2015-12-18 NOTE — Progress Notes (Signed)
Subjective:    Patient ID: Jeffrey [name], male    DOB: 1971-02-13, 45 y.o.   MRN: [id number]  HPI   This is the case of Jeffrey [name], 45 y.o. Male, who was referred by Dr. Sallee Lange in consultation regarding abnormal PFTs.   As you very well know, patient has a 74 PY smoking history, quit 10 yrs ago, not known to have asthma or copd.  Patient had PFT done because of concern for sarcoidosis. He had a lesion in his right forehead related to trauma. He first had it when he was 45 years old. He hit his forehead and had a lesion, most likely keloid, related to trauma. He hit the same spot again in 2011, making the lesion more prominent. Last year, he noticed that there was a cyst in the area of the keloid. He mentioned it to his primary care doctor. He ended up having excisional biopsy in August 2017.  Per patient, results of the biopsy was suggestive of sarcoidosis. Patient is scheduled to see a dermatologist for this. Because of concern for possible sarcoidosis, he ended up getting PFT.  Patient has had exertional dyspnea since 20 years ago, during the time that he started smoking cigarettes. Exertional dyspnea is not significantly worse.   Patient had spirometry done in 12/04/2015. FEV1/FVC ratio was 63%, FEV1 was decreased at 2.37 or 54% predicted, diffusing capacity was normal at 100%.     Review of Systems  Constitutional: Negative.  Negative for fever and unexpected weight change.       Gained 20 lbs over 3 yrs.   HENT: Negative.  Negative for congestion, dental problem, ear pain, nosebleeds, postnasal drip, rhinorrhea, sinus pressure, sneezing, sore throat and trouble swallowing.   Eyes: Negative.  Negative for redness and itching.  Respiratory: Positive for cough, shortness of breath and wheezing. Negative for chest tightness.   Cardiovascular: Negative.  Negative for palpitations and leg swelling.  Gastrointestinal: Negative.  Negative for nausea and vomiting.    Endocrine: Negative.   Genitourinary: Negative.  Negative for dysuria.  Musculoskeletal: Positive for arthralgias and myalgias. Negative for joint swelling.  Skin: Negative.  Negative for rash.  Allergic/Immunologic: Negative.   Neurological: Negative.  Negative for headaches.  Hematological: Negative.  Does not bruise/bleed easily.  Psychiatric/Behavioral: Negative.  Negative for dysphoric mood. The patient is not nervous/anxious.    Past Medical History:  Diagnosis Date  . GERD (gastroesophageal reflux disease)    (-) CA, DVT  Family History  Problem Relation Age of Onset  . Colon cancer Neg Hx   . Colon polyps Neg Hx      Past Surgical History:  Procedure Laterality Date  . cardiac stress test    . CHOLECYSTECTOMY    . COLONOSCOPY N/A 09/19/2013   Procedure: COLONOSCOPY;  Surgeon: [name], MD;  [facility]: [facility];  Service: Endoscopy;  Laterality: N/A;  10:00 - moved to 10:45 [name] to notify pt  . ESOPHAGOGASTRODUODENOSCOPY N/A 09/19/2013   Procedure: ESOPHAGOGASTRODUODENOSCOPY (EGD);  Surgeon: [name], MD;  Location: [facility];  Service: Endoscopy;  Laterality: N/A;  . ESOPHAGOGASTRODUODENOSCOPY ENDOSCOPY    . MALONEY DILATION N/A 09/19/2013   Procedure: MALONEY DILATION;  Surgeon: [name], MD;  Location: [facility];  Service: Endoscopy;  Laterality: N/A;  . SAVORY DILATION N/A 09/19/2013   Procedure: SAVORY DILATION;  Surgeon: [name], MD;  Location: [facility];  Service: Endoscopy;  Laterality: N/A;  Social History   Social History  . Marital status: Divorced    Spouse name: N/A  . Number of children: N/A  . Years of education: N/A   Occupational History  . Not on file.   Social History Main Topics  . Smoking status: Former Research scientist (life sciences)  . Smokeless tobacco: Current User  . Alcohol use 7.2 oz/week    12 Cans of beer per week  . Drug use:     Types: Marijuana  . Sexual activity: Not on file   Other Topics Concern  .  Not on file   Social History Narrative   OWNS [place]. RACES CARS WITH NITRO. STEP DAUGHTER RACES AS WELL.   Has a GF, lives in [place]. Occasional ETOH.   No Known Allergies   Outpatient Medications Prior to Visit  Medication Sig Dispense Refill  . acetaminophen (TYLENOL) 500 MG tablet Take 500 mg by mouth every 6 (six) hours as needed for mild pain, moderate pain or headache.    . esomeprazole (NEXIUM) 40 MG capsule Take 1 capsule (40 mg total) by mouth 2 (two) times daily before a meal. 60 capsule 11  . ibuprofen (ADVIL,MOTRIN) 200 MG tablet Take 400 mg by mouth every 8 (eight) hours as needed for moderate pain.     . tadalafil (CIALIS) 5 MG tablet Take 5 mg by mouth daily.    . vitamin C (ASCORBIC ACID) 500 MG tablet Take 500 mg by mouth daily.    Marland Kitchen amoxicillin (AMOXIL) 500 MG tablet Take 1 tablet (500 mg total) by mouth 3 (three) times daily. 30 tablet 0   No facility-administered medications prior to visit.    Meds ordered this encounter  Medications  . Multiple Vitamin (MULTIVITAMIN) tablet    Sig: Take 1 tablet by mouth daily.  Marland Kitchen albuterol (PROVENTIL HFA;VENTOLIN HFA) 108 (90 Base) MCG/ACT inhaler    Sig: Inhale 2 puffs into the lungs every 6 (six) hours as needed for wheezing or shortness of breath.    Dispense:  1 Inhaler    Refill:  6         Objective:   Physical Exam Vitals:  Vitals:   12/18/15 0914  BP: 122/78  Pulse: 84  SpO2: 95%  Weight: 245 lb (111.1 kg)  Height: 6' (1.829 m)    Constitutional/General:  Pleasant, well-nourished, well-developed, not in any distress,  Comfortably seating.  Well kempt  Body mass index is 33.23 kg/m. Wt Readings from Last 3 Encounters:  12/18/15 245 lb (111.1 kg)  11/22/15 250 lb (113.4 kg)  07/11/15 247 lb (112 kg)    Neck circumference:   HEENT: Pupils equal and reactive to light and accommodation. Anicteric sclerae. Normal nasal mucosa.   No oral  lesions,  mouth clear,  oropharynx clear, no  postnasal drip. (-) Oral thrush. No dental caries.  Airway - Mallampati class III  Neck: No masses. Midline trachea. No JVD, (-) LAD. (-) bruits appreciated.  Respiratory/Chest: Grossly normal chest. (-) deformity. (-) Accessory muscle use.  Symmetric expansion. (-) Tenderness on palpation.  Resonant on percussion.  Diminished BS on both lower lung zones. (-) wheezing, crackles, rhonchi (-) egophony  Cardiovascular: Regular rate and  rhythm, heart sounds normal, no murmur or gallops, no peripheral edema  Gastrointestinal:  Normal bowel sounds. Soft, non-tender. No hepatosplenomegaly.  (-) masses.   Musculoskeletal:  Normal muscle tone. Normal gait.   Extremities: Grossly normal. (-) clubbing, cyanosis.  (-) edema  Skin: (-) rash,lesions seen.   Neurological/Psychiatric :  alert, oriented to time, place, person. Normal mood and affect           Assessment & Plan:  COPD (chronic obstructive pulmonary disease) ([place]) Patient is being seen for abnormal PFT. Patient has a 50 PY smoking history, quit 10 yrs ago, not known to have asthma or copd. occasional marijuana use.  Pt had spirometry done in 12/04/2015. FEV1/FVC ratio was 63%, FEV1 was decreased at 2.37 or 54% predicted, diffusing capacity was normal at 100%.  Patient with exertional dyspnea, chronic, not necessarily worse. He gets winded with doing more than his ADLs.  CXR (05/2014)  No infiltrate.   Plan : We extensively discussed the diagnosis, pathophysiology, and medications  for COPD.   Patient will need : Chest XRay PA-L Alpha one antitrypsin level (if not yet done) 6MWT > on f/u if more symptomatic.  Nocturnal oximetry > on f/u if more symptomatic.   We will start patient on  Albuterol, 2 puffs every fours as needed. I told him, if he gets more symptomatic and uses the albuterol more than 3 times a week, he should call the office. At that point, he will probably need to be on maintenance medication such as  Symbicort or Dulera   Influenza vaccine -- we will give today. Pneumococcal vaccine -- advised regarding this.  Patient was instructed to call the office if he/she has issues with the medications.   Patient was instructed to call the office if he/she is having issues with shortness of breath (i.e. COPD exacerbation).   Patient was instructed to call the office if with increased shortness of breath, cough, sputum production, fevers. Patient may need to be seen at the office for work-up.   Counseled patient on the importance of NOT smoking.  Counseled regarding stopping marijuana use.   Patient has a lesion in his right forehead which is most likely reactive from trauma. He had excisional biopsy which showed changes possibly related to sarcoidosis. I think it's all granulation tissue related to trauma. No evidence of sarcoidosis at this point. Agree however for him to see dermatology. If  sarcoidosis ends up being a diagnosis, he will probably need a chest CT scan to better look at his lung parenchyma. I discussed this with the patient.    Thank you very much for letting me participate in this patient's care. Please do not hesitate to give me a call if you have any questions or concerns regarding the treatment plan.   Patient will follow up with me in 6 months    J. Shirl Harris, MD 12/18/2015   1:10 PM Pulmonary and [place] Pager: [phone] Office: [phone], Fax: [phone]

## 2015-12-21 LAB — ALPHA-1 ANTITRYPSIN PHENOTYPE: A-1 Antitrypsin: 125 mg/dL (ref 83–199)

## 2016-08-24 ENCOUNTER — Encounter: Payer: Self-pay | Admitting: Family Medicine

## 2016-08-24 ENCOUNTER — Ambulatory Visit (INDEPENDENT_AMBULATORY_CARE_PROVIDER_SITE_OTHER): Payer: 59 | Admitting: Family Medicine

## 2016-08-24 VITALS — BP 118/84 | Temp 98.4°F | Ht 72.0 in | Wt 257.1 lb

## 2016-08-24 DIAGNOSIS — J019 Acute sinusitis, unspecified: Secondary | ICD-10-CM | POA: Diagnosis not present

## 2016-08-24 MED ORDER — AMOXICILLIN-POT CLAVULANATE 875-125 MG PO TABS: 1.0000 | ORAL_TABLET | Freq: Two times a day (BID) | ORAL | 0 refills | Status: DC

## 2016-08-24 NOTE — Progress Notes (Signed)
   Subjective:    Patient ID: Jeffrey [name], male    DOB: 19-Oct-1970, 46 y.o.   MRN: [id number]  Sinusitis  This is a new problem. The current episode started 1 to 4 weeks ago. Associated symptoms include coughing, ear pain, headaches and a sore throat. Pertinent negatives include no shortness of breath. Past treatments include oral decongestants (allergy medication).   Patient relates a lot of sinus pressure drainage coughing not feeling good denies wheezing difficulty breathing does not smoke   Review of Systems  HENT: Positive for ear pain and sore throat.   Respiratory: Positive for cough. Negative for shortness of breath.   Neurological: Positive for headaches.       Objective:   Physical Exam Sinus mild tenderness throat normal neck supple lungs clear heart regular       Assessment & Plan:  Patient was seen today for upper respiratory illness. It is felt that the patient is dealing with sinusitis. Antibiotics were prescribed today. Importance of compliance with medication was discussed. Symptoms should gradually resolve over the course of the next several days. If high fevers, progressive illness, difficulty breathing, worsening condition or failure for symptoms to improve over the next several days then the patient is to follow-up. If any emergent conditions the patient is to follow-up in the emergency department otherwise to follow-up in the office. Patient not toxic Antibiotics prescribed follow-up if problems

## 2016-09-03 ENCOUNTER — Telehealth: Payer: Self-pay | Admitting: Family Medicine

## 2016-09-03 MED ORDER — LEVOFLOXACIN 500 MG PO TABS: 500.0000 mg | ORAL_TABLET | Freq: Every day | ORAL | 0 refills | Status: DC

## 2016-09-03 MED ORDER — FEXOFENADINE HCL 180 MG PO TABS: 180.0000 mg | ORAL_TABLET | Freq: Every day | ORAL | 5 refills | Status: DC

## 2016-09-03 NOTE — Telephone Encounter (Signed)
Spoke with patient and informed him per Dr.Scott Luking- Recommend Levaquin 500 milligrams 1 daily for 10 days, if ongoing troubles or if worse follow-up, also use allergy medicine generic for Allegra would be a good idea 180 mg fexofenadine 1 daily, it is also fine for you  to use over-the-counter Flonase 2 sprays each nostril daily over the course of the next 2 weeks. Patient verbalized understanding.

## 2016-09-03 NOTE — Telephone Encounter (Signed)
Pt is about finished with the antibiotics that he was recently prescribed and is not better. Pt thinks that he is getting worse. Can something stronger be called in?     [facility] [place]

## 2016-09-03 NOTE — Telephone Encounter (Signed)
Spoke with patient and patient has c/o eyes watering, head congestion, headache. Patient states he has 2 antibiotics pills left and is actually feeling worse. Please advise?

## 2016-09-03 NOTE — Telephone Encounter (Signed)
Recommend Levaquin 500 milligrams 1 daily for 10 days, if ongoing troubles or if worse follow-up, also use allergy medicine generic for Allegra would be a good idea 180 mg fexofenadine 1 daily, it is also fine for the patient to use over-the-counter Flonase 2 sprays each nostril daily over the course of the next 2 weeks

## 2017-03-30 ENCOUNTER — Ambulatory Visit (INDEPENDENT_AMBULATORY_CARE_PROVIDER_SITE_OTHER): Payer: PRIVATE HEALTH INSURANCE | Admitting: Urology

## 2017-03-30 DIAGNOSIS — N5201 Erectile dysfunction due to arterial insufficiency: Secondary | ICD-10-CM

## 2017-03-30 DIAGNOSIS — E291 Testicular hypofunction: Secondary | ICD-10-CM | POA: Diagnosis not present

## 2017-06-29 ENCOUNTER — Ambulatory Visit: Payer: PRIVATE HEALTH INSURANCE | Admitting: Urology

## 2017-07-27 ENCOUNTER — Ambulatory Visit: Payer: PRIVATE HEALTH INSURANCE | Admitting: Urology

## 2018-02-21 ENCOUNTER — Encounter: Payer: Self-pay | Admitting: Family Medicine

## 2018-02-21 ENCOUNTER — Telehealth: Payer: Self-pay | Admitting: Family Medicine

## 2018-02-21 ENCOUNTER — Ambulatory Visit (INDEPENDENT_AMBULATORY_CARE_PROVIDER_SITE_OTHER): Payer: 59 | Admitting: Family Medicine

## 2018-02-21 VITALS — BP 138/90 | Ht 72.0 in | Wt 269.0 lb

## 2018-02-21 DIAGNOSIS — Z131 Encounter for screening for diabetes mellitus: Secondary | ICD-10-CM

## 2018-02-21 DIAGNOSIS — Z13220 Encounter for screening for lipoid disorders: Secondary | ICD-10-CM | POA: Diagnosis not present

## 2018-02-21 DIAGNOSIS — M542 Cervicalgia: Secondary | ICD-10-CM

## 2018-02-21 MED ORDER — AMOXICILLIN 500 MG PO TABS: 500.0000 mg | ORAL_TABLET | Freq: Three times a day (TID) | ORAL | 0 refills | Status: DC

## 2018-02-21 MED ORDER — DICLOFENAC SODIUM 75 MG PO TBEC: 75.0000 mg | DELAYED_RELEASE_TABLET | Freq: Two times a day (BID) | ORAL | 0 refills | Status: DC

## 2018-02-21 NOTE — Progress Notes (Signed)
   Subjective:    Patient ID: Jeffrey [name], male    DOB: 27-Apr-1970, 47 y.o.   MRN: [id number]  HPI  Patient arrives with neck pain and stiffness for a while. Patient states it is becoming more consistent. Has tried stretches Does physical work He relates neck pain Gets sore in the neck region Has tried tylenol Aspirin can help He states it radiates into the right shoulder blade region does not radiate down the arm he states he did have trouble with the arm feeling weak but it is not feeling weak currently denies any tingling on that side denies any chest pressure or tightness  Mild cold sx for 5 days Review of Systems  Constitutional: Negative for activity change.  HENT: Negative for congestion and rhinorrhea.   Respiratory: Negative for cough and shortness of breath.   Cardiovascular: Negative for chest pain.  Gastrointestinal: Negative for abdominal pain, diarrhea, nausea and vomiting.  Genitourinary: Negative for dysuria and hematuria.  Neurological: Negative for weakness and headaches.  Psychiatric/Behavioral: Negative for behavioral problems and confusion.       Objective:   Physical Exam Vitals signs and nursing note reviewed.  Constitutional:      Appearance: He is well-developed.  HENT:     Head: Normocephalic.     Mouth/Throat:     Pharynx: No oropharyngeal exudate.  Neck:     Musculoskeletal: Normal range of motion.  Cardiovascular:     Rate and Rhythm: Normal rate and regular rhythm.     Heart sounds: Normal heart sounds. No murmur.  Pulmonary:     Effort: Pulmonary effort is normal.     Breath sounds: Normal breath sounds. No wheezing.  Lymphadenopathy:     Cervical: No cervical adenopathy.  Skin:    General: Skin is warm and dry.  Neurological:     Motor: No abnormal muscle tone.    Is strength bilateral is good good range of motion of the shoulders good range of motion of the neck       Assessment & Plan:  I believe the patient has a mild  cold with some secondary sinus we will go ahead with antibiotics  Neck pain discomfort possible impingement of a nerve recommend x-rays recommend conservative measures including muscle exercises stretching exercises anti-inflammatory follow-up in 6 to 8 weeks if ongoing trouble hold off on any MRI unless failing to respond to conservative measures over 8 weeks certainly if he starts getting weaker worse to follow-up sooner

## 2018-02-23 NOTE — Telephone Encounter (Signed)
error 

## 2018-03-14 ENCOUNTER — Telehealth: Payer: Self-pay | Admitting: Family Medicine

## 2018-03-14 MED ORDER — AMOXICILLIN-POT CLAVULANATE 875-125 MG PO TABS: 1.0000 | ORAL_TABLET | Freq: Two times a day (BID) | ORAL | 0 refills | Status: DC

## 2018-03-14 NOTE — Telephone Encounter (Signed)
Patient given Amoxil 500mg  on 02/22/2019

## 2018-03-14 NOTE — Telephone Encounter (Signed)
I would recommend Augmentin 875 mg 1 twice daily for 10 days let this patient know that this is a combination of amoxicillin with clavulanate therefore makes it into a stronger medicine take with a snack twice daily

## 2018-03-14 NOTE — Telephone Encounter (Signed)
If ongoing troubles follow-up

## 2018-03-14 NOTE — Telephone Encounter (Signed)
Prescription sent electronically to pharmacy. Patient notified. 

## 2018-03-14 NOTE — Telephone Encounter (Signed)
Patient was here on 02-21-18 and prescribed amoxicillin and started to get a little better but then finished antibiotic and now symptoms are coming back again.  No fever but sinuses are congested again and has a cough.  Patient wanted to know if we could call in another round of medicine?  [facility]

## 2018-07-12 ENCOUNTER — Ambulatory Visit (INDEPENDENT_AMBULATORY_CARE_PROVIDER_SITE_OTHER): Payer: 59 | Admitting: Family Medicine

## 2018-07-12 ENCOUNTER — Encounter: Payer: Self-pay | Admitting: Family Medicine

## 2018-07-12 ENCOUNTER — Other Ambulatory Visit: Payer: Self-pay

## 2018-07-12 DIAGNOSIS — J301 Allergic rhinitis due to pollen: Secondary | ICD-10-CM | POA: Diagnosis not present

## 2018-07-12 DIAGNOSIS — J019 Acute sinusitis, unspecified: Secondary | ICD-10-CM

## 2018-07-12 MED ORDER — AMOXICILLIN-POT CLAVULANATE 875-125 MG PO TABS: 1.0000 | ORAL_TABLET | Freq: Two times a day (BID) | ORAL | 0 refills | Status: AC

## 2018-07-12 NOTE — Addendum Note (Signed)
Addended by: Dairl Ponder on: 07/12/2018 11:51 AM   Modules accepted: Orders

## 2018-07-12 NOTE — Progress Notes (Signed)
   Subjective:    Patient ID: Jeffrey [name], male    DOB: [birth year], 48 y.o.   MRN: [id number] Audio plus video Sinus Problem  This is a new problem. The current episode started in the past 7 days. Associated symptoms include congestion, ear pain, sinus pressure and a sore throat.      Review of Systems  HENT: Positive for congestion, ear pain, sinus pressure and sore throat.    Virtual Visit via Video Note  I connected with Jeffrey [name] on 07/12/18 at 11:30 AM EDT by a video enabled telemedicine application and verified that I am speaking with the correct person using two identifiers.  Location: Patient: home Provider: office    I discussed the limitations of evaluation and management by telemedicine and the availability of in person appointments. The patient expressed understanding and agreed to proceed.  History of Present Illness:    Observations/Objective:   Assessment and Plan:   Follow Up Instructions:    I discussed the assessment and treatment plan with the patient. The patient was provided an opportunity to ask questions and all were answered. The patient agreed with the plan and demonstrated an understanding of the instructions.   The patient was advised to call back or seek an in-person evaluation if the symptoms worsen or if the condition fails to improve as anticipated.  I provided 15 minutes of non-face-to-face time during this encounter.   Patient has had 2 weeks worth of congestion.  Stuffiness.  Right drainage.  Right side primarily.  Very sore throat in the morning.  Sensitive right ear full.  Also experiencing allergy type symptoms when exposed outdoors      Objective:   Physical Exam   Virtual visit     Assessment & Plan:  Impression rhinosinusitis likely post viral, discussed with patient. plan antibiotics prescribed. Questions answered. Symptomatic care discussed. warning signs discussed. WSL Also allergic rhinitis.  Discussed.   Add Claritin as needed

## 2018-08-30 ENCOUNTER — Encounter: Payer: Self-pay | Admitting: Gastroenterology

## 2018-10-28 ENCOUNTER — Other Ambulatory Visit: Payer: Self-pay

## 2018-10-28 ENCOUNTER — Ambulatory Visit (INDEPENDENT_AMBULATORY_CARE_PROVIDER_SITE_OTHER): Payer: 59 | Admitting: Family Medicine

## 2018-10-28 DIAGNOSIS — J321 Chronic frontal sinusitis: Secondary | ICD-10-CM | POA: Diagnosis not present

## 2018-10-28 MED ORDER — FLUTICASONE PROPIONATE 50 MCG/ACT NA SUSP: 2.0000 | Freq: Every day | NASAL | 0 refills | Status: DC

## 2018-10-28 MED ORDER — CEFPROZIL 500 MG PO TABS: 500.0000 mg | ORAL_TABLET | Freq: Two times a day (BID) | ORAL | 0 refills | Status: DC

## 2018-10-28 NOTE — Progress Notes (Signed)
   Subjective:  Audio plus video  Patient ID: Jeffrey [name], male    DOB: December 31, 1970, 48 y.o.   MRN: [id number]  Sinus Problem This is a new problem. The current episode started in the past 7 days. Associated symptoms include congestion, headaches and sinus pressure. (Pain between eyes)      Review of Systems  HENT: Positive for congestion and sinus pressure.   Neurological: Positive for headaches.   Patient states he has history of recent sinus infection and is going out of town and wants something to knock it out before it gets worse.  No new exposures to anyone else sick.  Does work in a [employer] but they are using precautions.  Patient states this is an ongoing extension of the infection treated a couple months ago.  Never completely went away.  No fever no sore throat no cough coming from the chest     Objective:   Physical Exam   Virtual     Assessment & Plan:  Impression chronic sinusitis.  Discussed.  Protracted antibiotics prescribed.  Symptom care discussed.  Steroid nasal spray.  Doubt coronavirus.  Warning signs discussed

## 2018-10-29 ENCOUNTER — Encounter: Payer: Self-pay | Admitting: Family Medicine

## 2018-12-26 ENCOUNTER — Telehealth: Payer: Self-pay | Admitting: Family Medicine

## 2018-12-26 DIAGNOSIS — J329 Chronic sinusitis, unspecified: Secondary | ICD-10-CM

## 2018-12-26 NOTE — Telephone Encounter (Signed)
Pressure in between eyes, congestion comes and goes and its only on the right side. When he moves his neck on that side he can feel a pinch. States it started about 6 months ago. No fever.

## 2018-12-26 NOTE — Telephone Encounter (Signed)
Pt requesting referral due to recurrent sinus infections  Please advise & if ok to refer, please initiate referral in system so that I may process

## 2018-12-26 NOTE — Telephone Encounter (Signed)
He can have a referral to either [facility] or Dr.Teoh

## 2018-12-26 NOTE — Telephone Encounter (Signed)
Referral put in and pt notified. He did not have a prefrence

## 2019-02-06 ENCOUNTER — Ambulatory Visit (INDEPENDENT_AMBULATORY_CARE_PROVIDER_SITE_OTHER): Payer: No Typology Code available for payment source | Admitting: Otolaryngology

## 2019-02-06 ENCOUNTER — Other Ambulatory Visit: Payer: Self-pay

## 2019-02-06 DIAGNOSIS — J310 Chronic rhinitis: Secondary | ICD-10-CM | POA: Diagnosis not present

## 2019-02-06 DIAGNOSIS — J343 Hypertrophy of nasal turbinates: Secondary | ICD-10-CM | POA: Diagnosis not present

## 2019-02-06 DIAGNOSIS — R0982 Postnasal drip: Secondary | ICD-10-CM

## 2019-02-06 DIAGNOSIS — J351 Hypertrophy of tonsils: Secondary | ICD-10-CM

## 2019-02-14 ENCOUNTER — Other Ambulatory Visit: Payer: Self-pay

## 2019-02-14 ENCOUNTER — Ambulatory Visit (INDEPENDENT_AMBULATORY_CARE_PROVIDER_SITE_OTHER): Payer: No Typology Code available for payment source | Admitting: Family Medicine

## 2019-02-14 DIAGNOSIS — R232 Flushing: Secondary | ICD-10-CM

## 2019-02-14 DIAGNOSIS — M542 Cervicalgia: Secondary | ICD-10-CM

## 2019-02-14 MED ORDER — ALBUTEROL SULFATE HFA 108 (90 BASE) MCG/ACT IN AERS: 2.0000 | INHALATION_SPRAY | RESPIRATORY_TRACT | 4 refills | Status: DC | PRN

## 2019-02-14 NOTE — Progress Notes (Signed)
   Subjective:    Patient ID: Jeffrey [name], male    DOB: May 07, 1970, 48 y.o.   MRN: [id number]  Neck Pain  This is a chronic problem. Episode onset: off and on for over one year. Pertinent negatives include no chest pain or headaches.  Patient states neck pain radiates sometimes into the upper back sometimes into the upper arm on the right side Patient also states at times he feels hot and flushed all over and sometimes in the face he states he read that it could be what is referred to his carcinoid tumor.  He would like to have further evaluation.  He denies any numbness or tingling into the arms or legs Pt would like a refill on albuterol inhaler. Has not used it in over a year.   Virtual Visit via Telephone Note  I connected with Reece Packer on 02/14/19 at  2:00 PM EST by telephone and verified that I am speaking with the correct person using two identifiers.  Location: Patient: home Provider: office   I discussed the limitations, risks, security and privacy concerns of performing an evaluation and management service by telephone and the availability of in person appointments. I also discussed with the patient that there may be a patient responsible charge related to this service. The patient expressed understanding and agreed to proceed.   History of Present Illness:    Observations/Objective:   Assessment and Plan:   Follow Up Instructions:    I discussed the assessment and treatment plan with the patient. The patient was provided an opportunity to ask questions and all were answered. The patient agreed with the plan and demonstrated an understanding of the instructions.   The patient was advised to call back or seek an in-person evaluation if the symptoms worsen or if the condition fails to improve as anticipated.  I provided 17 minutes of non-face-to-face time during this encounter.       Review of Systems  Constitutional: Negative for diaphoresis and  fatigue.  HENT: Negative for congestion and rhinorrhea.   Respiratory: Negative for cough and shortness of breath.   Cardiovascular: Negative for chest pain and leg swelling.  Gastrointestinal: Negative for abdominal pain and diarrhea.  Musculoskeletal: Positive for neck pain.  Skin: Negative for color change and rash.  Neurological: Negative for dizziness and headaches.  Psychiatric/Behavioral: Negative for behavioral problems and confusion.       Objective:   Physical Exam  Today's visit was via telephone Physical exam was not possible for this visit       Assessment & Plan:  The patient is concerned that he may have carcinoid tumor he would like to have further evaluation.  We will set him up for some lab work and 24-hour urine  Patient also has intermittent neck pain he has had a previous MRI.  I believe it is more degenerative changes but if it persists next step is a repeat x-ray possible MRI  Patient still has intermittent ongoing chest pains which been worked up by numerous evaluations without findings specific cause more than likely musculoskeletal  Further work-up we will do a 24-hour urine 5 HIAA's, serum tryptase, plasma metanephrines, TSH, free T4, CMP, CBC

## 2019-02-15 NOTE — Addendum Note (Signed)
Addended by: Dairl Ponder on: 02/15/2019 03:39 PM   Modules accepted: Orders

## 2019-02-15 NOTE — Progress Notes (Signed)
Blood work papers mailed to patient

## 2019-03-08 ENCOUNTER — Other Ambulatory Visit: Payer: Self-pay | Admitting: Family Medicine

## 2019-03-08 LAB — COMPREHENSIVE METABOLIC PANEL
ALT: 49 IU/L — ABNORMAL HIGH (ref 0–44)
AST: 32 IU/L (ref 0–40)
Albumin/Globulin Ratio: 2 (ref 1.2–2.2)
Albumin: 5 g/dL (ref 4.0–5.0)
Alkaline Phosphatase: 83 IU/L (ref 39–117)
BUN/Creatinine Ratio: 16 (ref 9–20)
BUN: 13 mg/dL (ref 6–24)
Bilirubin Total: 0.3 mg/dL (ref 0.0–1.2)
CO2: 23 mmol/L (ref 20–29)
Calcium: 9.3 mg/dL (ref 8.7–10.2)
Chloride: 102 mmol/L (ref 96–106)
Creatinine, Ser: 0.83 mg/dL (ref 0.76–1.27)
GFR calc Af Amer: 122 mL/min/{1.73_m2} (ref >59)
GFR calc non Af Amer: 106 mL/min/{1.73_m2} (ref >59)
Globulin, Total: 2.5 g/dL (ref 1.5–4.5)
Glucose: 84 mg/dL (ref 65–99)
Potassium: 4.3 mmol/L (ref 3.5–5.2)
Sodium: 141 mmol/L (ref 134–144)
Total Protein: 7.5 g/dL (ref 6.0–8.5)

## 2019-03-08 LAB — CBC WITH DIFFERENTIAL/PLATELET
Basophils Absolute: 0 10*3/uL (ref 0.0–0.2)
Basos: 1 %
EOS (ABSOLUTE): 0.1 10*3/uL (ref 0.0–0.4)
Eos: 2 %
Hematocrit: 42.5 % (ref 37.5–51.0)
Hemoglobin: 14.6 g/dL (ref 13.0–17.7)
Immature Grans (Abs): 0 10*3/uL (ref 0.0–0.1)
Immature Granulocytes: 0 %
Lymphocytes Absolute: 1.1 10*3/uL (ref 0.7–3.1)
Lymphs: 28 %
MCH: 31.5 pg (ref 26.6–33.0)
MCHC: 34.4 g/dL (ref 31.5–35.7)
MCV: 92 fL (ref 79–97)
Monocytes Absolute: 0.6 10*3/uL (ref 0.1–0.9)
Monocytes: 14 %
Neutrophils Absolute: 2.2 10*3/uL (ref 1.4–7.0)
Neutrophils: 55 %
Platelets: 194 10*3/uL (ref 150–450)
RBC: 4.64 x10E6/uL (ref 4.14–5.80)
RDW: 13.2 % (ref 11.6–15.4)
WBC: 4 10*3/uL (ref 3.4–10.8)

## 2019-03-08 LAB — TSH: TSH: 1.44 u[IU]/mL (ref 0.450–4.500)

## 2019-03-08 LAB — T4, FREE: Free T4: 1.45 ng/dL (ref 0.82–1.77)

## 2019-03-16 ENCOUNTER — Other Ambulatory Visit: Payer: Self-pay | Admitting: *Deleted

## 2019-03-16 DIAGNOSIS — M542 Cervicalgia: Secondary | ICD-10-CM

## 2019-03-16 LAB — METANEPHRINES, PLASMA
Metanephrine, Free: 11 pg/mL (ref 0.0–88.0)
Normetanephrine, Free: 29.4 pg/mL (ref 0.0–125.8)

## 2019-03-16 LAB — TRYPTASE: Tryptase: 3.7 ug/L (ref 2.2–13.2)

## 2019-03-17 LAB — 5 HIAA, QUANTITATIVE, URINE, 24 HOUR
5-HIAA, Ur: 3.1 mg/L
5-HIAA,Quant.,24 Hr Urine: 4.6 mg/24 hr (ref 0.0–14.9)

## 2019-03-21 ENCOUNTER — Ambulatory Visit (HOSPITAL_COMMUNITY)
Admission: RE | Admit: 2019-03-21 | Discharge: 2019-03-21 | Disposition: A | Discharge status: Home / Self Care | Payer: No Typology Code available for payment source | Source: Ambulatory Visit | Attending: Family Medicine | Admitting: Family Medicine

## 2019-03-21 ENCOUNTER — Other Ambulatory Visit: Payer: Self-pay

## 2019-03-21 DIAGNOSIS — M542 Cervicalgia: Secondary | ICD-10-CM | POA: Insufficient documentation

## 2019-03-27 ENCOUNTER — Ambulatory Visit (INDEPENDENT_AMBULATORY_CARE_PROVIDER_SITE_OTHER): Payer: No Typology Code available for payment source | Admitting: Family Medicine

## 2019-03-27 ENCOUNTER — Other Ambulatory Visit: Payer: Self-pay

## 2019-03-27 ENCOUNTER — Encounter: Payer: Self-pay | Admitting: Family Medicine

## 2019-03-27 VITALS — BP 142/94 | Temp 97.7°F | Ht 72.0 in | Wt 271.0 lb

## 2019-03-27 DIAGNOSIS — K2100 Gastro-esophageal reflux disease with esophagitis, without bleeding: Secondary | ICD-10-CM

## 2019-03-27 DIAGNOSIS — M542 Cervicalgia: Secondary | ICD-10-CM

## 2019-03-27 DIAGNOSIS — R079 Chest pain, unspecified: Secondary | ICD-10-CM

## 2019-03-27 DIAGNOSIS — R002 Palpitations: Secondary | ICD-10-CM | POA: Diagnosis not present

## 2019-03-27 MED ORDER — TADALAFIL 5 MG PO TABS: 5.0000 mg | ORAL_TABLET | Freq: Every day | ORAL | 5 refills | Status: DC

## 2019-03-27 MED ORDER — MELOXICAM 15 MG PO TABS: 15.0000 mg | ORAL_TABLET | Freq: Every day | ORAL | 0 refills | Status: DC

## 2019-03-27 MED ORDER — PANTOPRAZOLE SODIUM 40 MG PO TBEC: 40.0000 mg | DELAYED_RELEASE_TABLET | Freq: Every day | ORAL | 3 refills | Status: DC

## 2019-03-27 MED ORDER — SUCRALFATE 1 G PO TABS: 1.0000 g | ORAL_TABLET | Freq: Three times a day (TID) | ORAL | 0 refills | Status: DC

## 2019-03-27 NOTE — Progress Notes (Signed)
Subjective:    Patient ID: Jeffrey [name], male    DOB: 1970-05-24, 49 y.o.   MRN: [id number] Very nice gentleman here today for office visit Neck Pain  This is a new problem. Episode onset: few months. gradually getting worse. Associated symptoms include chest pain. Pertinent negatives include no headaches. Associated symptoms comments: Pain in shoulders. He has tried nothing for the symptoms.   Patient states the pain is in the back portion of his neck he states it feels stiff and sore on a regular basis he wonders if he may have some underlying arthritis issues.  Had recent x-rays which did show degenerative condition does not radiate down the arms.  Denies any numbness or tingling into his hands.  Denies any headache.  Also having some nonspecific chest pain on the left side and sometimes midsternum does not occur with activity comes and goes sometimes it happens when he is eating and he states if he belches it does help it he denies any chest pressure with activity denies any severe shortness of breath  Patient does relate a fair amount of reflux related issues burning in the esophagus denies dysphagia he does state he drinks several beers per night and sometimes has difficulty sleeping.  We did talk about measures to try to help this patient denies being anxious or depressed  Patient also relates occasional dizziness when he rolls over he feels unsteady sometimes when he walks he feels slightly unsteady but no true vertigo  Also states occasional palpitations but these come and go very quickly and only occur infrequently no chest tightness pressure pain or shortness of breath with it Review of Systems  Constitutional: Negative for diaphoresis and fatigue.  HENT: Negative for congestion and rhinorrhea.   Respiratory: Negative for cough and shortness of breath.   Cardiovascular: Positive for chest pain and palpitations. Negative for leg swelling.  Gastrointestinal: Negative for  abdominal pain and diarrhea.  Musculoskeletal: Positive for arthralgias and neck pain. Negative for back pain.  Skin: Negative for color change and rash.  Neurological: Positive for dizziness. Negative for headaches.  Psychiatric/Behavioral: Negative for behavioral problems and confusion.       Objective:   Physical Exam Neck has discomfort in the posterior aspect of the neck more on the right side than the left side has good range of motion.  Reflexes on elbows and hands are good strength in hands good lungs are clear respiratory rate is normal no crackles heart is regular no murmurs abdomen soft no tenderness   On EKG I do not find any type of abnormal areas there is some wavering in the baseline    Assessment & Plan:  1. Arthralgia of cervical spine Significant arthralgia we will go ahead with some meloxicam on a daily basis for the next 3 weeks if this does not help patient will let us know also will check lab work to make sure there is an underlying issue going on - Lipid panel - C-reactive protein - HLA-B27 antigen - PR ELECTROCARDIOGRAM, COMPLETE  2. Chest pain, unspecified type His chest pain I doubt is cardiac I think it is more likely reflux related EKG looks good no acute changes on today's EKG looks similar to previous EKG I do not feel the patient needs to have stress test currently.  I do not feel patient needs to see cardiology at this point but if his discomforts continue we may have to send him back for further evaluation - Lipid panel -  C-reactive protein - HLA-B27 antigen - PR ELECTROCARDIOGRAM, COMPLETE  3. Gastroesophageal reflux disease with esophagitis without hemorrhage We will go ahead and change his PPI to Protonix and also add Carafate 4 times daily over the next month patient was encouraged to cut back on beer he is to cut it down to 2 or less per day avoid other anti-inflammatories other than what was prescribed above if dysphagia progressive troubles or  other problems notify us immediately - Lipid panel - C-reactive protein - HLA-B27 antigen - PR ELECTROCARDIOGRAM, COMPLETE  Gets occasional dizzy feeling feeling off balance when he turns over in bed or sits up quickly more than likely disequilibrium related to the inner ear not a sign of any type of stroke  Finally we did touch base how with his symptomatology it is possible that some of this could be even stress related and consideration for low-dose serotonin reuptake inhibitor may be helpful

## 2019-03-30 ENCOUNTER — Encounter: Payer: Self-pay | Admitting: Family Medicine

## 2019-04-01 LAB — LIPID PANEL
Chol/HDL Ratio: 7.1 ratio — ABNORMAL HIGH (ref 0.0–5.0)
Cholesterol, Total: 275 mg/dL — ABNORMAL HIGH (ref 100–199)
HDL: 39 mg/dL — ABNORMAL LOW (ref >39)
LDL Chol Calc (NIH): 157 mg/dL — ABNORMAL HIGH (ref 0–99)
Triglycerides: 414 mg/dL — ABNORMAL HIGH (ref 0–149)
VLDL Cholesterol Cal: 79 mg/dL — ABNORMAL HIGH (ref 5–40)

## 2019-04-01 LAB — C-REACTIVE PROTEIN: CRP: 3 mg/L (ref 0–10)

## 2019-04-01 LAB — HLA-B27 ANTIGEN: HLA B27: NEGATIVE

## 2019-04-04 ENCOUNTER — Other Ambulatory Visit: Payer: Self-pay | Admitting: Family Medicine

## 2019-04-04 DIAGNOSIS — Z13220 Encounter for screening for lipoid disorders: Secondary | ICD-10-CM

## 2019-07-27 ENCOUNTER — Other Ambulatory Visit: Payer: Self-pay | Admitting: Family Medicine

## 2019-08-11 ENCOUNTER — Telehealth: Payer: Self-pay | Admitting: Internal Medicine

## 2019-08-11 NOTE — Telephone Encounter (Signed)
He needs an office visit.  

## 2019-08-11 NOTE — Telephone Encounter (Signed)
Patient overdue for tcs-does he need an ov or triage?

## 2019-08-14 ENCOUNTER — Encounter: Payer: Self-pay | Admitting: Internal Medicine

## 2019-08-14 NOTE — Telephone Encounter (Signed)
PATIENT SCHEDULED  °

## 2019-10-07 ENCOUNTER — Encounter: Payer: Self-pay | Admitting: Gastroenterology

## 2019-10-07 NOTE — Progress Notes (Signed)
Primary Care Physician:  Kathyrn Drown, MD Primary Gastroenterologist:  Dr. Oneida Alar historically (pending Dr. Abbey Chatters)  Chief Complaint  Patient presents with   Consult    TCS last done 2015   Gas   Chest Pain    after eating at times, few times per week    HPI:   Jeffrey [name] is a 49 y.o. male presenting today to discuss scheduling surveillance colonoscopy. 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  Last colonoscopy in July 2015 with normal TI, 1 colon polyp removed but not retrieved, mild sigmoid diverticulosis with associated luminal narrowing, moderate sized external hemorrhoids.  Recommended repeat colonoscopy in 5 years.  Recommended all first-degree relatives to have colonoscopy at age 31 and every 5 years.  EGD July 2015 with normal-appearing esophagus s/p empiric dilation, mild erosive gastritis s/p biopsy, small stenosis transversable after dilation at the pylorus, normal examined duodenum.  Gastric biopsies with reactive/regenerative changes and focal intestinal metaplasia with no H. pylori, dysplasia, or malignancy.  Today: No lower GI concerns.  BMs daily.  No constipation or diarrhea.  Denies BRBPR or melena.  No unintentional weight loss.  Currently taking Nexium 20 mg daily for GERD symptoms.  Breakthrough reflux symptoms 1-2 times a week.  Associated chest discomfort and upper abdominal pain with breakthrough symptoms.  Notes he is able to burp at times to relieve the discomfort.  Limiting fried/fatty/greasy foods. Drinking soda daily. 18 pack of beer a week. No NSAIDs. Uses tylenol as needed. No dysphagia.   Past Medical History:  Diagnosis Date   COPD (chronic obstructive pulmonary disease) (HCC)    GERD (gastroesophageal reflux disease)     Past Surgical History:  Procedure Laterality Date   cardiac stress test     CHOLECYSTECTOMY     COLONOSCOPY N/A 09/19/2013   Procedure: COLONOSCOPY;  Surgeon: [name], MD;  Location: [facility];  Service: Endoscopy;  normal TI, 1 colon polyp removed but not retrieved, mild sigmoid diverticulosis with associated luminal narrowing, moderate sized external hemorrhoids.  Recommended repeat colonoscopy in 5 years.    COLONOSCOPY  2011   Dr. Oneida Alar; 1.2 cm rectal tubulovillous adenoma    ESOPHAGOGASTRODUODENOSCOPY N/A 09/19/2013   Procedure: ESOPHAGOGASTRODUODENOSCOPY (EGD);  Surgeon: [name], MD; normal-appearing esophagus s/p empiric dilation, mild erosive gastritis s/p biopsy, small stenosis transversable after dilation at the pylorus, normal examined duodenum.  Gastric biopsies with reactive/regenerative changes and focal intestinal metaplasia with no H. pylori, dysplasia, or malignancy.   ESOPHAGOGASTRODUODENOSCOPY ENDOSCOPY     MALONEY DILATION N/A 09/19/2013   Procedure: Venia Minks DILATION;  Surgeon: [name], MD;  Location: [facility];  Service: Endoscopy;  Laterality: N/A;   SAVORY DILATION N/A 09/19/2013   Procedure: SAVORY DILATION;  Surgeon: [name], MD;  Location: [facility];  Service: Endoscopy;  Laterality: N/A;    Current Outpatient Medications  Medication Sig Dispense Refill   acetaminophen (TYLENOL) 500 MG tablet Take 500 mg by mouth every 6 (six) hours as needed for mild pain, moderate pain or headache.     Ascorbic Acid (VITAMIN C) 1000 MG tablet Take 1,000 mg by mouth daily.      esomeprazole (NEXIUM) 20 MG capsule Take 20 mg by mouth daily at 12 noon.     tadalafil (CIALIS) 5 MG tablet Take 1 tablet (5 mg total) by mouth daily. 30 tablet 5   albuterol (VENTOLIN HFA) 108 (90 Base) MCG/ACT inhaler  Inhale 2 puffs into the lungs every 4 (four) hours as needed for wheezing or shortness of breath. (Patient not taking: Reported on 10/09/2019) 18 g 4   pantoprazole (PROTONIX) 40 MG tablet Take 1 tablet (40 mg total) by mouth daily. 30 tablet 5   No current facility-administered medications for this visit.     Allergies as of 10/09/2019   (No Known Allergies)    Family History  Problem Relation Age of Onset   Colon cancer Neg Hx    Colon polyps Neg Hx     Social History   Socioeconomic History   Marital status: Divorced    Spouse name: Not on file   Number of children: Not on file   Years of education: Not on file   Highest education level: Not on file  Occupational History   Not on file  Tobacco Use   Smoking status: Former Smoker   Smokeless tobacco: Current User  Substance and Sexual Activity   Alcohol use: Yes    Comment: 18 pack per week   Drug use: Yes    Types: Marijuana    Comment: occas   Sexual activity: Not on file  Other Topics Concern   Not on file  Social History Narrative   OWNS [place]. RACES CARS WITH NITRO. STEP DAUGHTER RACES AS WELL.   Social Determinants of Health   Financial Resource Strain:    Difficulty of Paying Living Expenses:   Food Insecurity:    Worried About Charity fundraiser in the Last Year:    Arboriculturist in the Last Year:   Transportation Needs:    Film/video editor (Medical):    Lack of Transportation (Non-Medical):   Physical Activity:    Days of Exercise per Week:    Minutes of Exercise per Session:   Stress:    Feeling of Stress :   Social Connections:    Frequency of Communication with Friends and Family:    Frequency of Social Gatherings with Friends and Family:    Attends Religious Services:    Active Member of Clubs or Organizations:    Attends Music therapist:    Marital Status:   Intimate Partner Violence:    Fear of Current or Ex-Partner:    Emotionally Abused:    Physically Abused:    Sexually Abused:     Review of Systems: Gen: Denies any fever, chills, cold or flulike symptoms, lightheadedness, dizziness, presyncope, syncope. CV: Denies chest pain or heart palpitations Resp: Occasional shortness of breath. Not regularly. No regular  cough.  GI: See HPI GU : Denies urinary burning, urinary frequency, urinary hesitancy Psych: Denies depression or anxiety. Heme: See HPI  Physical Exam: BP (!) 135/90    Pulse 78    Temp (!) 97.5 F (36.4 C)    Ht 6' (1.829 m)    Wt (!) 264 lb 3.2 oz (119.8 kg)    BMI 35.83 kg/m  General:   Alert and oriented. Pleasant and cooperative. Well-nourished and well-developed.  Head:  Normocephalic and atraumatic. Eyes:  Without icterus, sclera clear and conjunctiva pink.  Ears:  Normal auditory acuity. Lungs:  Clear to auscultation bilaterally. No wheezes, rales, or rhonchi. No distress.  Heart:  S1, S2 present without murmurs appreciated.  Abdomen:  +BS, soft, non-tender and non-distended. No HSM noted. No guarding or rebound. No masses appreciated.  Rectal:  Deferred  Msk:  Symmetrical without gross deformities. Normal posture. Extremities:  Without edema.  Neurologic:  Alert and  oriented x4;  grossly normal neurologically. Skin:  Intact without significant lesions or rashes. Psych: Normal mood and affect.

## 2019-10-09 ENCOUNTER — Other Ambulatory Visit: Payer: Self-pay

## 2019-10-09 ENCOUNTER — Ambulatory Visit (INDEPENDENT_AMBULATORY_CARE_PROVIDER_SITE_OTHER): Payer: No Typology Code available for payment source | Admitting: Gastroenterology

## 2019-10-09 ENCOUNTER — Encounter: Payer: Self-pay | Admitting: Gastroenterology

## 2019-10-09 VITALS — BP 135/90 | HR 78 | Temp 97.5°F | Ht 72.0 in | Wt 264.2 lb

## 2019-10-09 DIAGNOSIS — K219 Gastro-esophageal reflux disease without esophagitis: Secondary | ICD-10-CM

## 2019-10-09 DIAGNOSIS — R1013 Epigastric pain: Secondary | ICD-10-CM

## 2019-10-09 DIAGNOSIS — Z86010 Personal history of colonic polyps: Secondary | ICD-10-CM

## 2019-10-09 MED ORDER — PANTOPRAZOLE SODIUM 40 MG PO TBEC: 40.0000 mg | DELAYED_RELEASE_TABLET | Freq: Every day | ORAL | 5 refills | Status: DC

## 2019-10-09 NOTE — Assessment & Plan Note (Addendum)
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  Last colonoscopy in 2015 with normal TI, 1 colon polyp removed but not retrieved, mild sigmoid diverticulosis with associated luminal narrowing, moderate sized external hemorrhoids.  Recommend repeat colonoscopy in 5 years.  Denies any significant lower GI symptoms at this time.  No BRBPR or melena.  No unintentional weight loss.  Plan: Proceed with colonoscopy with propofol with Dr. Abbey Chatters in the near future. The risks, benefits, and alternatives have been discussed in detail with patient. They have stated understanding and desire to proceed.  ASA III Follow-up in 3 months.

## 2019-10-09 NOTE — Assessment & Plan Note (Addendum)
Chronic.  Not adequately controlled on Nexium 20 mg daily.  Suspect dietary habits as well as chronic alcohol abuse contributing to reflux symptoms.  Associated epigastric/chest discomfort with breakthrough symptoms. Denies dysphagia, BRBPR, melena, or regular NSAID use. EGD July 2015 with normal-appearing esophagus s/p empiric dilation, mild erosive gastritis s/p biopsy, small stenosis transversal after dilation of the pylorus, normal examined duodenum.  Gastric biopsies with reactive/regenerative changes and focal intestinal metaplasia with no H. pylori, dysplasia, or malignancy.  Plan: Stop Nexium and start Protonix 40 mg daily 30 minutes before breakfast. Counseled on GERD diet/lifestyle. Counseled extensively on the need to work towards abstinence from alcohol. Follow-up in 3 months.

## 2019-10-09 NOTE — Patient Instructions (Signed)
We will get you scheduled for colonoscopy in the near future with Dr. Abbey Chatters.  Stop Nexium and start Protonix 40 mg daily 30 minutes before breakfast.  I sent in a prescription to your pharmacy.  Follow a GERD diet:  Avoid fried, fatty, greasy, spicy, citrus foods. Avoid caffeine and carbonated beverages. Avoid chocolate. Try eating 4-6 small meals a day rather than 3 large meals. Do not eat within 3 hours of laying down. Prop head of bed up on wood or bricks to create a 6 inch incline.  Work towards abstinence of alcohol as I suspect this is contributing to your upper abdominal discomfort and reflux symptoms.  We will plan to see back in 3 months.  Do not hesitate to call with questions or concerns prior.  Aliene Altes, PA-C [facility]

## 2019-10-09 NOTE — Progress Notes (Signed)
Cc'ed to pcp °

## 2019-10-09 NOTE — Assessment & Plan Note (Signed)
Addressed under GERD 

## 2019-10-16 ENCOUNTER — Encounter: Payer: Self-pay | Admitting: *Deleted

## 2019-10-16 ENCOUNTER — Telehealth: Payer: Self-pay | Admitting: *Deleted

## 2019-10-16 NOTE — Telephone Encounter (Signed)
Pt scheduled for 9/14 at 1:30pm for TCS with propofol with Dr. Abbey Chatters. Patient aware will need pre-op/covid test appt. Instructions will be mailed with this appt.

## 2019-10-17 ENCOUNTER — Encounter: Payer: Self-pay | Admitting: *Deleted

## 2019-10-18 ENCOUNTER — Telehealth: Payer: Self-pay | Admitting: *Deleted

## 2019-10-18 NOTE — Telephone Encounter (Signed)
Called [facility] and spoke with Emerson. I was advised no PA is required for TCS. REF# [id number]

## 2019-11-20 ENCOUNTER — Other Ambulatory Visit (HOSPITAL_COMMUNITY): Payer: No Typology Code available for payment source

## 2019-11-20 ENCOUNTER — Telehealth: Payer: Self-pay | Admitting: Internal Medicine

## 2019-11-20 ENCOUNTER — Encounter (HOSPITAL_COMMUNITY)
Admission: RE | Admit: 2019-11-20 | Discharge: 2019-11-20 | Disposition: A | Discharge status: Home / Self Care | Payer: No Typology Code available for payment source | Source: Ambulatory Visit | Attending: Internal Medicine | Admitting: Internal Medicine

## 2019-11-20 NOTE — Telephone Encounter (Signed)
Pt is sick and wants to cancel his procedure with Dr Abbey Chatters for tomorrow and reschedule. [phone]

## 2019-11-20 NOTE — Telephone Encounter (Signed)
Called pt. He is sick and needs to r/s procedure scheduled for tomorrow. He is now on for 11/2 at 9:15am. Pt aware will mail new prep instructions with pre-op/covid test appt. Called endo and made aware

## 2020-01-05 NOTE — Patient Instructions (Signed)
PETROS AHART  01/05/2020     @PREFPERIOPPHARMACY @   Your procedure is scheduled on  01/09/2020.  Report to [facility] at  0700  A.M.  Call this number if you have problems the morning of surgery:  [phone]   Remember:  Follow the diet and prep instructions given to you by the office.                       Take these medicines the morning of surgery with A SIP OF WATER  Claritin, protonix.    Do not wear jewelry, make-up or nail polish.  Do not wear lotions, powders, or perfumes. Please wear deodorant and brush your teeth.  Do not shave 48 hours prior to surgery.  Men may shave face and neck.  Do not bring valuables to the hospital.  [facility] is not responsible for any belongings or valuables.  Contacts, dentures or bridgework may not be worn into surgery.  Leave your suitcase in the car.  After surgery it may be brought to your room.  For patients admitted to the hospital, discharge time will be determined by your treatment team.  Patients discharged the day of surgery will not be allowed to drive home.   Name and phone number of your driver:   family Special instructions:   DO NOT smoke the morning of your procedure.  Please read over the following fact sheets that you were given. Anesthesia Post-op Instructions and Care and Recovery After Surgery       Colonoscopy, Adult, Care After This sheet gives you information about how to care for yourself after your procedure. Your health care provider may also give you more specific instructions. If you have problems or questions, contact your health care provider. What can I expect after the procedure? After the procedure, it is common to have:  A small amount of blood in your stool for 24 hours after the procedure.  Some gas.  Mild cramping or bloating of your abdomen. Follow these instructions at home: Eating and drinking   Drink enough fluid to keep your urine pale yellow.  Follow instructions  from your health care provider about eating or drinking restrictions.  Resume your normal diet as instructed by your health care provider. Avoid heavy or fried foods that are hard to digest. Activity  Rest as told by your health care provider.  Avoid sitting for a long time without moving. Get up to take short walks every 1-2 hours. This is important to improve blood flow and breathing. Ask for help if you feel weak or unsteady.  Return to your normal activities as told by your health care provider. Ask your health care provider what activities are safe for you. Managing cramping and bloating   Try walking around when you have cramps or feel bloated.  Apply heat to your abdomen as told by your health care provider. Use the heat source that your health care provider recommends, such as a moist heat pack or a heating pad. ? Place a towel between your skin and the heat source. ? Leave the heat on for 20-30 minutes. ? Remove the heat if your skin turns bright red. This is especially important if you are unable to feel pain, heat, or cold. You may have a greater risk of getting burned. General instructions  For the first 24 hours after the procedure: ? Do not drive or use machinery. ? Do not sign  important documents. ? Do not drink alcohol. ? Do your regular daily activities at a slower pace than normal. ? Eat soft foods that are easy to digest.  Take over-the-counter and prescription medicines only as told by your health care provider.  Keep all follow-up visits as told by your health care provider. This is important. Contact a health care provider if:  You have blood in your stool 2-3 days after the procedure. Get help right away if you have:  More than a small spotting of blood in your stool.  Large blood clots in your stool.  Swelling of your abdomen.  Nausea or vomiting.  A fever.  Increasing pain in your abdomen that is not relieved with medicine. Summary  After the  procedure, it is common to have a small amount of blood in your stool. You may also have mild cramping and bloating of your abdomen.  For the first 24 hours after the procedure, do not drive or use machinery, sign important documents, or drink alcohol.  Get help right away if you have a lot of blood in your stool, nausea or vomiting, a fever, or increased pain in your abdomen. This information is not intended to replace advice given to you by your health care provider. Make sure you discuss any questions you have with your health care provider. Document Revised: 09/19/2018 Document Reviewed: 09/19/2018 Elsevier Patient Education  [place] After These instructions provide you with information about caring for yourself after your procedure. Your health care provider may also give you more specific instructions. Your treatment has been planned according to current medical practices, but problems sometimes occur. Call your health care provider if you have any problems or questions after your procedure. What can I expect after the procedure? After your procedure, you may:  Feel sleepy for several hours.  Feel clumsy and have poor balance for several hours.  Feel forgetful about what happened after the procedure.  Have poor judgment for several hours.  Feel nauseous or vomit.  Have a sore throat if you had a breathing tube during the procedure. Follow these instructions at home: For at least 24 hours after the procedure:      Have a responsible adult stay with you. It is important to have someone help care for you until you are awake and alert.  Rest as needed.  Do not: ? Participate in activities in which you could fall or become injured. ? Drive. ? Use heavy machinery. ? Drink alcohol. ? Take sleeping pills or medicines that cause drowsiness. ? Make important decisions or sign legal documents. ? Take care of children on your own. Eating  and drinking  Follow the diet that is recommended by your health care provider.  If you vomit, drink water, juice, or soup when you can drink without vomiting.  Make sure you have little or no nausea before eating solid foods. General instructions  Take over-the-counter and prescription medicines only as told by your health care provider.  If you have sleep apnea, surgery and certain medicines can increase your risk for breathing problems. Follow instructions from your health care provider about wearing your sleep device: ? Anytime you are sleeping, including during daytime naps. ? While taking prescription pain medicines, sleeping medicines, or medicines that make you drowsy.  If you smoke, do not smoke without supervision.  Keep all follow-up visits as told by your health care provider. This is important. Contact a health care provider if:  You keep feeling nauseous or you keep vomiting.  You feel light-headed.  You develop a rash.  You have a fever. Get help right away if:  You have trouble breathing. Summary  For several hours after your procedure, you may feel sleepy and have poor judgment.  Have a responsible adult stay with you for at least 24 hours or until you are awake and alert. This information is not intended to replace advice given to you by your health care provider. Make sure you discuss any questions you have with your health care provider. Document Revised: 05/24/2017 Document Reviewed: 06/16/2015 Elsevier Patient Education  [place].

## 2020-01-08 ENCOUNTER — Other Ambulatory Visit: Payer: Self-pay

## 2020-01-08 ENCOUNTER — Encounter (HOSPITAL_COMMUNITY): Payer: Self-pay

## 2020-01-08 ENCOUNTER — Other Ambulatory Visit (HOSPITAL_COMMUNITY)
Admission: RE | Admit: 2020-01-08 | Discharge: 2020-01-08 | Disposition: A | Discharge status: Home / Self Care | Payer: BC Managed Care – PPO | Source: Ambulatory Visit | Attending: Internal Medicine | Admitting: Internal Medicine

## 2020-01-08 ENCOUNTER — Encounter (HOSPITAL_COMMUNITY)
Admission: RE | Admit: 2020-01-08 | Discharge: 2020-01-08 | Disposition: A | Discharge status: Home / Self Care | Payer: BC Managed Care – PPO | Source: Ambulatory Visit | Attending: Internal Medicine | Admitting: Internal Medicine

## 2020-01-08 DIAGNOSIS — Z01818 Encounter for other preprocedural examination: Secondary | ICD-10-CM | POA: Diagnosis not present

## 2020-01-08 DIAGNOSIS — Z20822 Contact with and (suspected) exposure to covid-19: Secondary | ICD-10-CM | POA: Insufficient documentation

## 2020-01-08 LAB — SARS CORONAVIRUS 2 (TAT 6-24 HRS): SARS Coronavirus 2: NEGATIVE

## 2020-01-08 NOTE — Progress Notes (Deleted)
Referring Provider: Kathyrn Drown, MD Primary Care Physician:  Kathyrn Drown, MD Primary GI Physician: Dr. Abbey Chatters  No chief complaint on file.   HPI:   Jeffrey [name] is a 49 y.o. 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  EGD July 2015 with normal-appearing esophagus s/p empiric dilation, moderate gastritis s/p biopsy, small stenosis transversal after dilation of the pylorus, normal examined duodenum.  Gastric biopsy with reactive/regenerative changes and focal intestinal metaplasia with no H. pylori, dysplasia, or malignancy.  He resents today for follow-up of GERD and dyspepsia.  Recently underwent colonoscopy for surveillance 01/09/2020.***  Last seen in our office 10/09/2019.  He was taking Nexium 20 mg daily with breakthrough reflux symptoms 1-3 times a week with associated chest discomfort and upper abdominal pain.  He was drinking soda daily and an 18 pack of beer a week.  No NSAIDs.  Plan to stop Nexium and start Protonix 40 mg daily, counseled on GERD diet/lifestyle and the need to work towards abstinence from alcohol.  Today:     Past Medical History:  Diagnosis Date  . COPD (chronic obstructive pulmonary disease) ([place])   . GERD (gastroesophageal reflux disease)     Past Surgical History:  Procedure Laterality Date  . cardiac stress test    . CHOLECYSTECTOMY    . COLONOSCOPY N/A 09/19/2013   Procedure: COLONOSCOPY;  Surgeon: [name], MD;  [facility];  Service: Endoscopy;  normal TI, 1 colon polyp removed but not retrieved, mild sigmoid diverticulosis with associated luminal narrowing, moderate sized external hemorrhoids.  Recommended repeat colonoscopy in 5 years.   . COLONOSCOPY  2011   Dr. 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 ESOPHAGOGASTRODUODENOSCOPY N/A 09/19/2013   Procedure: ESOPHAGOGASTRODUODENOSCOPY (EGD);  Surgeon: [name], MD; normal-appearing esophagus s/p  empiric dilation, mild erosive gastritis s/p biopsy, small stenosis transversable after dilation at the pylorus, normal examined duodenum.  Gastric biopsies with reactive/regenerative changes and focal intestinal metaplasia with no H. pylori, dysplasia, or malignancy.  . ESOPHAGOGASTRODUODENOSCOPY ENDOSCOPY    . MALONEY DILATION N/A 09/19/2013   Procedure: MALONEY DILATION;  Surgeon: [name], MD;  [facility];  Service: Endoscopy;  Laterality: N/A;  . SAVORY DILATION N/A 09/19/2013   Procedure: SAVORY DILATION;  Surgeon: [name], MD;  [facility];  Service: Endoscopy;  Laterality: N/A;    Current Outpatient Medications  Medication Sig Dispense Refill  . acetaminophen (TYLENOL) 500 MG tablet Take 500-1,000 mg by mouth every 6 (six) hours as needed for mild pain, moderate pain or headache.     . albuterol (VENTOLIN HFA) 108 (90 Base) MCG/ACT inhaler Inhale 2 puffs into the lungs every 4 (four) hours as needed for wheezing or shortness of breath. 18 g 4  . Ascorbic Acid (VITAMIN C) 1000 MG tablet Take 1,000 mg by mouth daily.     Marland Kitchen loratadine (CLARITIN) 10 MG tablet Take 10 mg by mouth daily as needed for allergies.    . pantoprazole (PROTONIX) 40 MG tablet Take 1 tablet (40 mg total) by mouth daily. 30 tablet 5  . tadalafil (CIALIS) 5 MG tablet Take 1 tablet (5 mg total) by mouth daily. (Patient taking differently: Take 5 mg by mouth every evening. ) 30 tablet 5   No current facility-administered medications for this visit.    Allergies as of 01/10/2020  . (No Known Allergies)    Family  History  Problem Relation Age of Onset  . Colon cancer Neg Hx   . Colon polyps Neg Hx     Social History   Socioeconomic History  . Marital status: Divorced    Spouse name: Not on file  . Number of children: Not on file  . Years of education: Not on file  . Highest education level: Not on file  Occupational History  . Not on file  Tobacco Use  . Smoking  status: Former Research scientist (life sciences)  . Smokeless tobacco: Current User  Substance and Sexual Activity  . Alcohol use: Yes    Comment: 18 pack per week  . Drug use: Yes    Types: Marijuana    Comment: occas  . Sexual activity: Not on file  Other Topics Concern  . Not on file  Social History Narrative   OWNS [place]. RACES CARS WITH NITRO. STEP DAUGHTER RACES AS WELL.   Social Determinants of Health   Financial Resource Strain:   . Difficulty of Paying Living Expenses: Not on file  Food Insecurity:   . Worried About Charity fundraiser in the Last Year: Not on file  . Ran Out of Food in the Last Year: Not on file  Transportation Needs:   . Lack of Transportation (Medical): Not on file  . Lack of Transportation (Non-Medical): Not on file  Physical Activity:   . Days of Exercise per Week: Not on file  . Minutes of Exercise per Session: Not on file  Stress:   . Feeling of Stress : Not on file  Social Connections:   . Frequency of Communication with Friends and Family: Not on file  . Frequency of Social Gatherings with Friends and Family: Not on file  . Attends Religious Services: Not on file  . Active Member of Clubs or Organizations: Not on file  . Attends Archivist Meetings: Not on file  . Marital Status: Not on file    Review of Systems: Gen: Denies fever, chills, anorexia. Denies fatigue, weakness, weight loss.  CV: Denies chest pain, palpitations, syncope, peripheral edema, and claudication. Resp: Denies dyspnea at rest, cough, wheezing, coughing up blood, and pleurisy. GI: Denies vomiting blood, jaundice, and fecal incontinence.   Denies dysphagia or odynophagia. Derm: Denies rash, itching, dry skin Psych: Denies depression, anxiety, memory loss, confusion. No homicidal or suicidal ideation.  Heme: Denies bruising, bleeding, and enlarged lymph nodes.  Physical Exam: There were no vitals taken for this visit. General:   Alert and oriented. No distress noted.  Pleasant and cooperative.  Head:  Normocephalic and atraumatic. Eyes:  Conjuctiva clear without scleral icterus. Mouth:  Oral mucosa pink and moist. Good dentition. No lesions. Heart:  S1, S2 present without murmurs appreciated. Lungs:  Clear to auscultation bilaterally. No wheezes, rales, or rhonchi. No distress.  Abdomen:  +BS, soft, non-tender and non-distended. No rebound or guarding. No HSM or masses noted. Msk:  Symmetrical without gross deformities. Normal posture. Extremities:  Without edema. Neurologic:  Alert and  oriented x4 Psych:  Alert and cooperative. Normal mood and affect.

## 2020-01-09 ENCOUNTER — Ambulatory Visit (HOSPITAL_COMMUNITY)
Admission: RE | Admit: 2020-01-09 | Discharge: 2020-01-09 | Disposition: A | Discharge status: Home / Self Care | Payer: BC Managed Care – PPO | Attending: Internal Medicine | Admitting: Internal Medicine

## 2020-01-09 ENCOUNTER — Ambulatory Visit (HOSPITAL_COMMUNITY): Payer: BC Managed Care – PPO | Admitting: Certified Registered"

## 2020-01-09 ENCOUNTER — Encounter (HOSPITAL_COMMUNITY): Payer: Self-pay

## 2020-01-09 ENCOUNTER — Encounter (HOSPITAL_COMMUNITY)
Admission: RE | Disposition: A | Discharge status: Home / Self Care | Payer: Self-pay | Source: Home / Self Care | Attending: Internal Medicine

## 2020-01-09 ENCOUNTER — Other Ambulatory Visit: Payer: Self-pay

## 2020-01-09 DIAGNOSIS — D125 Benign neoplasm of sigmoid colon: Secondary | ICD-10-CM | POA: Insufficient documentation

## 2020-01-09 DIAGNOSIS — Z79899 Other long term (current) drug therapy: Secondary | ICD-10-CM | POA: Insufficient documentation

## 2020-01-09 DIAGNOSIS — D123 Benign neoplasm of transverse colon: Secondary | ICD-10-CM | POA: Insufficient documentation

## 2020-01-09 DIAGNOSIS — Z86010 Personal history of colonic polyps: Secondary | ICD-10-CM | POA: Diagnosis not present

## 2020-01-09 DIAGNOSIS — Z87891 Personal history of nicotine dependence: Secondary | ICD-10-CM | POA: Insufficient documentation

## 2020-01-09 DIAGNOSIS — Z09 Encounter for follow-up examination after completed treatment for conditions other than malignant neoplasm: Secondary | ICD-10-CM | POA: Diagnosis not present

## 2020-01-09 DIAGNOSIS — Z7951 Long term (current) use of inhaled steroids: Secondary | ICD-10-CM | POA: Insufficient documentation

## 2020-01-09 DIAGNOSIS — K219 Gastro-esophageal reflux disease without esophagitis: Secondary | ICD-10-CM | POA: Insufficient documentation

## 2020-01-09 DIAGNOSIS — J449 Chronic obstructive pulmonary disease, unspecified: Secondary | ICD-10-CM | POA: Diagnosis not present

## 2020-01-09 DIAGNOSIS — Z9049 Acquired absence of other specified parts of digestive tract: Secondary | ICD-10-CM | POA: Diagnosis not present

## 2020-01-09 DIAGNOSIS — Z1211 Encounter for screening for malignant neoplasm of colon: Secondary | ICD-10-CM | POA: Diagnosis not present

## 2020-01-09 DIAGNOSIS — K635 Polyp of colon: Secondary | ICD-10-CM

## 2020-01-09 DIAGNOSIS — K648 Other hemorrhoids: Secondary | ICD-10-CM | POA: Insufficient documentation

## 2020-01-09 HISTORY — PX: POLYPECTOMY: SHX5525

## 2020-01-09 HISTORY — PX: COLONOSCOPY WITH PROPOFOL: SHX5780

## 2020-01-09 SURGERY — COLONOSCOPY WITH PROPOFOL
Anesthesia: General

## 2020-01-09 MED ORDER — CHLORHEXIDINE GLUCONATE CLOTH 2 % EX PADS: 6.0000 | MEDICATED_PAD | Freq: Once | CUTANEOUS | Status: DC

## 2020-01-09 MED ORDER — STERILE WATER FOR IRRIGATION IR SOLN
Status: DC | PRN
  Administered 2020-01-09: 1.5 mL

## 2020-01-09 MED ORDER — LACTATED RINGERS IV SOLN: INTRAVENOUS | Status: DC

## 2020-01-09 MED ORDER — PROPOFOL 10 MG/ML IV BOLUS
INTRAVENOUS | Status: DC | PRN
  Administered 2020-01-09: 100 mg via INTRAVENOUS
  Administered 2020-01-09: 175 ug/kg/min via INTRAVENOUS

## 2020-01-09 NOTE — Anesthesia Preprocedure Evaluation (Signed)
Anesthesia Evaluation  Patient identified by MRN, date of birth, ID band Patient awake    Reviewed: Allergy & Precautions, H&P , NPO status , Patient's Chart, lab work & pertinent test results, reviewed documented beta blocker date and time   Airway Mallampati: II  TM Distance: >3 FB Neck ROM: full    Dental no notable dental hx.    Pulmonary neg pulmonary ROS, former smoker,    Pulmonary exam normal breath sounds clear to auscultation       Cardiovascular Exercise Tolerance: Good negative cardio ROS   Rhythm:regular Rate:Normal     Neuro/Psych PSYCHIATRIC DISORDERS negative neurological ROS     GI/Hepatic Neg liver ROS, GERD  Medicated,  Endo/Other  negative endocrine ROS  Renal/GU negative Renal ROS  negative genitourinary   Musculoskeletal   Abdominal   Peds  Hematology negative hematology ROS (+)   Anesthesia Other Findings   Reproductive/Obstetrics negative OB ROS                             Anesthesia Physical Anesthesia Plan  ASA: II  Anesthesia Plan: General   Post-op Pain Management:    Induction:   PONV Risk Score and Plan: Propofol infusion  Airway Management Planned:   Additional Equipment:   Intra-op Plan:   Post-operative Plan:   Informed Consent: I have reviewed the patients History and Physical, chart, labs and discussed the procedure including the risks, benefits and alternatives for the proposed anesthesia with the patient or authorized representative who has indicated his/her understanding and acceptance.     Dental Advisory Given  Plan Discussed with: CRNA  Anesthesia Plan Comments:         Anesthesia Quick Evaluation

## 2020-01-09 NOTE — Transfer of Care (Signed)
Immediate Anesthesia Transfer of Care Note  Patient: Jeffrey [name]  Procedure(s) Performed: COLONOSCOPY WITH PROPOFOL (N/A ) POLYPECTOMY  Patient Location: PACU  Anesthesia Type:General  Level of Consciousness: awake, alert , oriented and patient cooperative  Airway & Oxygen Therapy: Patient Spontanous Breathing  Post-op Assessment: Report given to RN, Post -op Vital signs reviewed and stable and Patient moving all extremities  Post vital signs: Reviewed and stable  Last Vitals:  Vitals Value Taken Time  BP    Temp    Pulse    Resp    SpO2      Last Pain:  Vitals:   01/09/20 0824  TempSrc:   PainSc: 0-No pain      Patients Stated Pain Goal: 8 (56/38/75 6433)  Complications: No complications documented.

## 2020-01-09 NOTE — H&P (Signed)
Primary Care Physician:  Kathyrn Drown, MD Primary Gastroenterologist:  Dr. Abbey Chatters  Pre-Procedure History & Physical: HPI:  Jeffrey [name] is a 49 y.o. male is here for a colonoscopy for surveillance due to 1.5cm tubulovillous adenoma on previous colonoscopy 2011. Patient denies any family history of colorectal cancer.  No melena or hematochezia.  No abdominal pain or unintentional weight loss.  No change in bowel habits.  Overall feels well from a GI standpoint.  Past Medical History:  Diagnosis Date  . COPD (chronic obstructive pulmonary disease) ([place])   . GERD (gastroesophageal reflux disease)     Past Surgical History:  Procedure Laterality Date  . cardiac stress test    . CHOLECYSTECTOMY    . COLONOSCOPY N/A 09/19/2013   Procedure: COLONOSCOPY;  Surgeon: [name], MD;  [facility];  Service: Endoscopy;  normal TI, 1 colon polyp removed but not retrieved, mild sigmoid diverticulosis with associated luminal narrowing, moderate sized external hemorrhoids.  Recommended repeat colonoscopy in 5 years.   . COLONOSCOPY  2011   Dr. 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 ESOPHAGOGASTRODUODENOSCOPY N/A 09/19/2013   Procedure: ESOPHAGOGASTRODUODENOSCOPY (EGD);  Surgeon: [name], MD; normal-appearing esophagus s/p empiric dilation, mild erosive gastritis s/p biopsy, small stenosis transversable after dilation at the pylorus, normal examined duodenum.  Gastric biopsies with reactive/regenerative changes and focal intestinal metaplasia with no H. pylori, dysplasia, or malignancy.  . ESOPHAGOGASTRODUODENOSCOPY ENDOSCOPY    . MALONEY DILATION N/A 09/19/2013   Procedure: MALONEY DILATION;  Surgeon: [name], MD;  [facility];  Service: Endoscopy;  Laterality: N/A;  . SAVORY DILATION N/A 09/19/2013   Procedure: SAVORY DILATION;  Surgeon: [name], MD;  [facility];  Service: Endoscopy;  Laterality: N/A;    Prior to Admission  medications   Medication Sig Start Date End Date Taking? Authorizing Provider  acetaminophen (TYLENOL) 500 MG tablet Take 500-1,000 mg by mouth every 6 (six) hours as needed for mild pain, moderate pain or headache.    Yes [provider]  albuterol (VENTOLIN HFA) 108 (90 Base) MCG/ACT inhaler Inhale 2 puffs into the lungs every 4 (four) hours as needed for wheezing or shortness of breath. 02/14/19  Yes Kathyrn Drown, MD  Ascorbic Acid (VITAMIN C) 1000 MG tablet Take 1,000 mg by mouth daily.    Yes [provider]  loratadine (CLARITIN) 10 MG tablet Take 10 mg by mouth daily as needed for allergies.   Yes [provider]  pantoprazole (PROTONIX) 40 MG tablet Take 1 tablet (40 mg total) by mouth daily. 10/09/19  Yes Aliene Altes S, PA-C  tadalafil (CIALIS) 5 MG tablet Take 1 tablet (5 mg total) by mouth daily. Patient taking differently: Take 5 mg by mouth every evening.  03/27/19  Yes Kathyrn Drown, MD    Allergies as of 10/16/2019  . (No Known Allergies)    Family History  Problem Relation Age of Onset  . Colon cancer Neg Hx   . Colon polyps Neg Hx     Social History   Socioeconomic History  . Marital status: Divorced    Spouse name: Not on file  . Number of children: Not on file  . Years of education: Not on file  . Highest education level: Not on file  Occupational History  . Not on file  Tobacco Use  . Smoking status: Former Research scientist (life sciences)  . Smokeless tobacco: Current User  Vaping Use  .  Vaping Use: Never used  Substance and Sexual Activity  . Alcohol use: Yes    Comment: 18 pack per week  . Drug use: Yes    Types: Marijuana    Comment: occas  . Sexual activity: Not on file  Other Topics Concern  . Not on file  Social History Narrative   OWNS [place]. RACES CARS WITH NITRO. STEP DAUGHTER RACES AS WELL.   Social Determinants of Health   Financial Resource Strain:   . Difficulty of Paying Living Expenses: Not on file  Food  Insecurity:   . Worried About Charity fundraiser in the Last Year: Not on file  . Ran Out of Food in the Last Year: Not on file  Transportation Needs:   . Lack of Transportation (Medical): Not on file  . Lack of Transportation (Non-Medical): Not on file  Physical Activity:   . Days of Exercise per Week: Not on file  . Minutes of Exercise per Session: Not on file  Stress:   . Feeling of Stress : Not on file  Social Connections:   . Frequency of Communication with Friends and Family: Not on file  . Frequency of Social Gatherings with Friends and Family: Not on file  . Attends Religious Services: Not on file  . Active Member of Clubs or Organizations: Not on file  . Attends Archivist Meetings: Not on file  . Marital Status: Not on file  Intimate Partner Violence:   . Fear of Current or Ex-Partner: Not on file  . Emotionally Abused: Not on file  . Physically Abused: Not on file  . Sexually Abused: Not on file    Review of Systems: See HPI, otherwise negative ROS  Impression/Plan: Jeffrey [name] is here fora colonoscopy for surveillance due to 1.5cm tubulovillous adenoma on previous colonoscopy 2011  The risks of the procedure including infection, bleed, or perforation as well as benefits, limitations, alternatives and imponderables have been reviewed with the patient. Questions have been answered. All parties agreeable.

## 2020-01-09 NOTE — Discharge Instructions (Addendum)
Colonoscopy Discharge Instructions  Read the instructions outlined below and refer to this sheet in the next few weeks. These discharge instructions provide you with general information on caring for yourself after you leave the hospital. Your doctor may also give you specific instructions. While your treatment has been planned according to the most current medical practices available, unavoidable complications occasionally occur.   ACTIVITY  You may resume your regular activity, but move at a slower pace for the next 24 hours.   Take frequent rest periods for the next 24 hours.   Walking will help get rid of the air and reduce the bloated feeling in your belly (abdomen).   No driving for 24 hours (because of the medicine (anesthesia) used during the test).    Do not sign any important legal documents or operate any machinery for 24 hours (because of the anesthesia used during the test).  NUTRITION  Drink plenty of fluids.   You may resume your normal diet as instructed by your doctor.   Begin with a light meal and progress to your normal diet. Heavy or fried foods are harder to digest and may make you feel sick to your stomach (nauseated).   Avoid alcoholic beverages for 24 hours or as instructed.  MEDICATIONS  You may resume your normal medications unless your doctor tells you otherwise.  WHAT YOU CAN EXPECT TODAY  Some feelings of bloating in the abdomen.   Passage of more gas than usual.   Spotting of blood in your stool or on the toilet paper.  IF YOU HAD POLYPS REMOVED DURING THE COLONOSCOPY:  No aspirin products for 7 days or as instructed.   No alcohol for 7 days or as instructed.   Eat a soft diet for the next 24 hours.  FINDING OUT THE RESULTS OF YOUR TEST Not all test results are available during your visit. If your test results are not back during the visit, make an appointment with your caregiver to find out the results. Do not assume everything is normal if  you have not heard from your caregiver or the medical facility. It is important for you to follow up on all of your test results.  SEEK IMMEDIATE MEDICAL ATTENTION IF:  You have more than a spotting of blood in your stool.   Your belly is swollen (abdominal distention).   You are nauseated or vomiting.   You have a temperature over 101.   You have abdominal pain or discomfort that is severe or gets worse throughout the day.   Your colonoscopy revealed 4 polyp(s) which I removed successfully. Await pathology results, my office will contact you. I recommend repeating colonoscopy in 5 years for surveillance purposes. Follow up with GI as needed.    I hope you have a great rest of your week!  Elon Alas. Abbey Chatters, D.O. Gastroenterology and Hepatology [facility]    Colon Polyps  Polyps are tissue growths inside the body. Polyps can grow in many places, including the large intestine (colon). A polyp may be a round bump or a mushroom-shaped growth. You could have one polyp or several. Most colon polyps are noncancerous (benign). However, some colon polyps can become cancerous over time. Finding and removing the polyps early can help prevent this. What are the causes? The exact cause of colon polyps is not known. What increases the risk? You are more likely to develop this condition if you:  Have a family history of colon cancer or colon polyps.  Are older than 56 or older than 45 if you are African American.  Have inflammatory bowel disease, such as ulcerative colitis or Crohn's disease.  Have certain hereditary conditions, such as: ? Familial adenomatous polyposis. ? Lynch syndrome. ? Turcot syndrome. ? Peutz-Jeghers syndrome.  Are overweight.  Smoke cigarettes.  Do not get enough exercise.  Drink too much alcohol.  Eat a diet that is high in fat and red meat and low in fiber.  Had childhood cancer that was treated with abdominal  radiation. What are the signs or symptoms? Most polyps do not cause symptoms. If you have symptoms, they may include:  Blood coming from your rectum when having a bowel movement.  Blood in your stool. The stool may look dark red or black.  Abdominal pain.  A change in bowel habits, such as constipation or diarrhea. How is this diagnosed? This condition is diagnosed with a colonoscopy. This is a procedure in which a lighted, flexible scope is inserted into the anus and then passed into the colon to examine the area. Polyps are sometimes found when a colonoscopy is done as part of routine cancer screening tests. How is this treated? Treatment for this condition involves removing any polyps that are found. Most polyps can be removed during a colonoscopy. Those polyps will then be tested for cancer. Additional treatment may be needed depending on the results of testing. Follow these instructions at home: Lifestyle  Maintain a healthy weight, or lose weight if recommended by your health care provider.  Exercise every day or as told by your health care provider.  Do not use any products that contain nicotine or tobacco, such as cigarettes and e-cigarettes. If you need help quitting, ask your health care provider.  If you drink alcohol, limit how much you have: ? 0-1 drink a day for women. ? 0-2 drinks a day for men.  Be aware of how much alcohol is in your drink. In the [place] one drink equals one 12 oz bottle of beer (355 mL), one 5 oz glass of wine (148 mL), or one 1 oz shot of hard liquor (44 mL). Eating and drinking   Eat foods that are high in fiber, such as fruits, vegetables, and whole grains.  Eat foods that are high in calcium and vitamin D, such as milk, cheese, yogurt, eggs, liver, fish, and broccoli.  Limit foods that are high in fat, such as fried foods and desserts.  Limit the amount of red meat and processed meat you eat, such as hot dogs, sausage, bacon, and lunch  meats. General instructions  Keep all follow-up visits as told by your health care provider. This is important. ? This includes having regularly scheduled colonoscopies. ? Talk to your health care provider about when you need a colonoscopy. Contact a health care provider if:  You have new or worsening bleeding during a bowel movement.  You have new or increased blood in your stool.  You have a change in bowel habits.  You lose weight for no known reason. Summary  Polyps are tissue growths inside the body. Polyps can grow in many places, including the colon.  Most colon polyps are noncancerous (benign), but some can become cancerous over time.  This condition is diagnosed with a colonoscopy.  Treatment for this condition involves removing any polyps that are found. Most polyps can be removed during a colonoscopy. This information is not intended to replace advice given to you by your health care provider. Make sure  you discuss any questions you have with your health care provider. Document Revised: 06/10/2017 Document Reviewed: 06/10/2017 Elsevier Patient Education  [place].

## 2020-01-09 NOTE — Op Note (Signed)
[facility] Patient Name: Jeffrey [name] Procedure Date: 01/09/2020 8:21 AM MRN: [id number] Date of Birth: 07-26-1970 Attending MD: Elon Alas. Abbey Chatters DO CSN: [id number] Age: 49 Admit Type: Outpatient Procedure:                Colonoscopy Indications:              High risk colon cancer surveillance: Personal                            history of adenoma with villous component Providers:                Elon Alas. Abbey Chatters, DO, Tacy Learn,                            Technician Referring MD:              Medicines:                See the Anesthesia note for documentation of the                            administered medications Complications:            No immediate complications. Estimated Blood Loss:     Estimated blood loss was minimal. Procedure:                Pre-Anesthesia Assessment:                           - The anesthesia plan was to use monitored                            anesthesia care (MAC).                           After obtaining informed consent, the colonoscope                            was passed under direct vision. Throughout the                            procedure, the patient's blood pressure, pulse, and                            oxygen saturations were monitored continuously. The                            [id number]-HQ190L ([id number]) scope was introduced through                            the anus and advanced to the the terminal ileum,                            with identification of the appendiceal orifice and                            IC valve. The colonoscopy was performed  without                            difficulty. The patient tolerated the procedure                            well. The quality of the bowel preparation was                            evaluated using the BBPS [facility] Bowel Preparation                            Scale) with scores of: Right Colon = 2 (minor                            amount of residual staining, small  fragments of                            stool and/or opaque liquid, but mucosa seen well),                            Transverse Colon = 2 (minor amount of residual                            staining, small fragments of stool and/or opaque                            liquid, but mucosa seen well) and Left Colon = 2                            (minor amount of residual staining, small fragments                            of stool and/or opaque liquid, but mucosa seen                            well). The total BBPS score equals 6. The quality                            of the bowel preparation was fair. Scope In: 8:28:07 AM Scope Out: 8:44:16 AM Scope Withdrawal Time: 0 hours 13 minutes 42 seconds  Total Procedure Duration: 0 hours 16 minutes 9 seconds  Findings:      The perianal and digital rectal examinations were normal.      Non-bleeding internal hemorrhoids were found during endoscopy.      Two sessile polyps were found in the transverse colon. The polyps were 6       to 8 mm in size. These polyps were removed with a cold snare. Resection       and retrieval were complete.      Two sessile polyps were found in the sigmoid colon. The polyps were 3 to       5 mm in size. These polyps were removed with a cold snare. Resection and  retrieval were complete.      The terminal ileum appeared normal. Impression:               - Preparation of the colon was fair.                           - Non-bleeding internal hemorrhoids.                           - Two 6 to 8 mm polyps in the transverse colon,                            removed with a cold snare. Resected and retrieved.                           - Two 3 to 5 mm polyps in the sigmoid colon,                            removed with a cold snare. Resected and retrieved.                           - The examined portion of the ileum was normal. Moderate Sedation:      Per Anesthesia Care Recommendation:           - Patient has a contact  number available for                            emergencies. The signs and symptoms of potential                            delayed complications were discussed with the                            patient. Return to normal activities tomorrow.                            Written discharge instructions were provided to the                            patient.                           - Resume previous diet.                           - Continue present medications.                           - Await pathology results.                           - Repeat colonoscopy in 5 years for surveillance.                           - Return to GI clinic PRN. Procedure Code(s):        ---  Professional ---                           [phone], Colonoscopy, flexible; with removal of                            tumor(s), polyp(s), or other lesion(s) by snare                            technique Diagnosis Code(s):        --- Professional ---                           Z86.010, Personal history of colonic polyps                           K64.8, Other hemorrhoids                           K63.5, Polyp of colon CPT copyright 2019 American Medical Association. All rights reserved. The codes documented in this report are preliminary and upon coder review may  be revised to meet current compliance requirements. Elon Alas. Abbey Chatters, DO [place] Abbey Chatters, DO 01/09/2020 8:48:44 AM This report has been signed electronically. Number of Addenda: 0

## 2020-01-09 NOTE — Anesthesia Postprocedure Evaluation (Signed)
Anesthesia Post Note  Patient: Jeffrey [name]  Procedure(s) Performed: COLONOSCOPY WITH PROPOFOL (N/A ) POLYPECTOMY  Patient location during evaluation: PACU Anesthesia Type: General Level of consciousness: awake, oriented, awake and alert and patient cooperative Pain management: pain level controlled Vital Signs Assessment: post-procedure vital signs reviewed and stable Respiratory status: spontaneous breathing, respiratory function stable and nonlabored ventilation Cardiovascular status: blood pressure returned to baseline and stable Postop Assessment: no headache and no backache Anesthetic complications: no   No complications documented.   Last Vitals:  Vitals:   01/09/20 0720  BP: (!) 138/93  Pulse: 74  Resp: 15  Temp: 36.9 C  SpO2: 97%    Last Pain:  Vitals:   01/09/20 0824  TempSrc:   PainSc: 0-No pain                 Tacy Learn

## 2020-01-10 ENCOUNTER — Ambulatory Visit: Payer: No Typology Code available for payment source | Admitting: Gastroenterology

## 2020-01-10 LAB — SURGICAL PATHOLOGY

## 2020-01-15 ENCOUNTER — Encounter (HOSPITAL_COMMUNITY): Payer: Self-pay | Admitting: Internal Medicine

## 2020-01-19 ENCOUNTER — Other Ambulatory Visit: Payer: Self-pay | Admitting: Family Medicine

## 2020-02-21 ENCOUNTER — Encounter: Payer: Self-pay | Admitting: Family Medicine

## 2020-02-21 ENCOUNTER — Ambulatory Visit: Payer: BC Managed Care – PPO | Admitting: Family Medicine

## 2020-02-21 ENCOUNTER — Other Ambulatory Visit: Payer: Self-pay

## 2020-02-21 VITALS — BP 124/80 | HR 89 | Temp 98.3°F | Ht 72.0 in | Wt 269.0 lb

## 2020-02-21 DIAGNOSIS — Z13220 Encounter for screening for lipoid disorders: Secondary | ICD-10-CM

## 2020-02-21 DIAGNOSIS — R079 Chest pain, unspecified: Secondary | ICD-10-CM

## 2020-02-21 DIAGNOSIS — M546 Pain in thoracic spine: Secondary | ICD-10-CM

## 2020-02-21 NOTE — Progress Notes (Signed)
Subjective:    Patient ID: Jeffrey [name], male    DOB: 1970/08/08, 49 y.o.   MRN: [id number] Very nice patient He is having significant problems that is impacting his quality of life  HPIneck pain ongoing for awhile. Pain on right side. Tried tylenol and advil.  He relates the pain goes into the mid back also goes into trapezius.  Hurts all the time.  Worse with certain movements.  Finds himself very frustrated by all this.  Upper back pain and burning. Worse with movement.   Patient also relates having intermittent pains in his chest most of these are at rest not with any strenuous activity denies substernal pressure But he does relate that it occurs frequently sometimes when he is at rest sometimes with activity occurs in the middle of the day sometimes later in the evening and he is very concerned that there could be underlying heart related issue for this he denies shortness of breath denies sweats or chills.   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  HENT: Negative for congestion and rhinorrhea.   Respiratory: Negative for cough and shortness of breath.   Cardiovascular: Negative for chest pain and leg swelling.  Gastrointestinal: Negative for abdominal pain and diarrhea.  Skin: Negative for color change and rash.  Neurological: Negative for dizziness and headaches.  Psychiatric/Behavioral: Negative for behavioral problems and confusion.       Objective:   Physical Exam Vitals reviewed.  Constitutional:       General: He is not in acute distress.    Appearance: He is well-nourished.  HENT:     Head: Normocephalic and atraumatic.  Eyes:     General:        Right eye: No discharge.        Left eye: No discharge.  Neck:     Trachea: No tracheal deviation.  Cardiovascular:     Rate and Rhythm: Normal rate and regular rhythm.     Heart sounds: Normal heart sounds. No murmur heard.   Pulmonary:     Effort: Pulmonary effort is normal. No respiratory distress.     Breath sounds: Normal breath sounds.  Musculoskeletal:        General: No edema.  Lymphadenopathy:     Cervical: No cervical adenopathy.  Skin:    General: Skin is warm and dry.  Neurological:     Mental Status: He is alert.     Coordination: Coordination normal.  Psychiatric:        Mood and Affect: Mood and affect normal.        Behavior: Behavior normal.     EKG was taken does not show any acute ST segment changes      Assessment & Plan:  1. Chest pain, unspecified type EKG does not show any acute changes This patient would benefit from possibly having cardiology do a CTA of the arteries to thoroughly rule out heart disease. In my opinion until this is thoroughly ruled out this  will dramatically concern the patient regarding his frequent chest pains - PR ELECTROCARDIOGRAM, COMPLETE - DG Chest 2 View - Comprehensive metabolic panel  2. Thoracic back pain, unspecified back pain laterality, unspecified chronicity We will do MRI of thoracic spine because of his back pain and radiation into each side please see discussion above if for some reason MRI cannot get approved we will set him up with spinal specialist such as Dr. Rolena Infante - MR THORACIC SPINE WO CONTRAST  3. Screening for lipid disorders Screening cholesterol recommended has history of elevated lipid - Lipid panel

## 2020-02-26 ENCOUNTER — Telehealth: Payer: Self-pay | Admitting: Family Medicine

## 2020-02-26 NOTE — Telephone Encounter (Signed)
Gabapentin 100 mg capsule, 1 taken 3 times daily, #90 with 4 refills We discussed how to gradually raise the dose of this from 1 up to 3 so he should try to follow those directions thank you

## 2020-02-26 NOTE — Telephone Encounter (Signed)
Patient was seen on 12/15 and was told a prescription for gabapentin would be called into [facility] [place]. Please advise

## 2020-02-27 ENCOUNTER — Encounter: Payer: Self-pay | Admitting: Family Medicine

## 2020-02-27 ENCOUNTER — Other Ambulatory Visit: Payer: Self-pay | Admitting: *Deleted

## 2020-02-27 MED ORDER — GABAPENTIN 100 MG PO CAPS: 100.0000 mg | ORAL_CAPSULE | Freq: Three times a day (TID) | ORAL | 4 refills | Status: DC

## 2020-02-27 NOTE — Progress Notes (Signed)
02/27/20- left message to return call

## 2020-03-04 NOTE — Progress Notes (Signed)
03/04/20- left message to return call

## 2020-03-05 NOTE — Addendum Note (Signed)
Addended by: Marlowe Shores on: 03/05/2020 11:29 AM   Modules accepted: Orders

## 2020-03-05 NOTE — Progress Notes (Signed)
03/05/20- pt returned call and verbalized understanding. Referral to cardiology placed.

## 2020-03-09 ENCOUNTER — Telehealth: Payer: Self-pay | Admitting: Family Medicine

## 2020-03-09 DIAGNOSIS — M5414 Radiculopathy, thoracic region: Secondary | ICD-10-CM

## 2020-03-09 NOTE — Telephone Encounter (Signed)
Nurses Our referral specialist states that the patient's MRI will not be approved because he has not tried physical therapy for this condition first  The insurance company is having the patient do 6 weeks of physical therapy to see if this helps if it does not help then they will approve the MRI  If the patient does not want to do the physical therapy I can help get him in with a orthopedic back specialist who could potentially work toward getting the MRI approved  If he does not want to do that at the very least he will have to try the gabapentin over the next 6 weeks, do stretching exercises, then follow-up for an office visit here and if he has not improved with all of that then we can retry getting the MRI approved  Thank you

## 2020-03-11 NOTE — Telephone Encounter (Signed)
Referral put in.

## 2020-03-11 NOTE — Addendum Note (Signed)
Addended by: Metro Kung on: 03/11/2020 09:35 AM   Modules accepted: Orders

## 2020-03-11 NOTE — Telephone Encounter (Signed)
Pt states refer him to ortho because he has done pt in the past and it has not helped. Pt does not have a preference just whom ever dr Lorin Picket recommends for referral. Please advise and I will put in order for referral.

## 2020-03-11 NOTE — Telephone Encounter (Signed)
I recommend [facility] whoever works with thoracic spine impingement of thoracic nerves thank you

## 2020-03-15 ENCOUNTER — Telehealth (INDEPENDENT_AMBULATORY_CARE_PROVIDER_SITE_OTHER): Payer: BC Managed Care – PPO | Admitting: Family Medicine

## 2020-03-15 ENCOUNTER — Other Ambulatory Visit: Payer: Self-pay

## 2020-03-15 DIAGNOSIS — R42 Dizziness and giddiness: Secondary | ICD-10-CM

## 2020-03-15 DIAGNOSIS — Z13220 Encounter for screening for lipoid disorders: Secondary | ICD-10-CM

## 2020-03-15 DIAGNOSIS — K219 Gastro-esophageal reflux disease without esophagitis: Secondary | ICD-10-CM | POA: Diagnosis not present

## 2020-03-15 DIAGNOSIS — R0789 Other chest pain: Secondary | ICD-10-CM

## 2020-03-15 DIAGNOSIS — F1010 Alcohol abuse, uncomplicated: Secondary | ICD-10-CM

## 2020-03-15 MED ORDER — MECLIZINE HCL 25 MG PO TABS: 25.0000 mg | ORAL_TABLET | Freq: Three times a day (TID) | ORAL | 0 refills | Status: DC | PRN

## 2020-03-15 MED ORDER — PANTOPRAZOLE SODIUM 40 MG PO TBEC: 40.0000 mg | DELAYED_RELEASE_TABLET | Freq: Two times a day (BID) | ORAL | 0 refills | Status: DC

## 2020-03-15 MED ORDER — SUCRALFATE 1 G PO TABS: 1.0000 g | ORAL_TABLET | Freq: Three times a day (TID) | ORAL | 0 refills | Status: DC

## 2020-03-15 NOTE — Progress Notes (Signed)
 Patient ID: Jeffrey [name], male    DOB: 12/09/1970, 50 y.o.   MRN: [id number]   Virtual Visit via Telephone Note  I connected with Meshilem L Clyne on 03/15/20 at 10:40 AM EST by telephone and verified that I am speaking with the correct person using two identifiers.  Location: Patient: car Provider: office   I discussed the limitations, risks, security and privacy concerns of performing an evaluation and management service by telephone and the availability of in person appointments. I also discussed with the patient that there may be a patient responsible charge related to this service. The patient expressed understanding and agreed to proceed.    Chief Complaint  Patient presents with  . Nasal Congestion   Subjective:    HPI   Pt stating having some dizziness for past 2 days with movement.  Feeling a burning pain in chest and back, has been telling pcp last few visits.  The movement of arms or twisting of body helps resolve it.   Seen on 02/21/20- with pcp for chest pain.  Burning under sternum, changed meds and was on nexium and on new med, protonix 40mg from Gi in 8/21. Pt admits to not eating good diet.  Feeling air build up after eating, lot of air or burping, the last few months.   Was drinking a lot of alcohol in last few months due to "pain" and in last 3-4 wks, not had any alcohol intake. Caffeine- 2-3 cans diet pepsi daily.  No coffee. Tried tums and doesn't feel like it's helping.  02/21/20-saw pcp- and ekg was normal.  Got sweaty and "felt weird." a few days ago when working out. Pt called EMS and they came to house and did EKG and vitals were normal. bp 140/90.  Vertigo started when rolled over in bed felt dizziness.  And sat up with dizziness.  Then slightly resolved, then 2 days ago worked out then did some weights, did feel some dizziness. Pt had to lay down on floor due to the dizziness feeling and thought was going to pass out.   Pt stopped taking  gabapentin for past 3 days.  Not noticing much difference in dizziness.   Friend has vertigo and took some of his pills for vertigo, and took meclazine. Feeling about 50% better, but still feeling it occ. Neck feeling a tension in it.  Had colonoscopy-  2 polyps. This past year.  Not had an EGD recently.  Had egd in 2015.  At that time had mild erosive gastritis.  Does see GI for gerd. Per GI notes 8/21- pt drinks 18 pk beer per week. PA at GI office stating gerd not controlled likely related to chronic alcohol abuse and his dietary habits. Discussed his diet and abstinence from alcohol on that visit in 8/21.  Pt states congestion this morning but has it off and on for the past year. No fever.   Pt stating this symptoms going on for past 7-10yrs with gerd, chest/back pain.    Medical History Shareef has a past medical history of COPD (chronic obstructive pulmonary disease) (HCC) and GERD (gastroesophageal reflux disease).   Outpatient Encounter Medications as of 03/15/2020  Medication Sig  . acetaminophen (TYLENOL) 500 MG tablet Take 500-1,000 mg by mouth every 6 (six) hours as needed for mild pain, moderate pain or headache.   . albuterol (VENTOLIN HFA) 108 (90 Base) MCG/ACT inhaler Inhale 2 puffs into the lungs every 4 (four) hours as needed for wheezing or   shortness of breath.  . Ascorbic Acid (VITAMIN C) 1000 MG tablet Take 1,000 mg by mouth daily.   Marland Kitchen loratadine (CLARITIN) 10 MG tablet Take 10 mg by mouth daily as needed for allergies.  Marland Kitchen meclizine (ANTIVERT) 25 MG tablet Take 1 tablet (25 mg total) by mouth 3 (three) times daily as needed for dizziness.  . pantoprazole (PROTONIX) 40 MG tablet Take 1 tablet (40 mg total) by mouth 2 (two) times daily.  . sucralfate (CARAFATE) 1 g tablet Take 1 tablet (1 g total) by mouth 4 (four) times daily -  with meals and at bedtime.  . tadalafil (CIALIS) 5 MG tablet Take 1 tablet by mouth once daily  . [DISCONTINUED] pantoprazole (PROTONIX) 40 MG  tablet Take 1 tablet (40 mg total) by mouth daily.  Marland Kitchen gabapentin (NEURONTIN) 100 MG capsule Take 1 capsule (100 mg total) by mouth 3 (three) times daily.   No facility-administered encounter medications on file as of 03/15/2020.     Review of Systems  Constitutional: Negative for chills and fever.  HENT: Positive for congestion. Negative for rhinorrhea and sore throat.   Respiratory: Negative for cough, shortness of breath and wheezing.   Cardiovascular: Negative for chest pain and leg swelling.       +burning chest pain, chronic  Gastrointestinal: Negative for abdominal pain, diarrhea, nausea and vomiting.  Genitourinary: Negative for dysuria and frequency.  Musculoskeletal: Positive for back pain (burning back pain, chronic).  Skin: Negative for rash.  Neurological: Positive for dizziness. Negative for weakness and headaches.     Vitals There were no vitals taken for this visit.  Objective:   Physical Exam  No PE due to phone visit.  Assessment and Plan   1. Gastroesophageal reflux disease, unspecified whether esophagitis present - pantoprazole (PROTONIX) 40 MG tablet; Take 1 tablet (40 mg total) by mouth 2 (two) times daily.  Dispense: 60 tablet; Refill: 0 - sucralfate (CARAFATE) 1 g tablet; Take 1 tablet (1 g total) by mouth 4 (four) times daily -  with meals and at bedtime.  Dispense: 90 tablet; Refill: 0 - CBC - CMP14+EGFR - Lipase  2. Atypical chest pain - CBC - CMP14+EGFR - Lipid panel  3. Vertigo - meclizine (ANTIVERT) 25 MG tablet; Take 1 tablet (25 mg total) by mouth 3 (three) times daily as needed for dizziness.  Dispense: 30 tablet; Refill: 0  4. Lipid screening - Lipid panel  5. Alcohol abuse    Vertigo-improving. take meclizine prn.  If worsening may need to see ENT or PT for BPV therapy.  gerd- worsening, increase protonix from 85m daily to bid. Stop eating spicy, greasy, caffeine and alcohol intake.  Eat bland diet and add carafate 4x per day  with meals. Pt seen by GI in 8/21.  Advised to abstain from alcohol and make diet changes.  Advised at that time f/u 3 months with GI.  Pt didn't follow up.    Atypical chest pain- Pt had multiple work ups of gerd and cardiovascular over the past few years.  Pt stating it is about the same, just saw pcp on 02/21/20 and had EKG that was unremarkable.  Chest xray and thoracic xrays ordered, pt hasn't gotten them.  Advising if worsening chest pain, radiation to left arm and neck, sweating to go to ER.  Pt to go get chest xray that was ordered at last visit. Pt voiced understanding. Just had ems come out 1 wk ago and give normal ekg and vitals.  Ordered Labs- cbc, cmp, lipids, lipase. Pt to get labs in the next week prior to pcp appt.  F/u with pcp 2 wks recheck of chest pain, gerd, and vertigo.   Follow Up Instructions:    I discussed the assessment and treatment plan with the patient. The patient was provided an opportunity to ask questions and all were answered. The patient agreed with the plan and demonstrated an understanding of the instructions.   The patient was advised to call back or seek an in-person evaluation if the symptoms worsen or if the condition fails to improve as anticipated.  I provided 20 minutes of non-face-to-face time during this encounter.

## 2020-03-21 NOTE — Progress Notes (Signed)
CARDIOLOGY CONSULT NOTE       Patient ID: Jeffrey [name] MRN: [id number] DOB/AGE: Oct 11, 1970 50 y.o.  Referring Physician: Wolfgang Phoenix Primary Physician: Kathyrn Drown, MD Primary Cardiologist: New Reason for Consultation: Atypical chest pain  Active Problems:   * No active hospital problems. *   HPI:  50 y.o. referred by Dr Wolfgang Phoenix for chest pain. History of GERD and COPD former smoker still dipping and use mariajuana and ETOH.  He has had ongoing right sided neck/trapezius pain Worse with certain movements and radiates to mid back. SSCP at rest No associated dyspnea, palpitations, syncope or LE edema. His back pain has been going on for years CXR and thoracic spine films ordered but not done yet . Previous C spine films 03/21/19 not acute MRI done 2017 showed small disc protrusion at T56 ECG in primary office 02/26/21 no acute abnormalities reviewed Previous labs showed normal CRP and HLA-B27   Had episode a week ago at rest with diaphoresis , radiation to jaw, pre syncope EMS came to house And indicated ECG ok and patient deferred gong to ER   Not married but living with same person 19 years 5 kids total Works with fork lifts Likes to ride 4x4's   Has cut back ETOH Still dipping and smoking marijuana   ROS All other systems reviewed and negative except as noted above  Past Medical History:  Diagnosis Date  . COPD (chronic obstructive pulmonary disease) ([place])   . GERD (gastroesophageal reflux disease)     Family History  Problem Relation Age of Onset  . Colon cancer Neg Hx   . Colon polyps Neg Hx     Social History   Socioeconomic History  . Marital status: Divorced    Spouse name: Not on file  . Number of children: Not on file  . Years of education: Not on file  . Highest education level: Not on file  Occupational History  . Not on file  Tobacco Use  . Smoking status: Former Research scientist (life sciences)  . Smokeless tobacco: Current User  Vaping Use  . Vaping Use: Never used   Substance and Sexual Activity  . Alcohol use: Yes    Comment: 18 pack per week  . Drug use: Yes    Types: Marijuana    Comment: occas  . Sexual activity: Not on file  Other Topics Concern  . Not on file  Social History Narrative   OWNS [place]. RACES CARS WITH NITRO. STEP DAUGHTER RACES AS WELL.   Social Determinants of Health   Financial Resource Strain: Not on file  Food Insecurity: Not on file  Transportation Needs: Not on file  Physical Activity: Not on file  Stress: Not on file  Social Connections: Not on file  Intimate Partner Violence: Not on file    Past Surgical History:  Procedure Laterality Date  . cardiac stress test    . CHOLECYSTECTOMY    . COLONOSCOPY N/A 09/19/2013   Procedure: COLONOSCOPY;  Surgeon: [name], MD;  [facility];  Service: Endoscopy;  normal TI, 1 colon polyp removed but not retrieved, mild sigmoid diverticulosis with associated luminal narrowing, moderate sized external hemorrhoids.  Recommended repeat colonoscopy in 5 years.   . COLONOSCOPY  2011   Dr. 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 COLONOSCOPY WITH PROPOFOL N/A 01/09/2020   Procedure: COLONOSCOPY WITH PROPOFOL;  Surgeon: Eloise Harman, DO;  [facility];  Service: Endoscopy;  Laterality: N/A;  1:30pm  . ESOPHAGOGASTRODUODENOSCOPY N/A 09/19/2013   Procedure: ESOPHAGOGASTRODUODENOSCOPY (EGD);  Surgeon: [name], MD; normal-appearing esophagus s/p empiric dilation, mild erosive gastritis s/p biopsy, small stenosis transversable after dilation at the pylorus, normal examined duodenum.  Gastric biopsies with reactive/regenerative changes and focal intestinal metaplasia with no H. pylori, dysplasia, or malignancy.  . ESOPHAGOGASTRODUODENOSCOPY ENDOSCOPY    . MALONEY DILATION N/A 09/19/2013   Procedure: MALONEY DILATION;  Surgeon: [name], MD;  [facility];  Service: Endoscopy;  Laterality: N/A;  . POLYPECTOMY   01/09/2020   Procedure: POLYPECTOMY;  Surgeon: Eloise Harman, DO;  [facility];  Service: Endoscopy;;  . SAVORY DILATION N/A 09/19/2013   Procedure: SAVORY DILATION;  Surgeon: [name], MD;  [facility];  Service: Endoscopy;  Laterality: N/A;      Current Outpatient Medications:  .  acetaminophen (TYLENOL) 500 MG tablet, Take 500-1,000 mg by mouth every 6 (six) hours as needed for mild pain, moderate pain or headache. , Disp: , Rfl:  .  albuterol (VENTOLIN HFA) 108 (90 Base) MCG/ACT inhaler, Inhale 2 puffs into the lungs every 4 (four) hours as needed for wheezing or shortness of breath., Disp: 18 g, Rfl: 4 .  Ascorbic Acid (VITAMIN C) 1000 MG tablet, Take 1,000 mg by mouth daily. , Disp: , Rfl:  .  gabapentin (NEURONTIN) 100 MG capsule, Take 1 capsule (100 mg total) by mouth 3 (three) times daily., Disp: 90 capsule, Rfl: 4 .  loratadine (CLARITIN) 10 MG tablet, Take 10 mg by mouth daily as needed for allergies., Disp: , Rfl:  .  meclizine (ANTIVERT) 25 MG tablet, Take 1 tablet (25 mg total) by mouth 3 (three) times daily as needed for dizziness., Disp: 30 tablet, Rfl: 0 .  pantoprazole (PROTONIX) 40 MG tablet, Take 1 tablet (40 mg total) by mouth 2 (two) times daily., Disp: 60 tablet, Rfl: 0 .  sucralfate (CARAFATE) 1 g tablet, Take 1 tablet (1 g total) by mouth 4 (four) times daily -  with meals and at bedtime., Disp: 90 tablet, Rfl: 0 .  tadalafil (CIALIS) 5 MG tablet, Take 1 tablet by mouth once daily, Disp: 30 tablet, Rfl: 5    Physical Exam: Blood pressure 108/82, pulse 78, height 6' (1.829 m), weight 121.6 kg, SpO2 99 %.    Affect appropriate Healthy:  appears stated age 89: normal Neck supple with no adenopathy JVP normal no bruits no thyromegaly Lungs clear with no wheezing and good diaphragmatic motion Heart:  S1/S2 no murmur, no rub, gallop or click PMI normal Abdomen: benighn, BS positve, no tenderness, no AAA no bruit.  No HSM or  HJR Distal pulses intact with no bruits No edema Neuro non-focal Skin warm and dry No muscular weakness   Labs:   Lab Results  Component Value Date   WBC 4.0 03/07/2019   HGB 14.6 03/07/2019   HCT 42.5 03/07/2019   MCV 92 03/07/2019   PLT 194 03/07/2019   No results for input(s): NA, K, CL, CO2, BUN, CREATININE, CALCIUM, PROT, BILITOT, ALKPHOS, ALT, AST, GLUCOSE in the last 168 hours.  Invalid input(s): LABALBU Lab Results  Component Value Date   TROPONINI <0.30 01/19/2013    Lab Results  Component Value Date   CHOL 275 (H) 03/27/2019   Lab Results  Component Value Date   HDL 39 (L) 03/27/2019   Lab Results  Component Value Date   LDLCALC 157 (H) 03/27/2019   Lab Results  Component Value Date   TRIG 414 (H) 03/27/2019   Lab Results  Component Value Date   CHOLHDL 7.1 (H) 03/27/2019   No results found for: LDLDIRECT    Radiology: No results found.  EKG: See HPI   ASSESSMENT AND PLAN:   1. Chest Pain: very atypical with normal CXR and ECG Recurrent favor cardiac CTA BMET today  2. GERD: continue protonix and carafate discussed role of ETOH and smokeless tobacco in worsening  3. Pain:  Muscular / thoracic history of thoracic disc spine abnormality primary w/u ? MRI and referral to neuro surgery   Signed: Jenkins Rouge 03/29/2020, 9:53 AM

## 2020-03-29 ENCOUNTER — Encounter: Payer: Self-pay | Admitting: Cardiovascular Disease

## 2020-03-29 ENCOUNTER — Ambulatory Visit: Payer: BC Managed Care – PPO | Admitting: Cardiovascular Disease

## 2020-03-29 ENCOUNTER — Other Ambulatory Visit: Payer: Self-pay

## 2020-03-29 DIAGNOSIS — R079 Chest pain, unspecified: Secondary | ICD-10-CM

## 2020-03-29 LAB — BASIC METABOLIC PANEL
BUN/Creatinine Ratio: 12 (ref 9–20)
BUN: 13 mg/dL (ref 6–24)
CO2: 23 mmol/L (ref 20–29)
Calcium: 9.6 mg/dL (ref 8.7–10.2)
Chloride: 98 mmol/L (ref 96–106)
Creatinine, Ser: 1.05 mg/dL (ref 0.76–1.27)
GFR calc Af Amer: 97 mL/min/{1.73_m2} (ref >59)
GFR calc non Af Amer: 84 mL/min/{1.73_m2} (ref >59)
Glucose: 89 mg/dL (ref 65–99)
Potassium: 4.7 mmol/L (ref 3.5–5.2)
Sodium: 138 mmol/L (ref 134–144)

## 2020-03-29 MED ORDER — METOPROLOL TARTRATE 100 MG PO TABS: ORAL_TABLET | ORAL | 0 refills | Status: DC

## 2020-03-29 NOTE — Patient Instructions (Addendum)
Lab Work: TODAY! BMET  Your cardiac CT will be scheduled at one of the below locations:   [facility] [address] [place], [place] [address] [phone]  [facility] [address] [place],  [address] [phone]  If scheduled at [facility], please arrive at the [facility] main entrance of [facility] 30 minutes prior to test start time. Proceed to the [facility] Radiology Department (first floor) to check-in and test prep.  If scheduled at [facility], please arrive 15 mins early for check-in and test prep.  Please follow these instructions carefully (unless otherwise directed):  Hold all erectile dysfunction medications at least 3 days (72 hrs) prior to test.  On the Night Before the Test: . Be sure to Drink plenty of water. . Do not consume any caffeinated/decaffeinated beverages or chocolate 12 hours prior to your test. . Do not take any antihistamines 12 hours prior to your test.  On the Day of the Test: . Drink plenty of water. Do not drink any water within one hour of the test. . Do not eat any food 4 hours prior to the test. . You may take your regular medications prior to the test.  . Take metoprolol (Lopressor) 100 mg two hours prior to test.      After the Test: . Drink plenty of water. . After receiving IV contrast, you may experience a mild flushed feeling. This is normal. . On occasion, you may experience a mild rash up to 24 hours after the test. This is not dangerous. If this occurs, you can take Benadryl 25 mg and increase your fluid intake. . If you experience trouble breathing, this can be serious. If it is severe call 911 IMMEDIATELY. If it is mild, please call our office.  Once we have confirmed authorization from your insurance company, we will call you to set up a date and time for your test. Based on how quickly your insurance  processes prior authorizations requests, please allow up to 4 weeks to be contacted for scheduling your Cardiac CT appointment. Be advised that routine Cardiac CT appointments could be scheduled as many as 8 weeks after your provider has ordered it.  For non-scheduling related questions, please contact the cardiac imaging nurse navigator should you have any questions/concerns: Marchia Bond, Cardiac Imaging Nurse Navigator Burley Saver, Interim Cardiac Imaging Nurse [facility] Direct Office Dial: [phone]   For scheduling needs, including cancellations and rescheduling, please call [place], [phone].

## 2020-04-05 ENCOUNTER — Other Ambulatory Visit: Payer: Self-pay

## 2020-04-05 ENCOUNTER — Ambulatory Visit (INDEPENDENT_AMBULATORY_CARE_PROVIDER_SITE_OTHER): Payer: BC Managed Care – PPO | Admitting: Family Medicine

## 2020-04-05 ENCOUNTER — Encounter: Payer: Self-pay | Admitting: Family Medicine

## 2020-04-05 VITALS — BP 124/76 | Ht 72.0 in | Wt 266.2 lb

## 2020-04-05 DIAGNOSIS — K219 Gastro-esophageal reflux disease without esophagitis: Secondary | ICD-10-CM

## 2020-04-05 DIAGNOSIS — R0789 Other chest pain: Secondary | ICD-10-CM | POA: Diagnosis not present

## 2020-04-05 DIAGNOSIS — M5414 Radiculopathy, thoracic region: Secondary | ICD-10-CM

## 2020-04-05 DIAGNOSIS — G542 Cervical root disorders, not elsewhere classified: Secondary | ICD-10-CM | POA: Diagnosis not present

## 2020-04-05 NOTE — Progress Notes (Signed)
° °  Subjective:    Patient ID: Jeffrey [name], male    DOB: 28-Feb-1971, 50 y.o.   MRN: [id number]  HPI Patient arrives to follow up on GERD, vertigo and chest pain. Patient seen by cardiology last week.  GERD symptoms are doing somewhat better with the medicine and he is also cut way back on alcohol he is also eating more healthy  His intermittent chest pain is still persistent he is supposed to have a coronary artery CTA coming up in the near future hopefully this will show everything is good and that way he will not have to stress about heart disease as part of his issues  He still has significant pain in the left chest pectoral region more than likely that this is related to impingement of the thoracic spine he is supposed to see [facility] in the near future I can see where the referral was placed but I do not see where they have called him we will connect with [facility] and see what is going on  Patient relates intermittent dizziness related to the Neurontin but he states it is not bad currently he would like to continue Neurontin but does not want to go up on the dose  He relates intermittent dizziness sometimes with turning his head sometimes with moving but it is not severe no headaches with this  Patient relates cervical pain and discomfort in the base of his neck radiates into both shoulders been going on ever since his last visit in the middle of December is getting worse makes it very difficult for him to get comfortable no weakness in the arms Review of Systems  Constitutional: Negative for activity change, fatigue and fever.  HENT: Negative for congestion and rhinorrhea.   Respiratory: Negative for cough and shortness of breath.   Cardiovascular: Negative for chest pain and leg swelling.  Gastrointestinal: Negative for abdominal pain, diarrhea and nausea.  Genitourinary: Negative for dysuria and hematuria.  Neurological: Negative for weakness and headaches.   Psychiatric/Behavioral: Negative for agitation and behavioral problems.       Objective:   Physical Exam Vitals reviewed.  Constitutional:      Appearance: He is well-nourished.  Cardiovascular:     Rate and Rhythm: Normal rate and regular rhythm.     Heart sounds: Normal heart sounds. No murmur heard.   Pulmonary:     Effort: Pulmonary effort is normal.     Breath sounds: Normal breath sounds.  Musculoskeletal:        General: No edema.  Lymphadenopathy:     Cervical: No cervical adenopathy.  Neurological:     Mental Status: He is alert.  Psychiatric:        Behavior: Behavior normal.   Neurologically he is fine no focal deficits        Assessment & Plan:  1. Gastroesophageal reflux disease, unspecified whether esophagitis present Reflux under decent control cut back on alcohol stay active take medicine follow-up if problems  2. Atypical chest pain Atypical chest pain coronary artery CTA coming up hopefully this will settle out that issue  3. Thoracic nerve root impingement Would like for the patient to be seen by [facility] we will connect with them to see what is going on  4. Cervical nerve root impingement We will connect with [facility] to see what is going on If they are unable to see him relatively soon we will progress toward MRI of the cervical spine

## 2020-04-05 NOTE — Patient Instructions (Signed)
Please do your lab work in the near future  We will work on getting your appointment with the spine specialist  Based upon this we may need to do the MRI sooner  Our office will call you in the near future giving you an update regarding this

## 2020-04-08 ENCOUNTER — Telehealth: Payer: Self-pay | Admitting: *Deleted

## 2020-04-08 DIAGNOSIS — K219 Gastro-esophageal reflux disease without esophagitis: Secondary | ICD-10-CM

## 2020-04-08 NOTE — Telephone Encounter (Signed)
Fax from [employer]. Pantoprazole was denied. See form in folder.

## 2020-04-08 NOTE — Telephone Encounter (Signed)
Nurses Please talk with patient This patient has a insurance that does not cover pantoprazole. According to his insurance he must try to have the following omeprazole, generic Prevacid which is lansoprazole, or generic Nexium which is esomeprazole All of these medications are PPIs which could help him and are in the same category as pantoprazole. Essentially his insurance is stating that he has to try to the over-the-counter's before they will provide for the prescription This is not an unusual issue According to his insurance he must try to have these Please see what has he tried so far? Thank you

## 2020-04-09 ENCOUNTER — Telehealth (HOSPITAL_COMMUNITY): Payer: Self-pay | Admitting: Emergency Medicine

## 2020-04-09 MED ORDER — PANTOPRAZOLE SODIUM 40 MG PO TBEC: DELAYED_RELEASE_TABLET | ORAL | 5 refills | Status: DC

## 2020-04-09 NOTE — Telephone Encounter (Signed)
Pt contacted. Pt states that he has tried Nexium and Prilosec. Nexium for 6 months to one year and Prilosec for about 3 months. The med did give some relief but pt states he still had reflux issues. Please advise. Thank you

## 2020-04-09 NOTE — Telephone Encounter (Signed)
Pt states he Tried nexium and it did not help

## 2020-04-09 NOTE — Telephone Encounter (Signed)
Reaching out to patient to offer assistance regarding upcoming cardiac imaging study; pt verbalizes understanding of appt date/time, parking situation and where to check in, pre-test NPO status and medications ordered, and verified current allergies; name and call back number provided for further questions should they arise Marchia Bond RN Navigator Cardiac Imaging [facility] [phone] office [phone] cell  Pt taking metop 2h PTA, avoiding cialis, inhaler, claritin Lyzette Reinhardt

## 2020-04-09 NOTE — Telephone Encounter (Signed)
So at this point according to the patient he is tried the Nexium and the omeprazole without success  I would recommend trying to get the pantoprazole covered.  Pantoprazole 40 mg, #30, 1 daily, 5 refills With the PA will need to include that he tried the Nexium for 6 months and omeprazole for 3 months Unfortunately if it is still not covered the next step would be referral to gastroenterology Also if it is covered if he does not improve over the next 4 to 6 weeks next step would be referral to gastroenterology in regards to his reflux

## 2020-04-09 NOTE — Telephone Encounter (Signed)
Pantoprazole sent to pharmacy. Will keep message in case PA comes through. Pt advised that we would attempt PA if need be and contact him with decision. Pt verbalized understanding.

## 2020-04-09 NOTE — Telephone Encounter (Signed)
Originally we prescribed pantoprazole which is a PPI  His insurance requires using OTC PPIs and trying at least 2 of them before they will help fill a prescription of  Typically it requires a 1 month trial  so based on his insurance rules (not ours) I would recommend the lansoprazole which is the generic of Prevacid Probably [facility] has the best price on this This is a PPI I would recommend trying it over the course of the next month if that does not seem to help notify us and we can send in the pantoprazole

## 2020-04-10 ENCOUNTER — Ambulatory Visit (HOSPITAL_COMMUNITY)
Admission: RE | Admit: 2020-04-10 | Discharge: 2020-04-10 | Disposition: A | Discharge status: Home / Self Care | Payer: BC Managed Care – PPO | Source: Ambulatory Visit | Attending: Cardiovascular Disease | Admitting: Cardiovascular Disease

## 2020-04-10 ENCOUNTER — Other Ambulatory Visit: Payer: Self-pay

## 2020-04-10 DIAGNOSIS — R079 Chest pain, unspecified: Secondary | ICD-10-CM | POA: Diagnosis not present

## 2020-04-10 MED ORDER — IOHEXOL 350 MG/ML SOLN
80.0000 mL | Freq: Once | INTRAVENOUS | Status: AC | PRN
  Administered 2020-04-10: 80 mL via INTRAVENOUS

## 2020-04-10 MED ORDER — METOPROLOL TARTRATE 5 MG/5ML IV SOLN
5.0000 mg | INTRAVENOUS | Status: DC | PRN
  Administered 2020-04-10: 5 mg via INTRAVENOUS

## 2020-04-10 MED ORDER — NITROGLYCERIN 0.4 MG SL SUBL
SUBLINGUAL_TABLET | SUBLINGUAL | Status: AC
  Administered 2020-04-10: 0.8 mg via SUBLINGUAL
  Filled 2020-04-10: qty 2

## 2020-04-10 MED ORDER — METOPROLOL TARTRATE 5 MG/5ML IV SOLN
INTRAVENOUS | Status: AC
  Administered 2020-04-10: 5 mg via INTRAVENOUS
  Filled 2020-04-10: qty 10

## 2020-04-10 MED ORDER — NITROGLYCERIN 0.4 MG SL SUBL: 0.8000 mg | SUBLINGUAL_TABLET | Freq: Once | SUBLINGUAL | Status: AC

## 2020-04-11 ENCOUNTER — Telehealth: Payer: Self-pay | Admitting: Family Medicine

## 2020-04-11 ENCOUNTER — Other Ambulatory Visit: Payer: Self-pay | Admitting: Family Medicine

## 2020-04-11 MED ORDER — TADALAFIL 5 MG PO TABS
5.0000 mg | ORAL_TABLET | Freq: Every day | ORAL | 5 refills | Status: DC
Start: 2020-04-11 — End: 2020-07-17

## 2020-04-11 NOTE — Telephone Encounter (Signed)
I sent the prescription According to Epic is still had multiple refills on it but obviously for some reason [facility] did not have that so therefore I sent he should be able to get the prescription at his convenience

## 2020-04-11 NOTE — Telephone Encounter (Signed)
Patient is checking a prescription for his cialis 5mg  he was seen on 04/05/20 and was told this would be called into [facility]

## 2020-04-11 NOTE — Telephone Encounter (Signed)
Sorry The previous message regarding 5 mg Cialis threw me for a loop So please send in Cialis 20 mg may use 1 taken 1 to 2 hours before relations no more than 1/day #5 with 6 refills Cancel the 5 mg Cialis Have the patient try this higher dose if that does not help enough to let us now Sorry for the inconvenience thank you

## 2020-04-12 MED ORDER — TADALAFIL 20 MG PO TABS: ORAL_TABLET | ORAL | 6 refills | Status: DC

## 2020-04-12 NOTE — Telephone Encounter (Signed)
Cialis 5 mg cancelled at pharmacy. 20 mg Cialis sent to [facility]. Pt is aware and verbalized understanding.   Pt state that he received a call back about "his heart stuff" and everything was clear.

## 2020-04-15 IMAGING — DX DG CERVICAL SPINE COMPLETE 4+V
6 series · 6 of 6 positions shown · non-contrast
Comparison: Radiograph 04/03/2009

CLINICAL DATA: Neck pain for 6 years, no known injury

EXAM:
CERVICAL SPINE - COMPLETE 4+ VIEW

[c-spine lat]
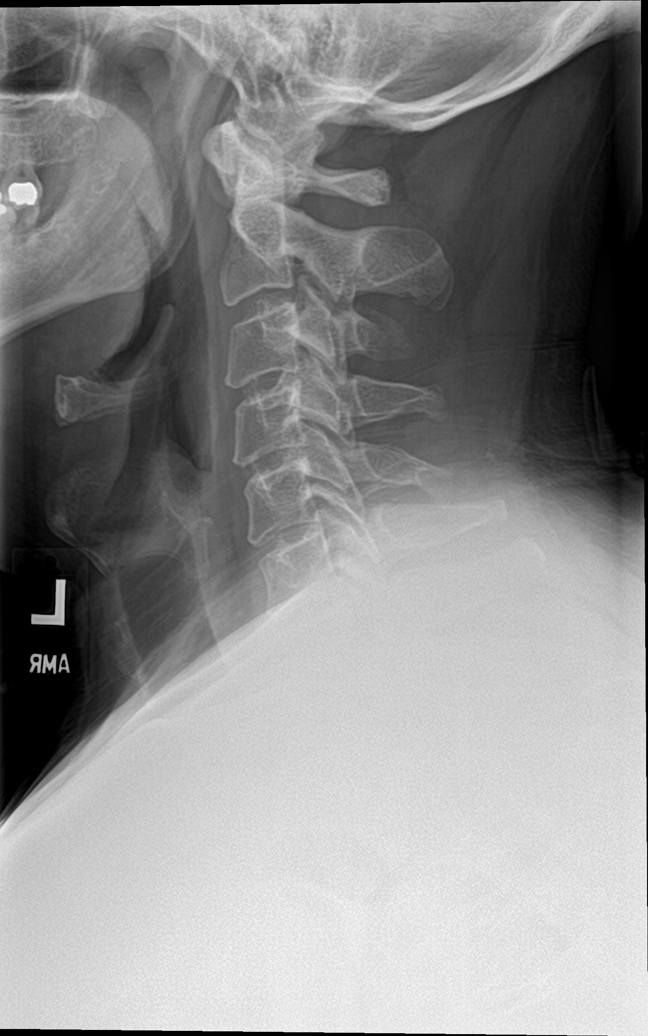

[c-spine obl (1 of 2)]
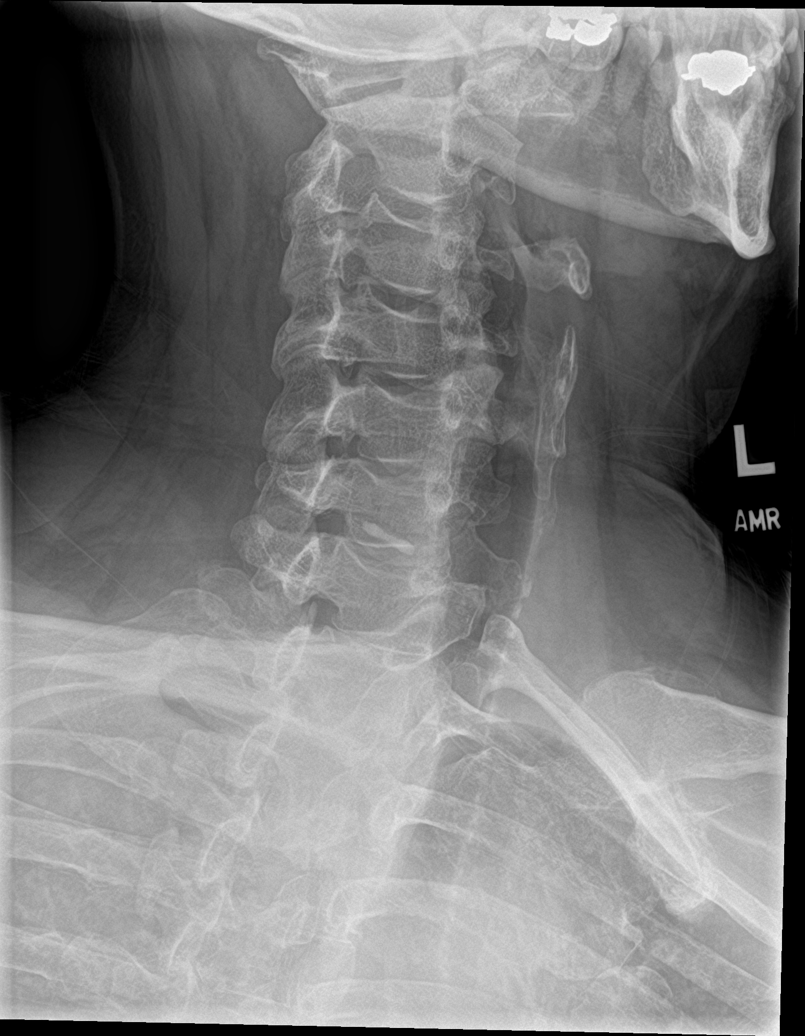

[c-spine obl (2 of 2)]
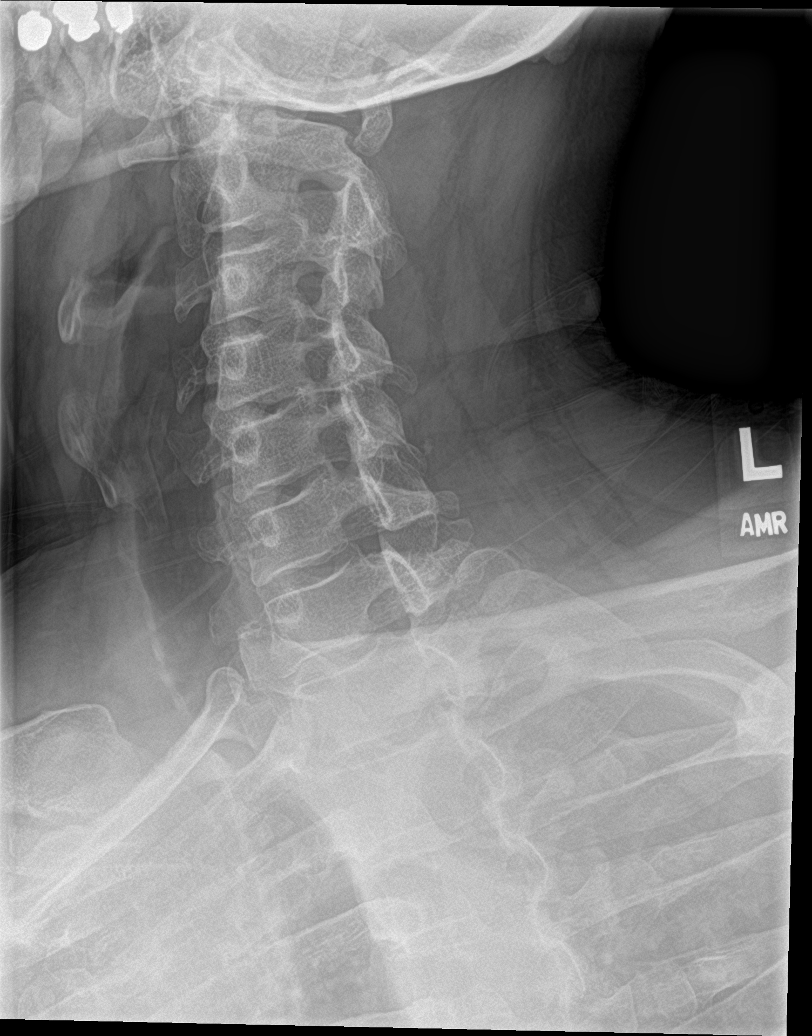

[c-spine ap]
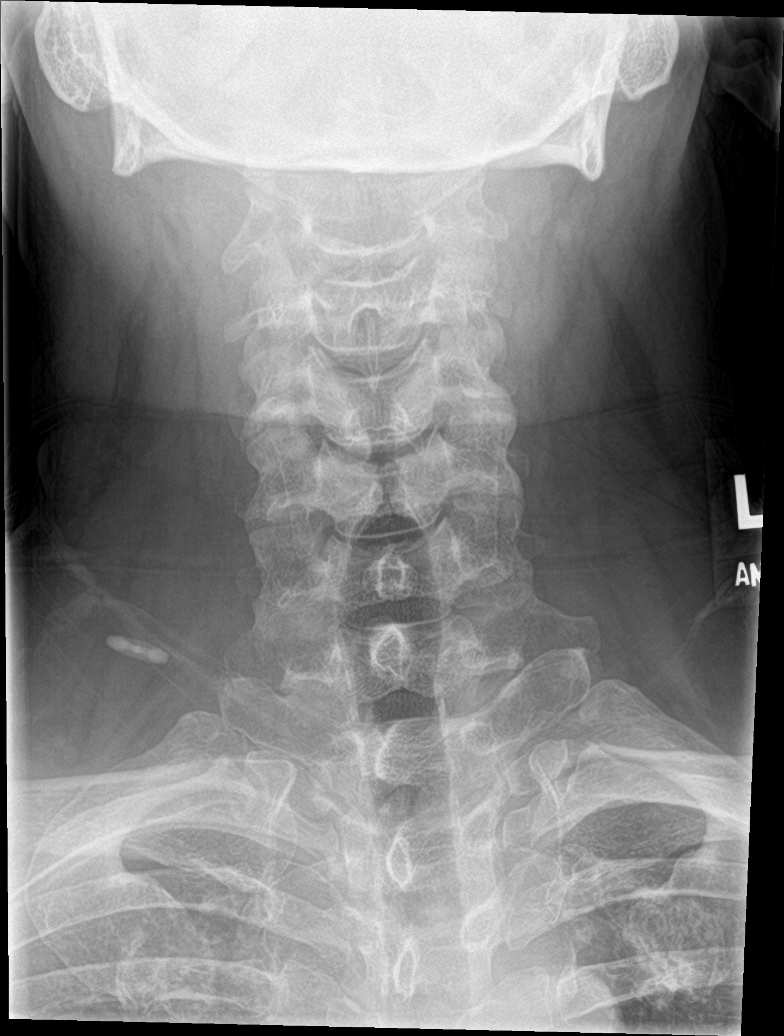

[c-spine open mouth]
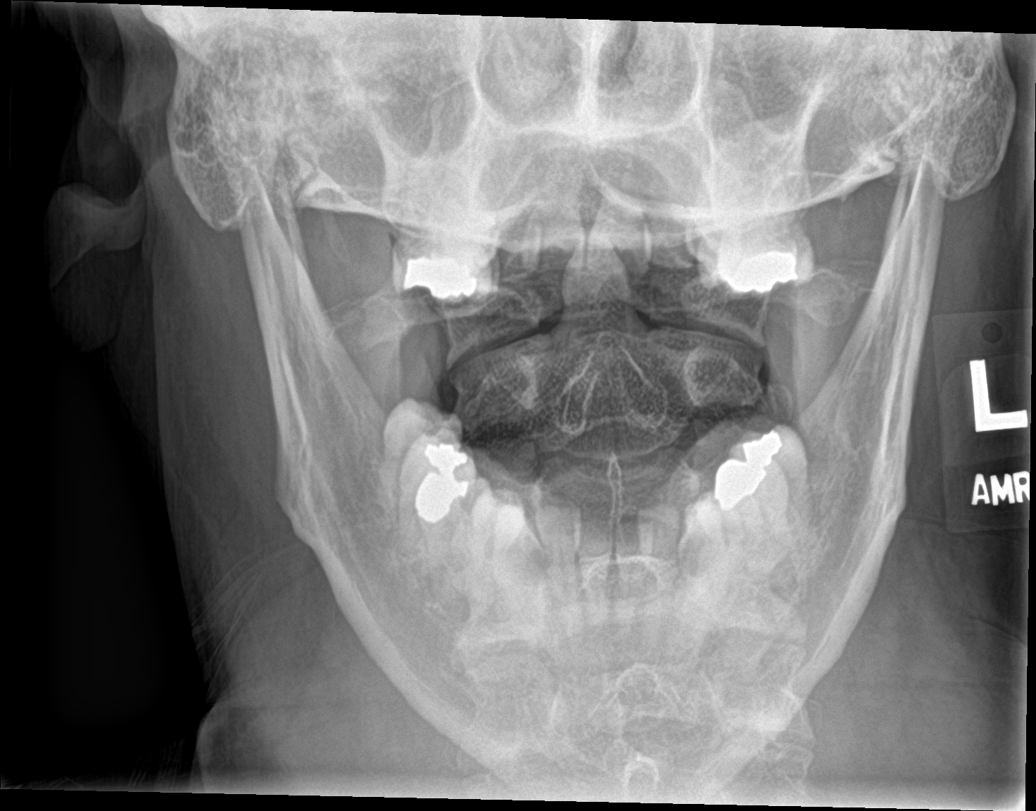

[c-spine swimmers trauma]
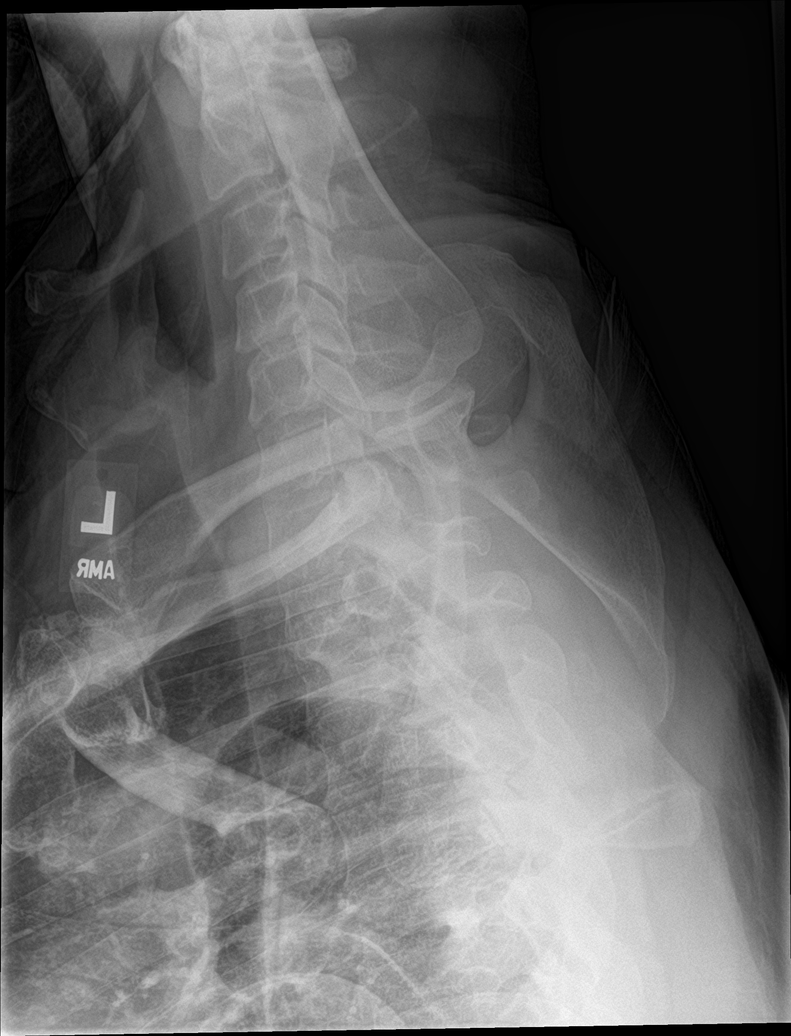

[6 of 6 positions shown; findings below may reference images not displayed]

FINDINGS: C7 vertebral body is largely obscured by soft tissues on the lateral
radiograph. Better visualized on the obliquities. No acute fracture
or traumatic listhesis. Posterior elements and vertebral bodies
appear normally aligned. Craniocervical and toe axial is maintained.
Lateral masses of C1 are well aligned to those of C2. Dens is
intact. Minimal discogenic changes present in the cervical spine.
Pre and paravertebral soft tissues are unremarkable. The airway is
patent.
IMPRESSION: 1. No acute fracture or malalignment.
2. Minimal discogenic changes in the cervical spine.

## 2020-04-17 DIAGNOSIS — M542 Cervicalgia: Secondary | ICD-10-CM | POA: Diagnosis not present

## 2020-04-17 DIAGNOSIS — M25511 Pain in right shoulder: Secondary | ICD-10-CM | POA: Diagnosis not present

## 2020-04-18 NOTE — Telephone Encounter (Signed)
Patient was able to pick up for $14 with [facility]- per pharmacy

## 2020-05-07 DIAGNOSIS — M542 Cervicalgia: Secondary | ICD-10-CM | POA: Diagnosis not present

## 2020-05-21 DIAGNOSIS — M5032 Other cervical disc degeneration, mid-cervical region, unspecified level: Secondary | ICD-10-CM | POA: Diagnosis not present

## 2020-05-21 DIAGNOSIS — M25511 Pain in right shoulder: Secondary | ICD-10-CM | POA: Diagnosis not present

## 2020-05-30 ENCOUNTER — Encounter: Payer: Self-pay | Admitting: Family Medicine

## 2020-05-30 ENCOUNTER — Ambulatory Visit (HOSPITAL_COMMUNITY)
Admission: RE | Admit: 2020-05-30 | Discharge: 2020-05-30 | Disposition: A | Discharge status: Home / Self Care | Payer: BC Managed Care – PPO | Source: Ambulatory Visit | Attending: Family Medicine | Admitting: Family Medicine

## 2020-05-30 ENCOUNTER — Ambulatory Visit: Payer: BC Managed Care – PPO | Admitting: Family Medicine

## 2020-05-30 ENCOUNTER — Other Ambulatory Visit: Payer: Self-pay

## 2020-05-30 VITALS — BP 122/82 | HR 95 | Temp 97.2°F | Ht 72.0 in | Wt 269.8 lb

## 2020-05-30 DIAGNOSIS — M546 Pain in thoracic spine: Secondary | ICD-10-CM | POA: Insufficient documentation

## 2020-05-30 DIAGNOSIS — R1084 Generalized abdominal pain: Secondary | ICD-10-CM | POA: Diagnosis not present

## 2020-05-30 NOTE — Progress Notes (Signed)
Patient ID: Jeffrey [name], male    DOB: 1971-02-08, 50 y.o.   MRN: [id number]   Chief Complaint  Patient presents with  . Right side rib pain- hurts with movement   Subjective:  CC: right sided rib pain  This is a new problem.  Presents today for an acute visit with a complaint of right rib soreness that is getting worse.  Reports that it "feels like pulling "sometimes relieved with twisting and movement.  The symptoms have been present for 2 weeks.  Denies numbness, tingling, radiating pain.  Is currently being worked up for neck pain, has had a neck MRI through [facility], cannot see these results in EMR.  He denies any injury.  No recent Covid infection.  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  He quit smoking about 15 years ago.  Does not have a gallbladder.  Denies fever, chills, chest pain or shortness of breath.  Endorses chest soreness around ribs and mid thoracic area of back.    Medical History Jeffrey [name] has a past medical history of COPD (chronic obstructive pulmonary disease) ([facility]) and GERD (gastroesophageal reflux disease).   Outpatient Encounter Medications as of 05/30/2020  Medication Sig  . acetaminophen (TYLENOL) 500 MG tablet Take 500-1,000 mg by mouth every 6 (six) hours as needed for mild pain, moderate pain or headache.   . albuterol (VENTOLIN HFA) 108 (90 Base) MCG/ACT inhaler Inhale 2 puffs into the lungs every 4 (four) hours as needed for wheezing or shortness of breath.  . Ascorbic Acid (VITAMIN C) 1000 MG tablet Take 1,000 mg by mouth daily.   Marland Kitchen gabapentin (NEURONTIN) 100 MG capsule Take 1 capsule (100 mg total) by mouth 3 (three) times daily.  Marland Kitchen loratadine (CLARITIN) 10 MG tablet Take 10 mg by mouth daily as needed for allergies.  Marland Kitchen meclizine (ANTIVERT) 25 MG tablet Take 1 tablet (25 mg total) by mouth 3 (three) times daily as needed for dizziness.  . metoprolol tartrate (LOPRESSOR) 100 MG tablet Take 100 mg  1-2 hours prior to your CT scan.  . pantoprazole (PROTONIX) 40 MG tablet Take one tablet po daily  . sucralfate (CARAFATE) 1 g tablet Take 1 tablet (1 g total) by mouth 4 (four) times daily -  with meals and at bedtime.  . tadalafil (CIALIS) 20 MG tablet Take one tablet po 1-2 hours before relations. No more than one per day.  . tadalafil (CIALIS) 5 MG tablet Take 1 tablet (5 mg total) by mouth daily.   No facility-administered encounter medications on file as of 05/30/2020.     Review of Systems  Constitutional: Negative for chills and fever.  Respiratory: Negative for cough, chest tightness and shortness of breath.        Chest soreness worse on right.   Cardiovascular: Negative for chest pain and leg swelling.  Musculoskeletal:       Right sided chest pain, worse with movement. Deep inspiration makes pain in center of back.      Vitals BP 122/82   Pulse 95   Temp (!) 97.2 F (36.2 C) (Oral)   Ht 6' (1.829 m)   Wt 269 lb 12.8 oz (122.4 kg)   SpO2 100%   BMI 36.59 kg/m   Objective:   Physical Exam Vitals reviewed.  Cardiovascular:     Rate and Rhythm: Normal rate and regular rhythm.     Heart sounds: Normal heart sounds.  Pulmonary:  Effort: Pulmonary effort is normal.     Breath sounds: Normal breath sounds.  Abdominal:     General: Bowel sounds are normal.     Tenderness: There is generalized abdominal tenderness.  Musculoskeletal:     Thoracic back: Tenderness present.  Skin:    General: Skin is warm and dry.  Neurological:     General: No focal deficit present.     Mental Status: He is alert.  Psychiatric:        Behavior: Behavior normal.       Assessment and Plan   1. Generalized abdominal pain - CBC with Differential - Comprehensive Metabolic Panel (CMET) - Amylase - Lipase  2. Acute thoracic back pain, unspecified back pain laterality - DG Thoracic Spine W/Swimmers   Upon physical exam, generalized abdominal tenderness noted.  Will get  labs to rule out pathology.  Due to soreness in thoracic area of spine, will get x-rays to ensure no fracture/injury.  Agrees with plan of care discussed today. Understands warning signs to seek further care: chest pain, shortness of breath, any significant change in health.  Understands to follow-up will be determined based on lab results and x-ray results, will notify once these results are available.  Pecolia Ades, NP 05/30/20

## 2020-05-30 NOTE — Patient Instructions (Signed)

## 2020-05-31 LAB — COMPREHENSIVE METABOLIC PANEL
ALT: 36 IU/L (ref 0–44)
AST: 21 IU/L (ref 0–40)
Albumin/Globulin Ratio: 1.7 (ref 1.2–2.2)
Albumin: 4.8 g/dL (ref 4.0–5.0)
Alkaline Phosphatase: 85 IU/L (ref 44–121)
BUN/Creatinine Ratio: 14 (ref 9–20)
BUN: 12 mg/dL (ref 6–24)
Bilirubin Total: 0.2 mg/dL (ref 0.0–1.2)
CO2: 23 mmol/L (ref 20–29)
Calcium: 9.4 mg/dL (ref 8.7–10.2)
Chloride: 100 mmol/L (ref 96–106)
Creatinine, Ser: 0.85 mg/dL (ref 0.76–1.27)
Globulin, Total: 2.9 g/dL (ref 1.5–4.5)
Glucose: 81 mg/dL (ref 65–99)
Potassium: 4.4 mmol/L (ref 3.5–5.2)
Sodium: 139 mmol/L (ref 134–144)
Total Protein: 7.7 g/dL (ref 6.0–8.5)
eGFR: 108 mL/min/{1.73_m2} (ref >59)

## 2020-05-31 LAB — CBC WITH DIFFERENTIAL/PLATELET
Basophils Absolute: 0 10*3/uL (ref 0.0–0.2)
Basos: 0 %
EOS (ABSOLUTE): 0.1 10*3/uL (ref 0.0–0.4)
Eos: 1 %
Hematocrit: 43.5 % (ref 37.5–51.0)
Hemoglobin: 14.8 g/dL (ref 13.0–17.7)
Immature Grans (Abs): 0 10*3/uL (ref 0.0–0.1)
Immature Granulocytes: 0 %
Lymphocytes Absolute: 1.2 10*3/uL (ref 0.7–3.1)
Lymphs: 26 %
MCH: 30.9 pg (ref 26.6–33.0)
MCHC: 34 g/dL (ref 31.5–35.7)
MCV: 91 fL (ref 79–97)
Monocytes Absolute: 0.5 10*3/uL (ref 0.1–0.9)
Monocytes: 12 %
Neutrophils Absolute: 2.8 10*3/uL (ref 1.4–7.0)
Neutrophils: 61 %
Platelets: 201 10*3/uL (ref 150–450)
RBC: 4.79 x10E6/uL (ref 4.14–5.80)
RDW: 13.1 % (ref 11.6–15.4)
WBC: 4.5 10*3/uL (ref 3.4–10.8)

## 2020-05-31 LAB — AMYLASE: Amylase: 33 U/L (ref 31–110)

## 2020-05-31 LAB — LIPASE: Lipase: 35 U/L (ref 13–78)

## 2020-06-12 DIAGNOSIS — M25511 Pain in right shoulder: Secondary | ICD-10-CM | POA: Diagnosis not present

## 2020-07-06 ENCOUNTER — Other Ambulatory Visit: Payer: Self-pay | Admitting: Family Medicine

## 2020-07-10 DIAGNOSIS — M19011 Primary osteoarthritis, right shoulder: Secondary | ICD-10-CM | POA: Diagnosis not present

## 2020-07-17 ENCOUNTER — Ambulatory Visit (INDEPENDENT_AMBULATORY_CARE_PROVIDER_SITE_OTHER): Payer: BC Managed Care – PPO | Admitting: Gastroenterology

## 2020-07-17 ENCOUNTER — Encounter: Payer: Self-pay | Admitting: Gastroenterology

## 2020-07-17 VITALS — BP 135/89 | HR 82 | Temp 97.7°F | Ht 72.0 in | Wt 271.4 lb

## 2020-07-17 DIAGNOSIS — Z8719 Personal history of other diseases of the digestive system: Secondary | ICD-10-CM | POA: Diagnosis not present

## 2020-07-17 DIAGNOSIS — K219 Gastro-esophageal reflux disease without esophagitis: Secondary | ICD-10-CM | POA: Diagnosis not present

## 2020-07-17 DIAGNOSIS — R1013 Epigastric pain: Secondary | ICD-10-CM

## 2020-07-17 MED ORDER — OMEPRAZOLE 20 MG PO CPDR: 20.0000 mg | DELAYED_RELEASE_CAPSULE | Freq: Two times a day (BID) | ORAL | 1 refills | Status: DC

## 2020-07-17 NOTE — Patient Instructions (Signed)
1. Upper endoscopy in the near future.  See separate instructions. 2. Stop pantoprazole.  Begin omeprazole 20 mg twice daily, 30 minutes before breakfast and 30 minutes before evening meal. 3. Continue to cut back on alcohol consumption.  Recommend no more than three 12 ounce beers in a 24-hour period of time.

## 2020-07-17 NOTE — Progress Notes (Signed)
CC'ED TO PCP 

## 2020-07-17 NOTE — Progress Notes (Signed)
Primary Care Physician: Kathyrn Drown, MD  Primary Gastroenterologist:  Elon Alas. Abbey Chatters, DO   Chief Complaint  Patient presents with  . Gastroesophageal Reflux    Burning in chest, bloating that comes/goes, belching when very bloated    HPI: Jeffrey [name] is a 50 y.o. male here for further evaluation of upper abdominal bloating, epigastric pain, GERD symptoms.  History of gastritis with focal intestinal metaplasia, pyloric stenosis status post dilation.  Patient was last seen in August 2021 and was scheduled for colonoscopy at that time.  See details below.  Last office visit it was felt that his GERD was not adequately controlled on Nexium 20 mg daily.  He was started on pantoprazole 40 mg daily.  Patient states he is actually been taking pantoprazole twice daily for at least 6 months.  He has had progressive upper GI symptoms.  Frequent heartburn, postprandial epigastric burning/bloating that can have bloating even without meals.  Frequent regurgitation.  If he bends over, releases significant amount of air and will have temporary improvement.  Denies dysphagia.  Denies melena or rectal bleeding.  He has been evaluated by cardiology for atypical chest pain.  CT coronary study unremarkable.    Continues to consume beer 3 times per week.  Averages 18 beers per week.  States he used to drink a case per day.  He denies any NSAID or aspirin use.  Denies any unintentional weight loss.   Colonoscopy November 2021 with 4 polyps removed, multiple tubular adenomas, nonbleeding hemorrhoids, normal terminal ileum.  Plan for 5-year surveillance study.  Prior history of tubulovillous adenoma in 2011.  EGD 2015 with normal-appearing esophagus status post empiric dilation, mild erosive gastritis with reactive/regenerative changes and focal intestinal metaplasia but no H. pylori, small stenosis traversable after dilation at the pylorus, normal examined duodenum.  Current Outpatient  Medications  Medication Sig Dispense Refill  . acetaminophen (TYLENOL) 500 MG tablet Take 500-1,000 mg by mouth every 6 (six) hours as needed for mild pain, moderate pain or headache.     . albuterol (VENTOLIN HFA) 108 (90 Base) MCG/ACT inhaler INHALE 2 PUFFS BY MOUTH EVERY 4 HOURS AS NEEDED FOR WHEEZING FOR SHORTNESS OF BREATH 18 g 2  . Ascorbic Acid (VITAMIN C) 1000 MG tablet Take 1,000 mg by mouth daily.     Marland Kitchen gabapentin (NEURONTIN) 100 MG capsule Take 1 capsule (100 mg total) by mouth 3 (three) times daily. 90 capsule 4  . loratadine (CLARITIN) 10 MG tablet Take 10 mg by mouth daily as needed for allergies.    Marland Kitchen meclizine (ANTIVERT) 25 MG tablet Take 1 tablet (25 mg total) by mouth 3 (three) times daily as needed for dizziness. 30 tablet 0  . metoprolol tartrate (LOPRESSOR) 100 MG tablet Take 100 mg 1-2 hours prior to your CT scan. 1 tablet 0  . pantoprazole (PROTONIX) 40 MG tablet Take one tablet po daily (Patient taking differently: Take 40 mg by mouth 2 (two) times daily. Take one tablet po daily) 30 tablet 5  . sucralfate (CARAFATE) 1 g tablet Take 1 tablet (1 g total) by mouth 4 (four) times daily -  with meals and at bedtime. (Patient taking differently: Take 1 g by mouth in the morning, at noon, and at bedtime.) 90 tablet 0  . tadalafil (CIALIS) 20 MG tablet Take one tablet po 1-2 hours before relations. No more than one per day. 5 tablet 6   No current facility-administered medications for this visit.  Allergies as of 07/17/2020  . (No Known Allergies)   Past Medical History:  Diagnosis Date  . COPD (chronic obstructive pulmonary disease) ([place])   . GERD (gastroesophageal reflux disease)    Past Surgical History:  Procedure Laterality Date  . cardiac stress test    . CHOLECYSTECTOMY    . COLONOSCOPY N/A 09/19/2013   Procedure: COLONOSCOPY;  Surgeon: [name], MD;  Location: [facility];  Service: Endoscopy;  normal TI, 1 colon polyp removed but not retrieved, mild  sigmoid diverticulosis with associated luminal narrowing, moderate sized external hemorrhoids.  Recommended repeat colonoscopy in 5 years.   . COLONOSCOPY  2011   Dr. 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 COLONOSCOPY WITH PROPOFOL N/A 01/09/2020   Procedure: COLONOSCOPY WITH PROPOFOL;  Surgeon: Eloise Harman, DO;  Location: [facility];  Service: Endoscopy;  Laterality: N/A;  1:30pm  . ESOPHAGOGASTRODUODENOSCOPY N/A 09/19/2013   Procedure: ESOPHAGOGASTRODUODENOSCOPY (EGD);  Surgeon: [name], MD; normal-appearing esophagus s/p empiric dilation, mild erosive gastritis s/p biopsy, small stenosis transversable after dilation at the pylorus, normal examined duodenum.  Gastric biopsies with reactive/regenerative changes and focal intestinal metaplasia with no H. pylori, dysplasia, or malignancy.  . ESOPHAGOGASTRODUODENOSCOPY ENDOSCOPY    . MALONEY DILATION N/A 09/19/2013   Procedure: MALONEY DILATION;  Surgeon: [name], MD;  Location: [facility];  Service: Endoscopy;  Laterality: N/A;  . POLYPECTOMY  01/09/2020   Procedure: POLYPECTOMY;  Surgeon: Eloise Harman, DO;  Location: [facility];  Service: Endoscopy;;  . SAVORY DILATION N/A 09/19/2013   Procedure: SAVORY DILATION;  Surgeon: [name], MD;  Location: [facility];  Service: Endoscopy;  Laterality: N/A;   Family History  Problem Relation Age of Onset  . Colon cancer Neg Hx   . Colon polyps Neg Hx    Social History   Tobacco Use  . Smoking status: Former Research scientist (life sciences)  . Smokeless tobacco: Current User  Vaping Use  . Vaping Use: Never used  Substance Use Topics  . Alcohol use: Yes    Comment: 18 pack per week  . Drug use: Yes    Types: Marijuana    Comment: occas    ROS:  General: Negative for anorexia, weight loss, fever, chills, fatigue, weakness. ENT: Negative for hoarseness, difficulty swallowing , nasal congestion. CV: Negative for chest pain, angina, palpitations, dyspnea on exertion,  peripheral edema.  Respiratory: Negative for dyspnea at rest, dyspnea on exertion, cough, sputum, wheezing.  GI: See history of present illness. GU:  Negative for dysuria, hematuria, urinary incontinence, urinary frequency, nocturnal urination.  Endo: Negative for unusual weight change.    Physical Examination:   BP 135/89   Pulse 82   Temp 97.7 F (36.5 C)   Ht 6' (1.829 m)   Wt 271 lb 6.4 oz (123.1 kg)   BMI 36.81 kg/m   General: Well-nourished, well-developed in no acute distress.  Eyes: No icterus. Mouth: masked Lungs: Clear to auscultation bilaterally.  Heart: Regular rate and rhythm, no murmurs rubs or gallops.  Abdomen: Bowel sounds are normal, nontender, nondistended, no hepatosplenomegaly or masses, no abdominal bruits or hernia , no rebound or guarding.   Extremities: No lower extremity edema. No clubbing or deformities. Neuro: Alert and oriented x 4   Skin: Warm and dry, no jaundice.   Psych: Alert and cooperative, normal mood and affect.  Labs:  Lab Results  Component Value Date   CREATININE 0.85 05/30/2020   BUN 12  05/30/2020   NA 139 05/30/2020   K 4.4 05/30/2020   CL 100 05/30/2020   CO2 23 05/30/2020   Lab Results  Component Value Date   ALT 36 05/30/2020   AST 21 05/30/2020   ALKPHOS 85 05/30/2020   BILITOT 0.2 05/30/2020   Lab Results  Component Value Date   WBC 4.5 05/30/2020   HGB 14.8 05/30/2020   HCT 43.5 05/30/2020   MCV 91 05/30/2020   PLT 201 05/30/2020   Lab Results  Component Value Date   AMYLASE 33 05/30/2020   Lab Results  Component Value Date   LIPASE 35 05/30/2020      Imaging Studies: No results found.   Assessment: Pleasant 50 year old male presenting with several month history of progressive upper GI symptoms including heartburn, epigastric burning, upper abdominal bloating which is worse postprandially.  No improvement with PPI twice daily.  Continues with significant alcohol use 3 days/week which may be  contributing.  Prior history of gastritis with intestinal metaplasia and small stenosis transversal after dilation of the pylorus.  Symptoms may be secondary to gastritis/peptic ulcer disease/refractory GERD but cannot exclude partial gastric outlet obstruction related to pyloric stenosis.    Plan: 1. EGD with possible dilation of the pylorus with Dr. Abbey Chatters. ASA II.  I have discussed the risks, alternatives, benefits with regards to but not limited to the risk of reaction to medication, bleeding, infection, perforation and the patient is agreeable to proceed. Written consent to be obtained. 2. Switch pantoprazole to omeprazole 20 mg twice daily before meals. 3. Recommend further alcohol reduction.

## 2020-07-31 DIAGNOSIS — Z6836 Body mass index (BMI) 36.0-36.9, adult: Secondary | ICD-10-CM | POA: Diagnosis not present

## 2020-07-31 DIAGNOSIS — Z1331 Encounter for screening for depression: Secondary | ICD-10-CM | POA: Diagnosis not present

## 2020-07-31 DIAGNOSIS — Z0000 Encounter for general adult medical examination without abnormal findings: Secondary | ICD-10-CM | POA: Diagnosis not present

## 2020-07-31 DIAGNOSIS — E6609 Other obesity due to excess calories: Secondary | ICD-10-CM | POA: Diagnosis not present

## 2020-08-13 ENCOUNTER — Telehealth: Payer: Self-pay

## 2020-08-13 NOTE — Telephone Encounter (Signed)
Melanie at [facility] called office, she was unable to contact pt to let him know he doesn't need covid test prior to upcoming procedure.  Called and informed pt he doesn't need to do covid test 08/16/20. Covid test appt cancelled.

## 2020-08-16 ENCOUNTER — Ambulatory Visit (INDEPENDENT_AMBULATORY_CARE_PROVIDER_SITE_OTHER): Payer: BC Managed Care – PPO | Admitting: Nurse Practitioner

## 2020-08-16 ENCOUNTER — Encounter: Payer: Self-pay | Admitting: Nurse Practitioner

## 2020-08-16 ENCOUNTER — Other Ambulatory Visit (HOSPITAL_COMMUNITY): Payer: BC Managed Care – PPO

## 2020-08-16 ENCOUNTER — Other Ambulatory Visit: Payer: Self-pay

## 2020-08-16 VITALS — BP 126/88 | HR 86 | Temp 98.4°F | Ht 72.0 in | Wt 265.0 lb

## 2020-08-16 DIAGNOSIS — M10072 Idiopathic gout, left ankle and foot: Secondary | ICD-10-CM

## 2020-08-16 MED ORDER — PREDNISONE 20 MG PO TABS: ORAL_TABLET | ORAL | 0 refills | Status: DC

## 2020-08-16 NOTE — Patient Instructions (Signed)
Low-Purine Eating Plan A low-purine eating plan involves making food choices to limit your intake of purine. Purine is a kind of uric acid. Too much uric acid in your blood can cause certain conditions, such as gout and kidney stones. Eating a low-purinediet can help control these conditions. What are tips for following this plan? Reading food labels Avoid foods with saturated or Trans fat. Check the ingredient list of grains-based foods, such as bread and cereal, to make sure that they contain whole grains. Check the ingredient list of sauces or soups to make sure they do not contain meat or fish. When choosing soft drinks, check the ingredient list to make sure they do not contain high-fructose corn syrup. Shopping  Buy plenty of fresh fruits and vegetables. Avoid buying canned or fresh fish. Buy dairy products labeled as low-fat or nonfat. Avoid buying premade or processed foods. These foods are often high in fat, salt (sodium), and added sugar.  Cooking Use olive oil instead of butter when cooking. Oils like olive oil, canola oil, and sunflower oil contain healthy fats. Meal planning Learn which foods do or do not affect you. If you find out that a food tends to cause your gout symptoms to flare up, avoid eating that food. You can enjoy foods that do not cause problems. If you have any questions about a food item, talk with your dietitian or health care provider. Limit foods high in fat, especially saturated fat. Fat makes it harder for your body to get rid of uric acid. Choose foods that are lower in fat and are lean sources of protein. General guidelines Limit alcohol intake to no more than 1 drink a day for nonpregnant women and 2 drinks a day for men. One drink equals 12 oz of beer, 5 oz of wine, or 1 oz of hard liquor. Alcohol can affect the way your body gets rid of uric acid. Drink plenty of water to keep your urine clear or pale yellow. Fluids can help remove uric acid from your  body. If directed by your health care provider, take a vitamin C supplement. Work with your health care provider and dietitian to develop a plan to achieve or maintain a healthy weight. Losing weight can help reduce uric acid in your blood. What foods are recommended? The items listed may not be a complete list. Talk with your dietitian aboutwhat dietary choices are best for you. Foods low in purines Foods low in purines do not need to be limited. These include: All fruits. All low-purine vegetables, pickles, and olives. Breads, pasta, rice, cornbread, and popcorn. Cake and other baked goods. All dairy foods. Eggs, nuts, and nut butters. Spices and condiments, such as salt, herbs, and vinegar. Plant oils, butter, and margarine. Water, sugar-free soft drinks, tea, coffee, and cocoa. Vegetable-based soups, broths, sauces, and gravies. Foods moderate in purines Foods moderate in purines should be limited to the amounts listed.  cup of asparagus, cauliflower, spinach, mushrooms, or green peas, each day. 2/3 cup uncooked oatmeal, each day.  cup dry wheat bran or wheat germ, each day. 2-3 ounces of meat or poultry, each day. 4-6 ounces of shellfish, such as crab, lobster, oysters, or shrimp, each day. 1 cup cooked beans, peas, or lentils, each day. Soup, broths, or bouillon made from meat or fish. Limit these foods as much as possible. What foods are not recommended? The items listed may not be a complete list. Talk with your dietitian aboutwhat dietary choices are best for you.   Limit your intake of foods high in purines, including: Beer and other alcohol. Meat-based gravy or sauce. Canned or fresh fish, such as: Anchovies, sardines, herring, and tuna. Mussels and scallops. Codfish, trout, and haddock. Bacon. Organ meats, such as: Liver or kidney. Tripe. Sweetbreads (thymus gland or pancreas). Wild game or goose. Yeast or yeast extract supplements. Drinks sweetened with  high-fructose corn syrup. Summary Eating a low-purine diet can help control conditions caused by too much uric acid in the body, such as gout or kidney stones. Choose low-purine foods, limit alcohol, and limit foods high in fat. You will learn over time which foods do or do not affect you. If you find out that a food tends to cause your gout symptoms to flare up, avoid eating that food. This information is not intended to replace advice given to you by your health care provider. Make sure you discuss any questions you have with your healthcare provider. Document Revised: 06/08/2019 Document Reviewed: 06/08/2019 Elsevier Patient Education  2022 Elsevier Inc.  

## 2020-08-16 NOTE — Progress Notes (Signed)
   Subjective:    Patient ID: Jeffrey [name], male    DOB: 10-31-1970, 50 y.o.   MRN: [id number]  HPI  Left foot pain and swelling x 3 days taking nsaids for pain Pain is localized to the left lateral great toe at the podagra.  Slight temporary relief with Goody powders or ibuprofen.  No history of injury.  No fever.  No other joint involvement.  No previous history of gout.       Objective:   Physical Exam NAD.  Alert, oriented.  Lungs clear.  Heart regular rate rhythm.  Mild edema, moderate erythema and warmth with moderate tenderness noted at the left podagra.  Significant tenderness noted with movement of the left great toe. Today's Vitals   08/16/20 1103  BP: 126/88  Pulse: 86  Temp: 98.4 F (36.9 C)  SpO2: 99%  Weight: 265 lb (120.2 kg)  Height: 6' (1.829 m)   Body mass index is 35.94 kg/m.        Assessment & Plan:  Acute idiopathic gout involving toe of left foot - Plan: Uric acid Uric acid level pending. Given information on low purine diet. Meds ordered this encounter  Medications   predniSONE (DELTASONE) 20 MG tablet    Sig: 3 po qd x 3 d then 2 po qd x 3 d then 1 po qd x 2 d    Dispense:  17 tablet    Refill:  0    Order Specific Question:   Supervising Provider    Answer:   Sallee Lange A [[id number]]   Call back next week if no improvement, sooner if worse.  Also contact office if any recurrent gout symptoms.

## 2020-08-17 LAB — URIC ACID: Uric Acid: 8.3 mg/dL (ref 3.8–8.4)

## 2020-08-19 ENCOUNTER — Ambulatory Visit (HOSPITAL_COMMUNITY)
Admission: RE | Admit: 2020-08-19 | Discharge: 2020-08-19 | Disposition: A | Discharge status: Home / Self Care | Payer: BC Managed Care – PPO | Attending: Internal Medicine | Admitting: Internal Medicine

## 2020-08-19 ENCOUNTER — Ambulatory Visit (HOSPITAL_COMMUNITY): Payer: BC Managed Care – PPO | Admitting: Certified Registered"

## 2020-08-19 ENCOUNTER — Encounter (HOSPITAL_COMMUNITY): Payer: Self-pay

## 2020-08-19 ENCOUNTER — Encounter (HOSPITAL_COMMUNITY)
Admission: RE | Disposition: A | Discharge status: Home / Self Care | Payer: Self-pay | Source: Home / Self Care | Attending: Internal Medicine

## 2020-08-19 ENCOUNTER — Other Ambulatory Visit: Payer: Self-pay

## 2020-08-19 DIAGNOSIS — R140 Abdominal distension (gaseous): Secondary | ICD-10-CM | POA: Diagnosis not present

## 2020-08-19 DIAGNOSIS — K317 Polyp of stomach and duodenum: Secondary | ICD-10-CM | POA: Diagnosis not present

## 2020-08-19 DIAGNOSIS — J449 Chronic obstructive pulmonary disease, unspecified: Secondary | ICD-10-CM | POA: Insufficient documentation

## 2020-08-19 DIAGNOSIS — K2950 Unspecified chronic gastritis without bleeding: Secondary | ICD-10-CM | POA: Diagnosis not present

## 2020-08-19 DIAGNOSIS — R12 Heartburn: Secondary | ICD-10-CM | POA: Diagnosis not present

## 2020-08-19 DIAGNOSIS — Z87891 Personal history of nicotine dependence: Secondary | ICD-10-CM | POA: Insufficient documentation

## 2020-08-19 DIAGNOSIS — Z7952 Long term (current) use of systemic steroids: Secondary | ICD-10-CM | POA: Insufficient documentation

## 2020-08-19 DIAGNOSIS — K311 Adult hypertrophic pyloric stenosis: Secondary | ICD-10-CM | POA: Diagnosis not present

## 2020-08-19 DIAGNOSIS — Z79899 Other long term (current) drug therapy: Secondary | ICD-10-CM | POA: Insufficient documentation

## 2020-08-19 DIAGNOSIS — Z8719 Personal history of other diseases of the digestive system: Secondary | ICD-10-CM | POA: Diagnosis not present

## 2020-08-19 DIAGNOSIS — K219 Gastro-esophageal reflux disease without esophagitis: Secondary | ICD-10-CM | POA: Diagnosis not present

## 2020-08-19 HISTORY — PX: BIOPSY: SHX5522

## 2020-08-19 HISTORY — PX: ESOPHAGOGASTRODUODENOSCOPY (EGD) WITH PROPOFOL: SHX5813

## 2020-08-19 HISTORY — PX: POLYPECTOMY: SHX5525

## 2020-08-19 HISTORY — PX: BALLOON DILATION: SHX5330

## 2020-08-19 SURGERY — ESOPHAGOGASTRODUODENOSCOPY (EGD) WITH PROPOFOL
Anesthesia: General

## 2020-08-19 MED ORDER — LACTATED RINGERS IV SOLN: INTRAVENOUS | Status: DC

## 2020-08-19 MED ORDER — PROPOFOL 500 MG/50ML IV EMUL
INTRAVENOUS | Status: DC | PRN
  Administered 2020-08-19: 125 ug/kg/min via INTRAVENOUS

## 2020-08-19 MED ORDER — LIDOCAINE HCL (CARDIAC) PF 100 MG/5ML IV SOSY
PREFILLED_SYRINGE | INTRAVENOUS | Status: DC | PRN
  Administered 2020-08-19: 100 mg via INTRATRACHEAL

## 2020-08-19 MED ORDER — PROPOFOL 10 MG/ML IV BOLUS
INTRAVENOUS | Status: DC | PRN
  Administered 2020-08-19: 150 mg via INTRAVENOUS
  Administered 2020-08-19: 50 mg via INTRAVENOUS
  Administered 2020-08-19: 100 mg via INTRAVENOUS

## 2020-08-19 NOTE — Anesthesia Procedure Notes (Signed)
Date/Time: 08/19/2020 10:03 AM Performed by: Vista Deck, CRNA Pre-anesthesia Checklist: Patient identified, Emergency Drugs available, Suction available, Timeout performed and Patient being monitored Patient Re-evaluated:Patient Re-evaluated prior to induction Oxygen Delivery Method: Nasal Cannula

## 2020-08-19 NOTE — Transfer of Care (Signed)
Immediate Anesthesia Transfer of Care Note  Patient: Jeffrey [name]  Procedure(s) Performed: ESOPHAGOGASTRODUODENOSCOPY (EGD) WITH PROPOFOL BALLOON DILATION BIOPSY POLYPECTOMY  Patient Location: Endoscopy Unit  Anesthesia Type:General  Level of Consciousness: awake, alert  and patient cooperative  Airway & Oxygen Therapy: Patient Spontanous Breathing  Post-op Assessment: Report given to RN and Post -op Vital signs reviewed and stable  Post vital signs: Reviewed and stable  Last Vitals:  Vitals Value Taken Time  BP    Temp    Pulse    Resp    SpO2      Last Pain:  Vitals:   08/19/20 1005  TempSrc:   PainSc: 4       Patients Stated Pain Goal: 8 (03/70/96 4383)  Complications: No notable events documented.

## 2020-08-19 NOTE — Discharge Instructions (Addendum)
EGD Discharge instructions Please read the instructions outlined below and refer to this sheet in the next few weeks. These discharge instructions provide you with general information on caring for yourself after you leave the hospital. Your doctor may also give you specific instructions. While your treatment has been planned according to the most current medical practices available, unavoidable complications occasionally occur. If you have any problems or questions after discharge, please call your doctor. ACTIVITY You may resume your regular activity but move at a slower pace for the next 24 hours.  Take frequent rest periods for the next 24 hours.  Walking will help expel (get rid of) the air and reduce the bloated feeling in your abdomen.  No driving for 24 hours (because of the anesthesia (medicine) used during the test).  You may shower.  Do not sign any important legal documents or operate any machinery for 24 hours (because of the anesthesia used during the test).  NUTRITION Drink plenty of fluids.  You may resume your normal diet.  Begin with a light meal and progress to your normal diet.  Avoid alcoholic beverages for 24 hours or as instructed by your caregiver.  MEDICATIONS You may resume your normal medications unless your caregiver tells you otherwise.  WHAT YOU CAN EXPECT TODAY You may experience abdominal discomfort such as a feeling of fullness or "gas" pains.  FOLLOW-UP Your doctor will discuss the results of your test with you.  SEEK IMMEDIATE MEDICAL ATTENTION IF ANY OF THE FOLLOWING OCCUR: Excessive nausea (feeling sick to your stomach) and/or vomiting.  Severe abdominal pain and distention (swelling).  Trouble swallowing.  Temperature over 101 F (37.8 C).  Rectal bleeding or vomiting of blood.   Your upper endoscopy revealed a large polyp in the stomach.  I removed this with a hot snare.  Await pathology results, my office will contact you.  I also took biopsies of  your stomach to rule out H. pylori which is a bacteria that can cause chronic gastritis.  The pylorus which connects your stomach to your small bowel did appear tight today so I dilated with balloon.  Hopefully this helps with your pain and bloating.  Continue on omeprazole twice daily.  Follow-up with GI in 3 months.    I hope you have a great rest of your week!  Elon Alas. Abbey Chatters, D.O. Gastroenterology and Hepatology [facility]

## 2020-08-19 NOTE — Anesthesia Postprocedure Evaluation (Signed)
Anesthesia Post Note  Patient: Jeffrey [name]  Procedure(s) Performed: ESOPHAGOGASTRODUODENOSCOPY (EGD) WITH PROPOFOL BALLOON DILATION BIOPSY POLYPECTOMY  Patient location during evaluation: Phase II Anesthesia Type: General Level of consciousness: awake Pain management: pain level controlled Vital Signs Assessment: post-procedure vital signs reviewed and stable Respiratory status: spontaneous breathing and respiratory function stable Cardiovascular status: blood pressure returned to baseline and stable Postop Assessment: no headache and no apparent nausea or vomiting Anesthetic complications: no Comments: Late entry   No notable events documented.   Last Vitals:  Vitals:   08/19/20 0917 08/19/20 1027  BP: 121/82 109/68  Pulse: 75 77  Resp: 20 18  Temp: 36.5 C (!) 36.4 C  SpO2: 98% 99%    Last Pain:  Vitals:   08/19/20 1027  TempSrc: Oral  PainSc: [place]

## 2020-08-19 NOTE — Anesthesia Preprocedure Evaluation (Signed)
Anesthesia Evaluation  Patient identified by MRN, date of birth, ID band Patient awake    Reviewed: Allergy & Precautions, H&P , NPO status , Patient's Chart, lab work & pertinent test results, reviewed documented beta blocker date and time   Airway Mallampati: II  TM Distance: >3 FB Neck ROM: full    Dental no notable dental hx.    Pulmonary COPD, former smoker,    Pulmonary exam normal breath sounds clear to auscultation       Cardiovascular Exercise Tolerance: Good negative cardio ROS   Rhythm:regular Rate:Normal     Neuro/Psych negative neurological ROS  negative psych ROS   GI/Hepatic GERD  Medicated,(+)     substance abuse  alcohol use,   Endo/Other  negative endocrine ROS  Renal/GU negative Renal ROS  negative genitourinary   Musculoskeletal   Abdominal   Peds  Hematology negative hematology ROS (+)   Anesthesia Other Findings   Reproductive/Obstetrics negative OB ROS                             Anesthesia Physical Anesthesia Plan  ASA: 3  Anesthesia Plan: General   Post-op Pain Management:    Induction:   PONV Risk Score and Plan: Propofol infusion  Airway Management Planned:   Additional Equipment:   Intra-op Plan:   Post-operative Plan:   Informed Consent: I have reviewed the patients History and Physical, chart, labs and discussed the procedure including the risks, benefits and alternatives for the proposed anesthesia with the patient or authorized representative who has indicated his/her understanding and acceptance.     Dental Advisory Given  Plan Discussed with: CRNA  Anesthesia Plan Comments:         Anesthesia Quick Evaluation

## 2020-08-19 NOTE — H&P (Signed)
Primary Care Physician:  Kathyrn Drown, MD Primary Gastroenterologist:  Dr. Abbey Chatters  Pre-Procedure History & Physical: HPI:  Jeffrey [name] is a 50 y.o. male is here for an EGD for GERD, epigastric pain, abdominal bloating.  Has a history of pyloric stenosis status post dilation in 2015.  Currently taking omeprazole 20 mg twice daily. Past Medical History:  Diagnosis Date   COPD (chronic obstructive pulmonary disease) (HCC)    GERD (gastroesophageal reflux disease)     Past Surgical History:  Procedure Laterality Date   cardiac stress test     CHOLECYSTECTOMY     COLONOSCOPY N/A 09/19/2013   Procedure: COLONOSCOPY;  Surgeon: [name], MD;  Location: [facility];  Service: Endoscopy;  normal TI, 1 colon polyp removed but not retrieved, mild sigmoid diverticulosis with associated luminal narrowing, moderate sized external hemorrhoids.  Recommended repeat colonoscopy in 5 years.    COLONOSCOPY  2011   Dr. 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  Gastric biopsies with reactive/regenerative changes and focal intestinal metaplasia with no H. pylori, dysplasia, or malignancy.   ESOPHAGOGASTRODUODENOSCOPY ENDOSCOPY     MALONEY DILATION N/A 09/19/2013   Procedure: Venia Minks DILATION;  Surgeon: [name], MD;  Location: [facility];  Service: Endoscopy;  Laterality: N/A;   POLYPECTOMY  01/09/2020   Procedure: POLYPECTOMY;  Surgeon: Eloise Harman, DO;  Location: [facility];  Service: Endoscopy;;    SAVORY DILATION N/A 09/19/2013   Procedure: SAVORY DILATION;  Surgeon: [name], MD;  Location: [facility];  Service: Endoscopy;  Laterality: N/A;    Prior to Admission medications   Medication Sig Start Date End Date Taking? Authorizing Provider  albuterol (VENTOLIN HFA) 108 (90 Base) MCG/ACT inhaler INHALE 2 PUFFS BY MOUTH EVERY 4 HOURS AS NEEDED FOR WHEEZING FOR SHORTNESS OF BREATH Patient taking differently: Inhale 2 puffs into the lungs every 4 (four) hours as needed for wheezing or shortness of breath. 07/08/20  Yes Kathyrn Drown, MD  Ascorbic Acid (VITAMIN C) 1000 MG tablet Take 1,000 mg by mouth in the morning.   Yes [provider]  gabapentin (NEURONTIN) 100 MG capsule Take 1 capsule (100 mg total) by mouth 3 (three) times daily. Patient taking differently: Take 100 mg by mouth at bedtime. 02/27/20  Yes Kathyrn Drown, MD  loratadine (CLARITIN) 10 MG tablet Take 10 mg by mouth daily as needed for allergies.   Yes [provider]  pantoprazole (PROTONIX) 40 MG tablet Take 40 mg by mouth in the morning and at bedtime. 08/05/20  Yes [provider]  predniSONE (DELTASONE) 20 MG tablet 3 po qd x 3 d then 2 po qd x 3 d then 1 po qd x 2 d 08/16/20  Yes Nilda Simmer, NP  tadalafil (CIALIS) 20 MG tablet Take one tablet po 1-2 hours before relations. No more than one per day. Patient taking differently: Take 20 mg by mouth daily as needed for erectile dysfunction. Take one tablet po 1-2 hours before relations. No more than one per day. 04/12/20  Yes Luking,  Elayne Snare, MD  meclizine (ANTIVERT) 25 MG tablet Take 1 tablet (25 mg total) by mouth 3 (three) times daily as needed for dizziness. 03/15/20   Erven Colla, DO  metoprolol tartrate (LOPRESSOR) 100 MG tablet Take 100 mg 1-2 hours prior to your CT scan. 03/29/20   Josue Hector, MD  omeprazole (PRILOSEC) 20 MG capsule Take 1 capsule (20 mg total) by mouth 2 (two) times daily before a meal. 07/17/20   Mahala Menghini, PA-C  sucralfate (CARAFATE) 1 g tablet Take 1 tablet (1 g total) by mouth 4 (four) times daily -  with meals and at bedtime. 03/15/20   Erven Colla, DO    Allergies as of 07/17/2020   (No Known Allergies)    Family History  Problem Relation Age of Onset   Colon cancer Neg Hx    Colon polyps Neg Hx     Social History   Socioeconomic History   Marital status: Divorced    Spouse name: Not on file   Number of children: Not on file   Years of education: Not on file   Highest education level: Not on file  Occupational History   Not on file  Tobacco Use   Smoking status: Former    Pack years: 0.00   Smokeless tobacco: Current  Vaping Use   Vaping Use: Never used  Substance and Sexual Activity   Alcohol use: Yes    Comment: 18 pack per week   Drug use: Yes    Types: Marijuana    Comment: occas   Sexual activity: Not on file  Other Topics Concern   Not on file  Social History Narrative   OWNS [place]. RACES CARS WITH NITRO. STEP DAUGHTER RACES AS WELL.   Social Determinants of Health   Financial Resource Strain: Not on file  Food Insecurity: Not on file  Transportation Needs: Not on file  Physical Activity: Not on file  Stress: Not on file  Social Connections: Not on file  Intimate Partner Violence: Not on file    Review of Systems: See HPI, otherwise negative ROS  Physical Exam: Vital signs in last 24 hours: Temp:  [97.7 F (36.5 C)] 97.7 F (36.5 C) (06/13 0917) Pulse Rate:  [75] 75 (06/13 0917) Resp:  [20] 20 (06/13 0917) BP: (121)/(82) 121/82 (06/13 0917) SpO2:  [98 %] 98 % (06/13 0917) Weight:  [120.2 kg] 120.2 kg (06/13 0917)   General:   Alert,  Well-developed, well-nourished, pleasant and cooperative in NAD Head:  Normocephalic and atraumatic. Eyes:  Sclera clear, no icterus.   Conjunctiva pink. Ears:  Normal auditory acuity. Nose:  No deformity, discharge,  or lesions. Mouth:  No deformity or lesions, dentition  normal. Neck:  Supple; no masses or thyromegaly. Lungs:  Clear throughout to auscultation.   No wheezes, crackles, or rhonchi. No acute distress. Heart:  Regular rate and rhythm; no murmurs, clicks, rubs,  or gallops. Abdomen:  Soft, nontender and nondistended. No masses, hepatosplenomegaly or hernias noted. Normal bowel sounds, without guarding, and without rebound.   Msk:  Symmetrical without gross deformities. Normal posture. Extremities:  Without clubbing or edema. Neurologic:  Alert and  oriented x4;  grossly normal neurologically. Skin:  Intact without significant lesions or rashes. Cervical Nodes:  No significant cervical adenopathy. Psych:  Alert and cooperative. Normal mood and affect.  Impression/Plan: Jeffrey [name] is here for an EGD for GERD, epigastric pain, abdominal bloating.  Has a history of pyloric  stenosis status post dilation in 2015.  Currently taking omeprazole 20 mg twice daily.  The risks of the procedure including infection, bleed, or perforation as well as benefits, limitations, alternatives and imponderables have been reviewed with the patient. Questions have been answered. All parties agreeable.

## 2020-08-19 NOTE — Op Note (Signed)
[facility] Patient Name: Jeffrey [name] Procedure Date: 08/19/2020 9:53 AM MRN: [id number] Date of Birth: 02/20/1971 Attending MD: Elon Alas. Abbey Chatters DO CSN: [id number] Age: 50 Admit Type: Outpatient Procedure:                Upper GI endoscopy Indications:              Heartburn, Abdominal bloating Providers:                Elon Alas. Abbey Chatters, DO, Calera Page, [place]                            Risa Grill, Technician Referring MD:              Medicines:                See the Anesthesia note for documentation of the                            administered medications Complications:            No immediate complications. Estimated Blood Loss:     Estimated blood loss was minimal. Procedure:                Pre-Anesthesia Assessment:                           - The anesthesia plan was to use monitored                            anesthesia care (MAC).                           After obtaining informed consent, the endoscope was                            passed under direct vision. Throughout the                            procedure, the patient's blood pressure, pulse, and                            oxygen saturations were monitored continuously. The                            GIF-H190 ([id number]) was introduced through the                            mouth, and advanced to the second part of duodenum.                            The upper GI endoscopy was accomplished without                            difficulty. The patient tolerated the procedure                            well. Scope In: 10:07:30 AM  Scope Out: 10:19:25 AM Total Procedure Duration: 0 hours 11 minutes 55 seconds  Findings:      There is no endoscopic evidence of bleeding, areas of erosion,       esophagitis, ulcerations or varices in the entire esophagus.      A single 20-25 mm pedunculated polyp with no bleeding and no stigmata of       recent bleeding was found on the lesser curvature of the stomach.  The       polyp was removed with a hot snare. Resection and retrieval were       complete with a Roth net.      A benign-appearing, intrinsic mild stenosis was found at the pylorus.       This was traversed. A TTS dilator was passed through the scope. Dilation       with a 13.5 mm pyloric balloon dilator was performed. The dilation site       was examined and showed mild mucosal disruption and moderate improvement       in luminal narrowing.      The duodenal bulb, first portion of the duodenum and second portion of       the duodenum were normal. Impression:               - A single gastric polyp. Resected and retrieved.                           - Gastric stenosis was found at the pylorus.                            Dilated.                           - Normal duodenal bulb, first portion of the                            duodenum and second portion of the duodenum. Moderate Sedation:      Per Anesthesia Care Recommendation:           - Patient has a contact number available for                            emergencies. The signs and symptoms of potential                            delayed complications were discussed with the                            patient. Return to normal activities tomorrow.                            Written discharge instructions were provided to the                            patient.                           - Resume previous diet.                           -  Continue present medications.                           - Return to GI clinic in 3 months. Procedure Code(s):        --- Professional ---                           [phone], Esophagogastroduodenoscopy, flexible,                            transoral; with removal of tumor(s), polyp(s), or                            other lesion(s) by snare technique                           [id number], Esophagogastroduodenoscopy, flexible,                            transoral; with dilation of gastric/duodenal                             stricture(s) (eg, balloon, bougie) Diagnosis Code(s):        --- Professional ---                           K31.7, Polyp of stomach and duodenum                           K31.1, Adult hypertrophic pyloric stenosis                           R12, Heartburn                           R14.0, Abdominal distension (gaseous) CPT copyright 2019 American Medical Association. All rights reserved. The codes documented in this report are preliminary and upon coder review may  be revised to meet current compliance requirements. Elon Alas. Abbey Chatters, DO [name], DO 08/19/2020 10:28:38 AM This report has been signed electronically. Number of Addenda: 0

## 2020-08-21 ENCOUNTER — Telehealth: Payer: Self-pay

## 2020-08-21 ENCOUNTER — Other Ambulatory Visit: Payer: Self-pay | Admitting: *Deleted

## 2020-08-21 LAB — SURGICAL PATHOLOGY

## 2020-08-21 MED ORDER — COLCHICINE 0.6 MG PO TABS: 0.6000 mg | ORAL_TABLET | Freq: Every day | ORAL | 2 refills | Status: DC

## 2020-08-21 MED ORDER — COLCHICINE 0.6 MG PO TABS: 0.6000 mg | ORAL_TABLET | Freq: Two times a day (BID) | ORAL | 2 refills | Status: DC

## 2020-08-21 NOTE — Telephone Encounter (Signed)
Pt states foot is not worse but not getting any better. Can't walk on it. Swollen til where he cant put on boot. Seen carolyn last Friday. Has two prednisone left. Pt wonders if it is gout since uric acid.

## 2020-08-21 NOTE — Telephone Encounter (Signed)
Pt has two prednisone pills left and foot is completley swollen can not put pressure on it and can not get his work boot on it.   Pt call back [phone]

## 2020-08-21 NOTE — Telephone Encounter (Signed)
There is not a simple answer Gout can still occur even if uric acid level is within normal range I can work him into the schedule tomorrow at 430-there is possibility that there could be some wait involved. (Unfortunately today if he needs intervention it would be emergency department or urgent care)

## 2020-08-21 NOTE — Telephone Encounter (Signed)
Discussed with pt. Pt scheduled for tomorrow at 4:30

## 2020-08-22 ENCOUNTER — Ambulatory Visit (INDEPENDENT_AMBULATORY_CARE_PROVIDER_SITE_OTHER): Payer: BC Managed Care – PPO | Admitting: Family Medicine

## 2020-08-22 ENCOUNTER — Other Ambulatory Visit: Payer: Self-pay

## 2020-08-22 VITALS — BP 132/90 | HR 88 | Temp 98.2°F | Ht 72.0 in | Wt 272.0 lb

## 2020-08-22 DIAGNOSIS — M10072 Idiopathic gout, left ankle and foot: Secondary | ICD-10-CM | POA: Diagnosis not present

## 2020-08-22 MED ORDER — INDOMETHACIN 50 MG PO CAPS: 50.0000 mg | ORAL_CAPSULE | Freq: Three times a day (TID) | ORAL | 1 refills | Status: DC

## 2020-08-22 NOTE — Progress Notes (Signed)
   Subjective:    Patient ID: Jeffrey [name], male    DOB: 09-17-1970, 50 y.o.   MRN: [id number]  HPI  Follow up left foot swelling  Patient with pain discomfort in the MTP joint along with redness and swelling.  Previously seen several days ago treated for gout was on prednisone seem to help a little bit but not a lot ongoing pain discomfort and swelling.  Never had this before.  Does admit to eating shrimp before this happened.  Does drink beers intermittently. Review of Systems     Objective:   Physical Exam  Calf nontender some swelling in the midfoot and significant swelling in the distal foot with redness over the MTP joint consistent with gout.  Tender over that area      Assessment & Plan:  Acute gout Continue colchicine which was prescribed the other day twice daily Stop prednisone Indomethacin 3 times daily until relief If ongoing troubles notify us Give [place] update next week If frequent attacks will consider uric acid suppressor such as allopurinol Doubt cellulitis

## 2020-08-22 NOTE — Patient Instructions (Signed)
Low-Purine Eating Plan A low-purine eating plan involves making food choices to limit your intake of purine. Purine is a kind of uric acid. Too much uric acid in your blood can cause certain conditions, such as gout and kidney stones. Eating a low-purinediet can help control these conditions. What are tips for following this plan? Reading food labels Avoid foods with saturated or Trans fat. Check the ingredient list of grains-based foods, such as bread and cereal, to make sure that they contain whole grains. Check the ingredient list of sauces or soups to make sure they do not contain meat or fish. When choosing soft drinks, check the ingredient list to make sure they do not contain high-fructose corn syrup. Shopping  Buy plenty of fresh fruits and vegetables. Avoid buying canned or fresh fish. Buy dairy products labeled as low-fat or nonfat. Avoid buying premade or processed foods. These foods are often high in fat, salt (sodium), and added sugar.  Cooking Use olive oil instead of butter when cooking. Oils like olive oil, canola oil, and sunflower oil contain healthy fats. Meal planning Learn which foods do or do not affect you. If you find out that a food tends to cause your gout symptoms to flare up, avoid eating that food. You can enjoy foods that do not cause problems. If you have any questions about a food item, talk with your dietitian or health care provider. Limit foods high in fat, especially saturated fat. Fat makes it harder for your body to get rid of uric acid. Choose foods that are lower in fat and are lean sources of protein. General guidelines Limit alcohol intake to no more than 1 drink a day for nonpregnant women and 2 drinks a day for men. One drink equals 12 oz of beer, 5 oz of wine, or 1 oz of hard liquor. Alcohol can affect the way your body gets rid of uric acid. Drink plenty of water to keep your urine clear or pale yellow. Fluids can help remove uric acid from your  body. If directed by your health care provider, take a vitamin C supplement. Work with your health care provider and dietitian to develop a plan to achieve or maintain a healthy weight. Losing weight can help reduce uric acid in your blood. What foods are recommended? The items listed may not be a complete list. Talk with your dietitian aboutwhat dietary choices are best for you. Foods low in purines Foods low in purines do not need to be limited. These include: All fruits. All low-purine vegetables, pickles, and olives. Breads, pasta, rice, cornbread, and popcorn. Cake and other baked goods. All dairy foods. Eggs, nuts, and nut butters. Spices and condiments, such as salt, herbs, and vinegar. Plant oils, butter, and margarine. Water, sugar-free soft drinks, tea, coffee, and cocoa. Vegetable-based soups, broths, sauces, and gravies. Foods moderate in purines Foods moderate in purines should be limited to the amounts listed.  cup of asparagus, cauliflower, spinach, mushrooms, or green peas, each day. 2/3 cup uncooked oatmeal, each day.  cup dry wheat bran or wheat germ, each day. 2-3 ounces of meat or poultry, each day. 4-6 ounces of shellfish, such as crab, lobster, oysters, or shrimp, each day. 1 cup cooked beans, peas, or lentils, each day. Soup, broths, or bouillon made from meat or fish. Limit these foods as much as possible. What foods are not recommended? The items listed may not be a complete list. Talk with your dietitian aboutwhat dietary choices are best for you.   Limit your intake of foods high in purines, including: Beer and other alcohol. Meat-based gravy or sauce. Canned or fresh fish, such as: Anchovies, sardines, herring, and tuna. Mussels and scallops. Codfish, trout, and haddock. Bacon. Organ meats, such as: Liver or kidney. Tripe. Sweetbreads (thymus gland or pancreas). Wild game or goose. Yeast or yeast extract supplements. Drinks sweetened with  high-fructose corn syrup. Summary Eating a low-purine diet can help control conditions caused by too much uric acid in the body, such as gout or kidney stones. Choose low-purine foods, limit alcohol, and limit foods high in fat. You will learn over time which foods do or do not affect you. If you find out that a food tends to cause your gout symptoms to flare up, avoid eating that food. This information is not intended to replace advice given to you by your health care provider. Make sure you discuss any questions you have with your healthcare provider. Document Revised: 06/08/2019 Document Reviewed: 06/08/2019 Elsevier Patient Education  2022 Elsevier Inc.  

## 2020-08-26 ENCOUNTER — Encounter (HOSPITAL_COMMUNITY): Payer: Self-pay | Admitting: Internal Medicine

## 2020-08-29 ENCOUNTER — Encounter: Payer: Self-pay | Admitting: *Deleted

## 2020-09-10 ENCOUNTER — Other Ambulatory Visit: Payer: Self-pay

## 2020-09-10 ENCOUNTER — Emergency Department (HOSPITAL_COMMUNITY)
Admission: EM | Admit: 2020-09-10 | Discharge: 2020-09-10 | Disposition: A | Discharge status: Home / Self Care | Payer: BC Managed Care – PPO | Attending: Emergency Medicine | Admitting: Emergency Medicine

## 2020-09-10 ENCOUNTER — Emergency Department (HOSPITAL_COMMUNITY): Payer: BC Managed Care – PPO

## 2020-09-10 ENCOUNTER — Encounter (HOSPITAL_COMMUNITY): Payer: Self-pay

## 2020-09-10 DIAGNOSIS — R61 Generalized hyperhidrosis: Secondary | ICD-10-CM | POA: Insufficient documentation

## 2020-09-10 DIAGNOSIS — K219 Gastro-esophageal reflux disease without esophagitis: Secondary | ICD-10-CM | POA: Diagnosis not present

## 2020-09-10 DIAGNOSIS — Z87891 Personal history of nicotine dependence: Secondary | ICD-10-CM | POA: Insufficient documentation

## 2020-09-10 DIAGNOSIS — R42 Dizziness and giddiness: Secondary | ICD-10-CM | POA: Insufficient documentation

## 2020-09-10 DIAGNOSIS — J449 Chronic obstructive pulmonary disease, unspecified: Secondary | ICD-10-CM | POA: Diagnosis not present

## 2020-09-10 DIAGNOSIS — R1013 Epigastric pain: Secondary | ICD-10-CM | POA: Diagnosis not present

## 2020-09-10 DIAGNOSIS — R079 Chest pain, unspecified: Secondary | ICD-10-CM | POA: Diagnosis not present

## 2020-09-10 LAB — COMPREHENSIVE METABOLIC PANEL
ALT: 39 U/L (ref 0–44)
AST: 25 U/L (ref 15–41)
Albumin: 4.4 g/dL (ref 3.5–5.0)
Alkaline Phosphatase: 65 U/L (ref 38–126)
Anion gap: 11 (ref 5–15)
BUN: 14 mg/dL (ref 6–20)
CO2: 25 mmol/L (ref 22–32)
Calcium: 9.1 mg/dL (ref 8.9–10.3)
Chloride: 101 mmol/L (ref 98–111)
Creatinine, Ser: 0.9 mg/dL (ref 0.61–1.24)
GFR, Estimated: >60 mL/min (ref >60)
Glucose, Bld: 115 mg/dL — ABNORMAL HIGH (ref 70–99)
Potassium: 3.7 mmol/L (ref 3.5–5.1)
Sodium: 137 mmol/L (ref 135–145)
Total Bilirubin: 0.5 mg/dL (ref 0.3–1.2)
Total Protein: 7.7 g/dL (ref 6.5–8.1)

## 2020-09-10 LAB — CBC WITH DIFFERENTIAL/PLATELET
Abs Immature Granulocytes: 0.02 10*3/uL (ref 0.00–0.07)
Basophils Absolute: 0 10*3/uL (ref 0.0–0.1)
Basophils Relative: 0 %
Eosinophils Absolute: 0 10*3/uL (ref 0.0–0.5)
Eosinophils Relative: 1 %
HCT: 43.3 % (ref 39.0–52.0)
Hemoglobin: 14.8 g/dL (ref 13.0–17.0)
Immature Granulocytes: 1 %
Lymphocytes Relative: 34 %
Lymphs Abs: 1.3 10*3/uL (ref 0.7–4.0)
MCH: 31.9 pg (ref 26.0–34.0)
MCHC: 34.2 g/dL (ref 30.0–36.0)
MCV: 93.3 fL (ref 80.0–100.0)
Monocytes Absolute: 0.5 10*3/uL (ref 0.1–1.0)
Monocytes Relative: 13 %
Neutro Abs: 2 10*3/uL (ref 1.7–7.7)
Neutrophils Relative %: 51 %
Platelets: 154 10*3/uL (ref 150–400)
RBC: 4.64 MIL/uL (ref 4.22–5.81)
RDW: 12.4 % (ref 11.5–15.5)
WBC: 3.9 10*3/uL — ABNORMAL LOW (ref 4.0–10.5)
nRBC: 0 % (ref 0.0–0.2)

## 2020-09-10 LAB — TROPONIN I (HIGH SENSITIVITY)
Troponin I (High Sensitivity): <2 ng/L (ref <18)
Troponin I (High Sensitivity): 2 ng/L (ref <18)

## 2020-09-10 LAB — LIPASE, BLOOD: Lipase: 37 U/L (ref 11–51)

## 2020-09-10 MED ORDER — ALUM & MAG HYDROXIDE-SIMETH 200-200-20 MG/5ML PO SUSP
30.0000 mL | Freq: Once | ORAL | Status: AC
  Administered 2020-09-10: 30 mL via ORAL
  Filled 2020-09-10: qty 30

## 2020-09-10 MED ORDER — LIDOCAINE VISCOUS HCL 2 % MT SOLN
15.0000 mL | Freq: Once | OROMUCOSAL | Status: AC
  Administered 2020-09-10: 15 mL via ORAL
  Filled 2020-09-10: qty 15

## 2020-09-10 MED ORDER — SODIUM CHLORIDE 0.9 % IV BOLUS
1000.0000 mL | Freq: Once | INTRAVENOUS | Status: AC
  Administered 2020-09-10: 1000 mL via INTRAVENOUS

## 2020-09-10 MED ORDER — SUCRALFATE 1 GM/10ML PO SUSP: 1.0000 g | Freq: Three times a day (TID) | ORAL | 0 refills | Status: DC

## 2020-09-10 NOTE — Discharge Instructions (Addendum)
You were seen in the emergency department today for abdominal pain/ chest pain. Your work-up in the emergency department has been overall reassuring. Your labs have been fairly normal and or similar to previous blood work you have had done, your white blood cell count was mildly low- please have this rechecked by primary care. Your EKG and the enzyme we use to check your heart did not show an acute heart attack at this time. Your chest x-ray was normal.   We are sending you home with Carafate to take prior to meals prior to bedtime to see if this helps with discomfort.  We have prescribed you new medication(s) today. Discuss the medications prescribed today with your pharmacist as they can have adverse effects and interactions with your other medicines including over the counter and prescribed medications. Seek medical evaluation if you start to experience new or abnormal symptoms after taking one of these medicines, seek care immediately if you start to experience difficulty breathing, feeling of your throat closing, facial swelling, or rash as these could be indications of a more serious allergic reaction   We would like you to follow up closely with your primary care provider as well as your cardiology & GI doctors within 1-3 days. Return to the ER immediately should you experience any new or worsening symptoms including but not limited to return of pain, worsened pain, vomiting, shortness of breath, dizziness, lightheadedness, passing out, blood in your stool or vomit, or any other concerns that you may have.

## 2020-09-10 NOTE — ED Triage Notes (Signed)
Pt presents to ED with complaints of epigastric pain, dizziness, and diaphoresis started 30 min ago.

## 2020-09-10 NOTE — ED Provider Notes (Signed)
[facility] EMERGENCY DEPARTMENT Provider Note   CSN: [id number] Arrival date & time: 09/10/20  9983     History Chief Complaint  Patient presents with   Abdominal Pain    Jeffrey [name] is a 50 y.o. male with a hx of COPD, GERD, & prior cholecystectomy who presents to the ED with complaints of abdominal pain that began 30 minutes PTA.  Patient states the pain is located in the epigastrium, radiates up into the chest, epigastrium feels tight and chest has a burning sensation, developed associated diaphoresis and lightheadedness as if he may pass out with the discomfort prompting emergency department visit.  Current pain is a 5 out of 10 in severity.  No specific alleviating or aggravating factors, no change with exertion or deep breath.  He has had similar pain without the lightheadedness/diaphoresis previously, states it occurs every couple of months but usually eventually goes away.  He denies nausea, vomiting, syncope, dyspnea, or leg pain/swelling.  Patient did eat honey come for breakfast this morning.  Patient also mentions that last month he did have an upper endoscopy procedure, he did require some dilation.  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   HEMATOCHEZIA 01/27/2010   DYSPEPSIA 12/19/2009   GERD 10/17/2009   THORACIC DISC DISPLACEMENT 10/25/2008   SHOULDER PAIN 09/05/2008   IMPINGEMENT SYNDROME 09/05/2008   BEER DRINKER 05/30/2008   Generalized abdominal pain 05/30/2008   EPIGASTRIC PAIN 05/30/2008    Past Surgical History:  Procedure Laterality Date   BALLOON DILATION N/A  08/19/2020   Procedure: BALLOON DILATION;  Surgeon: Eloise Harman, DO;  Location: [facility];  Service: Endoscopy;  Laterality: N/A;   BIOPSY  08/19/2020   Procedure: BIOPSY;  Surgeon: Eloise Harman, DO;  Location: [facility];  Service: Endoscopy;;   cardiac stress test     CHOLECYSTECTOMY     COLONOSCOPY N/A 09/19/2013   Procedure: COLONOSCOPY;  Surgeon: [name], MD;  Location: [facility];  Service: Endoscopy;  normal TI, 1 colon polyp removed but not retrieved, mild sigmoid diverticulosis with associated luminal narrowing, moderate sized external hemorrhoids.  Recommended repeat colonoscopy in 5 years.    COLONOSCOPY  2011   Dr. 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  Gastric biopsies with reactive/regenerative changes and focal intestinal metaplasia with no H. pylori, dysplasia, or malignancy.   ESOPHAGOGASTRODUODENOSCOPY (EGD) WITH PROPOFOL N/A 08/19/2020   Procedure: ESOPHAGOGASTRODUODENOSCOPY (EGD) WITH PROPOFOL;  Surgeon: Eloise Harman, DO;  [facility];  Service: Endoscopy;  Laterality: N/A;  10:45am   ESOPHAGOGASTRODUODENOSCOPY ENDOSCOPY     MALONEY DILATION N/A 09/19/2013   Procedure: MALONEY DILATION;  Surgeon: [name],  MD;  Location: [facility];  Service: Endoscopy;  Laterality: N/A;   POLYPECTOMY  01/09/2020   Procedure: POLYPECTOMY;  Surgeon: Eloise Harman, DO;  [facility]: [facility];  Service: Endoscopy;;   POLYPECTOMY  08/19/2020   Procedure: POLYPECTOMY;   Surgeon: Eloise Harman, DO;  [facility];  Service: Endoscopy;;   SAVORY DILATION N/A 09/19/2013   Procedure: SAVORY DILATION;  Surgeon: [name], MD;  [facility];  Service: Endoscopy;  Laterality: N/A;       Family History  Problem Relation Age of Onset   Colon cancer Neg Hx    Colon polyps Neg Hx     Social History   Tobacco Use   Smoking status: Former    Pack years: 0.00   Smokeless tobacco: Current  Vaping Use   Vaping Use: Never used  Substance Use Topics   Alcohol use: Yes    Comment: 18 pack per week   Drug use: Yes    Types: Marijuana    Comment: occas    Home Medications Prior to Admission medications   Medication Sig Start Date End Date Taking? Authorizing Provider  albuterol (VENTOLIN HFA) 108 (90 Base) MCG/ACT inhaler INHALE 2 PUFFS BY MOUTH EVERY 4 HOURS AS NEEDED FOR WHEEZING FOR SHORTNESS OF BREATH Patient taking differently: Inhale 2 puffs into the lungs every 4 (four) hours as needed for wheezing or shortness of breath. 07/08/20   Kathyrn Drown, MD  Ascorbic Acid (VITAMIN C) 1000 MG tablet Take 1,000 mg by mouth in the morning.    [provider]  colchicine 0.6 MG tablet Take 1 tablet (0.6 mg total) by mouth 2 (two) times daily. 08/21/20   Kathyrn Drown, MD  gabapentin (NEURONTIN) 100 MG capsule Take 1 capsule (100 mg total) by mouth 3 (three) times daily. Patient taking differently: Take 100 mg by mouth at bedtime. 02/27/20   Kathyrn Drown, MD  indomethacin (INDOCIN) 50 MG capsule Take 1 capsule (50 mg total) by mouth 3 (three) times daily with meals. 08/22/20   Kathyrn Drown, MD  loratadine (CLARITIN) 10 MG tablet Take 10 mg by mouth daily as needed for allergies.    [provider]  meclizine (ANTIVERT) 25 MG tablet Take 1 tablet (25 mg total) by mouth 3 (three) times daily as needed for dizziness. 03/15/20   Erven Colla, DO  metoprolol tartrate (LOPRESSOR) 100 MG tablet Take 100 mg 1-2 hours  prior to your CT scan. 03/29/20   Josue Hector, MD  omeprazole (PRILOSEC) 20 MG capsule Take 1 capsule (20 mg total) by mouth 2 (two) times daily before a meal. 07/17/20   Mahala Menghini, PA-C  sucralfate (CARAFATE) 1 g tablet Take 1 tablet (1 g total) by mouth 4 (four) times daily -  with meals and at bedtime. 03/15/20   Elvia Collum M, DO  tadalafil (CIALIS) 20 MG tablet Take one tablet po 1-2 hours before relations. No more than one per day. Patient taking differently: Take 20 mg by mouth daily as needed for erectile dysfunction. Take one tablet po 1-2 hours before relations. No more than one per day. 04/12/20   Kathyrn Drown, MD    Allergies    Patient has no known allergies.  Review of Systems   Review of Systems  Constitutional:  Positive for diaphoresis. Negative for chills and fever.  Respiratory:  Negative for shortness of breath.   Cardiovascular:  Positive for chest  pain. Negative for leg swelling.  Gastrointestinal:  Positive for abdominal pain. Negative for blood in stool, nausea and vomiting.  Neurological:  Positive for light-headedness. Negative for syncope.  All other systems reviewed and are negative.  Physical Exam Updated Vital Signs BP (!) 144/106   Pulse 90   Temp 98.4 F (36.9 C) (Oral)   Resp 14   Ht 6' (1.829 m)   Wt 122 kg   SpO2 96%   BMI 36.48 kg/m   Physical Exam Vitals and nursing note reviewed.  Constitutional:      General: He is not in acute distress.    Appearance: He is well-developed. He is not toxic-appearing.  HENT:     Head: Normocephalic and atraumatic.  Eyes:     General:        Right eye: No discharge.        Left eye: No discharge.     Conjunctiva/sclera: Conjunctivae normal.  Cardiovascular:     Rate and Rhythm: Normal rate and regular rhythm.     Comments: 2+ symmetric radial pulses. Pulmonary:     Effort: Pulmonary effort is normal. No respiratory distress.     Breath sounds: Normal breath sounds. No wheezing, rhonchi or  rales.  Abdominal:     General: There is no distension.     Palpations: Abdomen is soft.     Tenderness: There is abdominal tenderness in the epigastric area. There is no guarding or rebound.  Musculoskeletal:     Cervical back: Neck supple.     Comments: No pitting lower extremity edema.  No calf tenderness.  Skin:    General: Skin is warm and dry.     Findings: No rash.  Neurological:     Mental Status: He is alert.     Comments: Clear speech.   Psychiatric:        Behavior: Behavior normal.    ED Results / Procedures / Treatments   Labs (all labs ordered are listed, but only abnormal results are displayed) Labs Reviewed  COMPREHENSIVE METABOLIC PANEL - Abnormal; Notable for the following components:      Result Value   Glucose, Bld 115 (*)    All other components within normal limits  CBC WITH DIFFERENTIAL/PLATELET - Abnormal; Notable for the following components:   WBC 3.9 (*)    All other components within normal limits  LIPASE, BLOOD  TROPONIN I (HIGH SENSITIVITY)  TROPONIN I (HIGH SENSITIVITY)    EKG EKG Interpretation  Date/Time:  Tuesday September 10 2020 10:04:12 EDT Ventricular Rate:  85 PR Interval:  172 QRS Duration: 92 QT Interval:  353 QTC Calculation: 420 R Axis:   57 Text Interpretation: Sinus rhythm Low voltage, precordial leads No acute changes No significant change since last tracing Confirmed by Varney Biles [id number]) on 09/10/2020 11:35:19 AM  Radiology DG Chest 2 View  Result Date: 09/10/2020 CLINICAL DATA:  Chest pain. EXAM: CHEST - 2 VIEW COMPARISON:  05/30/2014 FINDINGS: The cardiomediastinal silhouette is unchanged with normal heart size. There is minimal atelectasis or scarring in the left lung base. The right lung is clear. No pleural effusion or pneumothorax is identified. No acute osseous abnormality is seen. IMPRESSION: No active cardiopulmonary disease. Electronically Signed   By: Logan Bores M.D.   On: 09/10/2020 11:29     Procedures Procedures   Medications Ordered in ED Medications - No data to display  ED Course  I have reviewed the triage vital signs and the nursing notes.  Pertinent  labs & imaging results that were available during my care of the patient were reviewed by me and considered in my medical decision making (see chart for details).    MDM Rules/Calculators/A&P                         Patient presents to the ED with complaints of epigastric pain that began shortly PTA.  Patient is nontoxic, vitals are fairly unremarkable, blood pressure mildly elevated.  Mild epigastric abdominal tenderness on exam.  No peritoneal signs.  EKG: Sinus rhythm Low voltage, precordial leads No acute changes No significant change since last tracing  Additional history obtained:  Additional history obtained from chart review & nursing note review.   CT coronary 04/11/20:  IMPRESSION: 1. Coronary calcium score of 7.93. This was 21 percentile for age and sex matched control. 2. Normal coronary origin with right dominance. 3. Minimal, nonobstructive CAD; CAD RADS-1.  Upper endoscopy 08/19/2020: - A single gastric polyp.  Resected and retrieved. -Gastric stenosis was found at the pylorus.  Dilated. - Normal duodenal bulb, first portion of the duodenum and second portion of the duodenum.  Lab Tests:  I Ordered, reviewed, and interpreted labs, which included:  CBC: Mild leukopenia CMP: Unremarkable Lipase: Within normal limits Troponin: No significant elevation, flat  Imaging Studies ordered:  I ordered imaging studies which included CXR, I independently reviewed, formal radiology impression shows: No active cardiopulmonary disease.  ED Course:  14:15: RE-EVAL: On reassessment the patient is feeling improved status post GI cocktail.  Patient with minimal nonobstructive CAD on recent CT coronary study in February, his EKG is without acute ischemia, his troponins are flat, feel that ACS is less likely  at this time.  Patient is low risk [place], doubt pulmonary embolism.  Given pain in the epigastrium radiating up into the chest did consider dissection, however patient without significant hypertension, no history of tobacco use, no family history of Marfan's, symmetric pulses, no neurologic involvement, feel that this would be less likely.  Repeat abdominal exam remains without peritoneal signs, doubt acute surgical process in the abdomen.  Unclear definitive etiology, did recently have to have his esophagus stretched he did have some improvement following GI cocktail.  Will discharge home with instructions to follow-up with his specialist as well as his primary care provider. I discussed results, treatment plan, need for follow-up, and return precautions with the patient. Provided opportunity for questions, patient confirmed understanding and is in agreement with plan.    Findings and plan of care discussed with supervising physician Dr. Kathrynn Humble who has provided guidance & is in agreement.   Portions of this note were generated with [employer]. Dictation errors may occur despite best attempts at proofreading.  Final Clinical Impression(s) / ED Diagnoses Final diagnoses:  Epigastric pain    Rx / DC Orders ED Discharge Orders          Ordered    sucralfate (CARAFATE) 1 GM/10ML suspension  3 times daily with meals & bedtime        09/10/20 1436             Averie Hornbaker, [place] R, PA-C 09/10/20 1437    Varney Biles, MD 09/11/20 [phone]

## 2020-10-14 ENCOUNTER — Other Ambulatory Visit: Payer: Self-pay | Admitting: Family Medicine

## 2020-12-05 ENCOUNTER — Telehealth: Payer: Self-pay | Admitting: *Deleted

## 2020-12-05 ENCOUNTER — Ambulatory Visit (INDEPENDENT_AMBULATORY_CARE_PROVIDER_SITE_OTHER): Payer: BC Managed Care – PPO | Admitting: Family Medicine

## 2020-12-05 ENCOUNTER — Other Ambulatory Visit: Payer: Self-pay

## 2020-12-05 DIAGNOSIS — U071 COVID-19: Secondary | ICD-10-CM | POA: Diagnosis not present

## 2020-12-05 MED ORDER — NIRMATRELVIR/RITONAVIR (PAXLOVID)TABLET: 3.0000 | ORAL_TABLET | Freq: Two times a day (BID) | ORAL | 0 refills | Status: AC

## 2020-12-05 NOTE — Patient Instructions (Signed)

## 2020-12-05 NOTE — Telephone Encounter (Signed)
Mr. Jeffrey [name], vise are scheduled for a virtual visit with your provider today.    Just as we do with appointments in the office, we must obtain your consent to participate.  Your consent will be active for this visit and any virtual visit you may have with one of our providers in the next 365 days.    If you have a MyChart account, I can also send a copy of this consent to you electronically.  All virtual visits are billed to your insurance company just like a traditional visit in the office.  As this is a virtual visit, video technology does not allow for your provider to perform a traditional examination.  This may limit your provider's ability to fully assess your condition.  If your provider identifies any concerns that need to be evaluated in person or the need to arrange testing such as labs, EKG, etc, we will make arrangements to do so.    Although advances in technology are sophisticated, we cannot ensure that it will always work on either your end or our end.  If the connection with a video visit is poor, we may have to switch to a telephone visit.  With either a video or telephone visit, we are not always able to ensure that we have a secure connection.   I need to obtain your verbal consent now.   Are you willing to proceed with your visit today?   OBRIAN BULSON has provided verbal consent on 12/05/2020 for a virtual visit (video or telephone).

## 2020-12-05 NOTE — Progress Notes (Signed)
   Subjective:    Patient ID: JOSEL KEO, male    DOB: 11-17-1970, 50 y.o.   MRN: [id number] Initially was a telephone visit but was switched over to in person because of chest symptoms HPI  Patient calls to discuss Covid positive results. Patient stated with symptoms 1.5-2 days ago and had faint positive test last night and really positive test this am. No fever or SOB  Patient last evening states he developed some chest symptoms and felt like he was short of breath he does have a history of having some intermittent chest pains  Patient currently is at work we will bring him by the office to listen to  Virtual Visit via Telephone Note  I connected with Reece Packer on 12/05/20 at  3:10 PM EDT by telephone and verified that I am speaking with the correct person using two identifiers.  Location: Patient: home Provider: office   I discussed the limitations, risks, security and privacy concerns of performing an evaluation and management service by telephone and the availability of in person appointments. I also discussed with the patient that there may be a patient responsible charge related to this service. The patient expressed understanding and agreed to proceed.   History of Present Illness:    Observations/Objective:   Assessment and Plan:   Follow Up Instructions:    I discussed the assessment and treatment plan with the patient. The patient was provided an opportunity to ask questions and all were answered. The patient agreed with the plan and demonstrated an understanding of the instructions.   The patient was advised to call back or seek an in-person evaluation if the symptoms worsen or if the condition fails to improve as anticipated.   Review of Systems     Objective:   Physical Exam  General-in no acute distress Eyes-no discharge Lungs-respiratory rate normal, CTA CV-no murmurs,RRR Extremities skin warm dry no edema Neuro grossly normal Behavior  normal, alert       Assessment & Plan:   COVID Medication options discussed with patient Patient elects medication Paxlovid Side effects discussed warning signs discussed follow-up if ongoing troubles

## 2020-12-11 ENCOUNTER — Encounter: Payer: Self-pay | Admitting: Gastroenterology

## 2020-12-11 ENCOUNTER — Ambulatory Visit: Payer: BC Managed Care – PPO | Admitting: Gastroenterology

## 2021-02-10 ENCOUNTER — Other Ambulatory Visit: Payer: Self-pay

## 2021-02-10 ENCOUNTER — Ambulatory Visit: Payer: BC Managed Care – PPO | Admitting: Family Medicine

## 2021-02-10 ENCOUNTER — Encounter: Payer: Self-pay | Admitting: Family Medicine

## 2021-02-10 VITALS — BP 136/86 | HR 95 | Temp 98.1°F | Ht 72.0 in | Wt 276.0 lb

## 2021-02-10 DIAGNOSIS — J019 Acute sinusitis, unspecified: Secondary | ICD-10-CM

## 2021-02-10 DIAGNOSIS — J329 Chronic sinusitis, unspecified: Secondary | ICD-10-CM

## 2021-02-10 MED ORDER — AMOXICILLIN-POT CLAVULANATE 875-125 MG PO TABS: 1.0000 | ORAL_TABLET | Freq: Two times a day (BID) | ORAL | 0 refills | Status: DC

## 2021-02-10 MED ORDER — AZELASTINE HCL 0.1 % NA SOLN: 2.0000 | Freq: Two times a day (BID) | NASAL | 12 refills | Status: DC

## 2021-02-10 NOTE — Progress Notes (Signed)
   Subjective:    Patient ID: Jeffrey [name], male    DOB: 07-Mar-1971, 50 y.o.   MRN: [id number]  HPI Sinusitis x 1 month , pressure behind eyes, sinus congestion daily causing sore throat  Head congestion drainage sinus pressure denies wheezing difficulty breathing denies high fever chills sweats states his tics keeps going on and on causing significant sinus pressure Soreness in the top of the throat Sinus pressure  He does state when he does chest exercises or lifting of any object that is heavy he feels a sharp pain in his chest and sometimes in his back he has been seen by pain specialist been seen by cardiologist.  He has some ongoing troubles with this. Review of Systems     Objective:   Physical Exam Gen-NAD not toxic TMS-normal bilateral T- normal no redness Chest-CTA respiratory rate normal no crackles CV RRR no murmur Skin-warm dry Neuro-grossly normal        Assessment & Plan:   1. Acute rhinosinusitis Antibiotics Nasal spray Saling lavage  2. Chronic congestion of paranasal sinus See above  Sharp chest pains neg cardiac workup Physivcal med doc when pt is ready Consideration to see a physical medicine doctor if ongoing troubles was discussed with patient  Wellness in early 2023

## 2021-03-13 ENCOUNTER — Ambulatory Visit
Admission: RE | Admit: 2021-03-13 | Discharge: 2021-03-13 | Disposition: A | Discharge status: Home / Self Care | Payer: BC Managed Care – PPO | Source: Ambulatory Visit

## 2021-03-13 ENCOUNTER — Other Ambulatory Visit: Payer: Self-pay

## 2021-03-13 VITALS — BP 136/88 | HR 100 | Temp 98.8°F | Resp 18

## 2021-03-13 DIAGNOSIS — J309 Allergic rhinitis, unspecified: Secondary | ICD-10-CM

## 2021-03-13 MED ORDER — DEXAMETHASONE SODIUM PHOSPHATE 10 MG/ML IJ SOLN
10.0000 mg | Freq: Once | INTRAMUSCULAR | Status: AC
  Administered 2021-03-13: 10 mg via INTRAMUSCULAR

## 2021-03-13 MED ORDER — MONTELUKAST SODIUM 10 MG PO TABS: 10.0000 mg | ORAL_TABLET | Freq: Every day | ORAL | 2 refills | Status: DC

## 2021-03-13 NOTE — ED Provider Notes (Signed)
[facility]    CSN: [id number] Arrival date & time: 03/13/21  1339      History   Chief Complaint Chief Complaint  Patient presents with   Nasal Congestion   Sinus Pressure    HPI Jeffrey [name] is a 51 y.o. male.   Patient presenting today with about 2 weeks of sinus pressure, runny nose, nasal congestion worse in the mornings and overnight.  States he has a cough in the mornings until he clears the mucus and then seems to be okay throughout the day.  He tends to have these issues off and on, especially in the fall and was seen by his primary care provider last month for similar symptoms, given Astelin nasal spray which she is still using twice daily and an antibiotic which he did not take.  Also takes Claritin daily for known seasonal allergies.  Denies fever, chills, chest pain, shortness of breath, abdominal pain, nausea vomiting diarrhea, new sick contacts.   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   HEMATOCHEZIA 01/27/2010   DYSPEPSIA 12/19/2009   GERD 10/17/2009   THORACIC DISC DISPLACEMENT 10/25/2008   SHOULDER PAIN 09/05/2008   IMPINGEMENT SYNDROME 09/05/2008   BEER DRINKER 05/30/2008   Generalized abdominal pain 05/30/2008   EPIGASTRIC PAIN 05/30/2008    Past Surgical History:  Procedure Laterality Date   BALLOON DILATION N/A 08/19/2020   Procedure: BALLOON DILATION;  Surgeon: Eloise Harman, DO;  [facility];  Service: Endoscopy;  Laterality: N/A;   BIOPSY  08/19/2020   Procedure: BIOPSY;  Surgeon: Eloise Harman, DO;  [facility];  Service:  Endoscopy;;   cardiac stress test     CHOLECYSTECTOMY     COLONOSCOPY N/A 09/19/2013   Procedure: COLONOSCOPY;  Surgeon: [name], MD;  [facility];  Service: Endoscopy;  normal TI, 1 colon polyp removed but not retrieved, mild sigmoid diverticulosis with associated luminal narrowing, moderate sized external hemorrhoids.  Recommended repeat colonoscopy in 5 years.    COLONOSCOPY  2011   Dr. 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  Gastric biopsies with reactive/regenerative changes and focal intestinal metaplasia with no H. pylori, dysplasia, or malignancy.   ESOPHAGOGASTRODUODENOSCOPY (EGD) WITH PROPOFOL N/A 08/19/2020   Procedure: ESOPHAGOGASTRODUODENOSCOPY (EGD) WITH PROPOFOL;  Surgeon: Eloise Harman, DO;  [facility];  Service: Endoscopy;  Laterality: N/A;  10:45am   ESOPHAGOGASTRODUODENOSCOPY ENDOSCOPY     MALONEY DILATION N/A 09/19/2013   Procedure: MALONEY DILATION;  Surgeon: [name], MD;  [facility];  Service: Endoscopy;  Laterality: N/A;   POLYPECTOMY  01/09/2020   Procedure: POLYPECTOMY;  Surgeon: Eloise Harman, DO;  [facility];  Service: Endoscopy;;   POLYPECTOMY  08/19/2020   Procedure: POLYPECTOMY;  Surgeon: Eloise Harman, DO;  [facility];  Service: Endoscopy;;   SAVORY DILATION N/A 09/19/2013   Procedure: SAVORY DILATION;  Surgeon: [name], MD;  [facility];  Service: Endoscopy;  Laterality: N/A;        Home Medications    Prior to Admission medications   Medication Sig Start Date End Date Taking? Authorizing Provider  montelukast (SINGULAIR) 10 MG tablet Take 1 tablet (10 mg total) by mouth at bedtime. 03/13/21  Yes Volney American, PA-C  albuterol (VENTOLIN HFA) 108 (90 Base) MCG/ACT inhaler INHALE 2 PUFFS BY MOUTH EVERY 4 HOURS AS NEEDED FOR WHEEZING FOR SHORTNESS OF BREATH Patient not taking: Reported on 02/10/2021 07/08/20   Kathyrn Drown, MD  amoxicillin-clavulanate (AUGMENTIN) 875-125 MG tablet Take 1 tablet by mouth 2 (two) times daily. 02/10/21   Kathyrn Drown, MD  Ascorbic Acid (VITAMIN C) 1000 MG tablet Take 1,000 mg by mouth in the morning.    [provider]  azelastine (ASTELIN) 0.1 % nasal spray Place 2 sprays into both nostrils 2 (two) times daily. 02/10/21   Kathyrn Drown, MD  colchicine 0.6 MG tablet Take 0.6 mg by mouth 2 (two) times daily. 09/29/20   [provider]  gabapentin (NEURONTIN) 100 MG capsule Take 1 capsule (100 mg total) by mouth 3 (three) times daily. 02/27/20   Kathyrn Drown, MD  indomethacin (INDOCIN) 50 MG capsule Take 1 capsule (50 mg total) by mouth 3 (three) times daily with meals. Patient not taking: Reported on 02/10/2021 08/22/20   Kathyrn Drown, MD  loratadine (CLARITIN) 10 MG tablet Take 10 mg by mouth daily as needed for allergies.    [provider]  pantoprazole (PROTONIX) 40 MG tablet Take 1 tablet by mouth once daily 10/14/20   Luking, Elayne Snare, MD  sucralfate (CARAFATE) 1 GM/10ML suspension Take 10 mLs (1 g total) by mouth 4 (four) times daily -  with meals and at bedtime. Patient not taking: Reported on 02/10/2021 09/10/20   Petrucelli, Glynda Jaeger, PA-C    Family History Family History  Problem Relation Age of Onset   Colon cancer Neg Hx    Colon polyps Neg Hx     Social History Social History   Tobacco Use   Smoking status: Former   Smokeless tobacco: Current    Types: Psychologist, forensic Use: Never used  Substance Use Topics   Alcohol use: Yes    Comment: 18 pack per week   Drug use: Yes    Types: Marijuana    Comment: occas     Allergies   Patient has no known allergies.   Review of Systems Review of Systems Per HPI  Physical Exam Triage Vital Signs ED Triage Vitals [03/13/21 1446]  Enc Vitals Group     BP 136/88     Pulse Rate 100     Resp 18     Temp 98.8 F (37.1 C)     Temp Source Oral     SpO2 95 %     Weight      Height      Head Circumference      Peak Flow      Pain Score      Pain Loc      Pain Edu?      Excl. in [place]?  No data found.  Updated Vital Signs BP 136/88 (BP Location: Right Arm)    Pulse 100    Temp 98.8 F (37.1 C) (Oral)    Resp 18    SpO2 95%   Visual Acuity Right Eye Distance:   Left Eye Distance:   Bilateral Distance:    Right Eye Near:   Left Eye Near:    Bilateral Near:     Physical Exam Vitals and nursing note reviewed.  Constitutional:      Appearance: He is well-developed.  HENT:     Head: Atraumatic.     Right Ear: Tympanic membrane and external ear normal.     Left Ear: Tympanic membrane and external ear normal.     Nose: Rhinorrhea present.     Mouth/Throat:     Pharynx: Posterior oropharyngeal erythema present. No oropharyngeal exudate.  Eyes:     Conjunctiva/sclera: Conjunctivae normal.     Pupils: Pupils are equal, round, and reactive to light.  Cardiovascular:     Rate and Rhythm: Normal rate and regular rhythm.  Pulmonary:     Effort: Pulmonary effort is normal. No respiratory distress.     Breath sounds: No wheezing or rales.  Musculoskeletal:        General: Normal range of motion.     Cervical back: Normal range of motion and neck supple.  Lymphadenopathy:     Cervical: No cervical adenopathy.  Skin:    General: Skin is warm and dry.  Neurological:     Mental Status: He is alert and oriented to person, place, and time.  Psychiatric:        Behavior: Behavior normal.      UC Treatments / Results  Labs (all labs ordered are listed, but only abnormal results are displayed) Labs Reviewed - No data to display  EKG   Radiology No results found.  Procedures Procedures (including critical care time)  Medications Ordered in UC Medications  dexamethasone (DECADRON) injection 10 mg (10 mg Intramuscular Given 03/13/21 1506)    Initial Impression / Assessment and Plan / UC Course  I have reviewed the triage vital signs and the nursing notes.  Pertinent labs & imaging results that were available during my care of the patient were reviewed by me and considered in my medical decision making (see chart for details).     Suspect poorly controlled allergic rhinitis, increase antihistamine to twice daily during symptomatic seasons and add Singulair, saline rinses.  Continue nasal spray, discussed over-the-counter supportive medications as needed.  IM Decadron given additionally to help with initial resolution of symptoms.  PCP follow-up recommended for recheck.  Final Clinical Impressions(s) / UC Diagnoses   Final diagnoses:  Allergic sinusitis   Discharge Instructions   None    ED Prescriptions     Medication Sig Dispense Auth. Provider   montelukast (SINGULAIR) 10 MG tablet Take 1 tablet (10 mg total) by mouth at bedtime. 30 tablet Volney American, [place]      PDMP not reviewed this encounter.   Volney American, [place] 03/13/21 1511

## 2021-03-13 NOTE — ED Triage Notes (Signed)
Pt presents with sinus pressure and runny nose x 2 weeks.

## 2021-04-17 ENCOUNTER — Other Ambulatory Visit: Payer: Self-pay

## 2021-04-17 MED ORDER — PANTOPRAZOLE SODIUM 40 MG PO TBEC: 40.0000 mg | DELAYED_RELEASE_TABLET | Freq: Every day | ORAL | 5 refills | Status: DC

## 2021-06-11 ENCOUNTER — Encounter: Payer: Self-pay | Admitting: Family Medicine

## 2021-06-11 ENCOUNTER — Ambulatory Visit: Payer: BC Managed Care – PPO | Admitting: Family Medicine

## 2021-06-11 VITALS — BP 119/83 | HR 89 | Temp 98.0°F | Ht 72.0 in | Wt 275.0 lb

## 2021-06-11 DIAGNOSIS — K219 Gastro-esophageal reflux disease without esophagitis: Secondary | ICD-10-CM

## 2021-06-11 DIAGNOSIS — M2550 Pain in unspecified joint: Secondary | ICD-10-CM

## 2021-06-11 MED ORDER — MELOXICAM 15 MG PO TABS: 15.0000 mg | ORAL_TABLET | Freq: Every day | ORAL | 0 refills | Status: DC | PRN

## 2021-06-11 MED ORDER — PANTOPRAZOLE SODIUM 40 MG PO TBEC: 40.0000 mg | DELAYED_RELEASE_TABLET | Freq: Every day | ORAL | 1 refills | Status: DC

## 2021-06-11 NOTE — Patient Instructions (Signed)
Labs today.  Medication as directed.  Take care  Dr. Jerzy Crotteau  

## 2021-06-12 DIAGNOSIS — M2550 Pain in unspecified joint: Secondary | ICD-10-CM | POA: Insufficient documentation

## 2021-06-12 LAB — URIC ACID: Uric Acid: 9.5 mg/dL — ABNORMAL HIGH (ref 3.8–8.4)

## 2021-06-12 LAB — CMP14+EGFR
ALT: 44 IU/L (ref 0–44)
AST: 31 IU/L (ref 0–40)
Albumin/Globulin Ratio: 1.7 (ref 1.2–2.2)
Albumin: 5 g/dL (ref 4.0–5.0)
Alkaline Phosphatase: 89 IU/L (ref 44–121)
BUN/Creatinine Ratio: 11 (ref 9–20)
BUN: 10 mg/dL (ref 6–24)
Bilirubin Total: 0.2 mg/dL (ref 0.0–1.2)
CO2: 20 mmol/L (ref 20–29)
Calcium: 9.5 mg/dL (ref 8.7–10.2)
Chloride: 100 mmol/L (ref 96–106)
Creatinine, Ser: 0.9 mg/dL (ref 0.76–1.27)
Globulin, Total: 2.9 g/dL (ref 1.5–4.5)
Glucose: 76 mg/dL (ref 70–99)
Potassium: 4.6 mmol/L (ref 3.5–5.2)
Sodium: 140 mmol/L (ref 134–144)
Total Protein: 7.9 g/dL (ref 6.0–8.5)
eGFR: 105 mL/min/{1.73_m2} (ref >59)

## 2021-06-12 LAB — CBC
Hematocrit: 44.1 % (ref 37.5–51.0)
Hemoglobin: 15.4 g/dL (ref 13.0–17.7)
MCH: 32 pg (ref 26.6–33.0)
MCHC: 34.9 g/dL (ref 31.5–35.7)
MCV: 92 fL (ref 79–97)
Platelets: 205 10*3/uL (ref 150–450)
RBC: 4.82 x10E6/uL (ref 4.14–5.80)
RDW: 13 % (ref 11.6–15.4)
WBC: 5.1 10*3/uL (ref 3.4–10.8)

## 2021-06-12 LAB — ANA W/REFLEX: Anti Nuclear Antibody (ANA): NEGATIVE

## 2021-06-12 LAB — RHEUMATOID FACTOR: Rheumatoid fact SerPl-aCnc: 10.9 IU/mL (ref <14.0)

## 2021-06-12 LAB — SEDIMENTATION RATE: Sed Rate: 14 mm/hr (ref 0–15)

## 2021-06-12 LAB — C-REACTIVE PROTEIN: CRP: 2 mg/L (ref 0–10)

## 2021-06-12 NOTE — Assessment & Plan Note (Signed)
Meloxicam as directed.  Additional work-up today with laboratory studies.  See orders. ?

## 2021-06-12 NOTE — Progress Notes (Signed)
? ?Subjective:  ?Patient ID: Jeffrey [name], male    DOB: [birth year]  Age: 51 y.o. MRN: [id number] ? ?CC: ?Chief Complaint  ?Patient presents with  ? right rib pain to mid back  ? pain in left shoulder   ?  Pain center of chest and back pain with certain arm movement  ? Gastroesophageal Reflux  ?  Resend pantoprazole 40 mg/ protonix  ? ? ?HPI: ? ?51 year old male presents with multiple complaints. ? ?Patient reports ongoing pain in the right rib area which is worse with certain ranges of motion.  He also has back pain.  As of the past week has developed pain in the wrist and shoulders.  Has a remote history of suspected gout and elevated uric acid levels.  No relieving factors.  No reports of joint swelling. ? ?Patient also notes that he needs a refill on his Protonix regarding GERD. ? ?Patient Active Problem List  ? Diagnosis Date Noted  ? Arthralgia 06/12/2021  ? H/O pyloric stenosis 07/17/2020  ? COPD (chronic obstructive pulmonary disease) ([place]) 12/18/2015  ? 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 GERD 10/17/2009  ? ? ?Social Hx   ?Social History  ? ?Socioeconomic History  ? Marital status: Divorced  ?  Spouse name: Not on file  ? Number of children: Not on file  ? Years of education: Not on file  ? Highest education level: Not on file  ?Occupational History  ? Not on file  ?Tobacco Use  ? Smoking status: Former  ? Smokeless tobacco: Current  ?  Types: Chew  ?Vaping Use  ? Vaping Use: Never used  ?Substance and Sexual Activity  ? Alcohol use: Yes  ?  Comment: 18 pack per week  ? Drug use: Yes  ?  Types: Marijuana  ?  Comment: occas  ? Sexual activity: Not on file  ?Other Topics Concern  ? Not on file  ?Social History Narrative  ? 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  ? ?Social Determinants of Health  ? ?Financial Resource Strain: Not on file  ?Food Insecurity: Not on file  ?Transportation Needs: Not on file  ?Physical Activity: Not on file  ?Stress: Not on file   ?Social Connections: Not on file  ? ? ?Review of Systems ?Per HPI ? ?Objective:  ?BP 119/83   Pulse 89   Temp 98 ?F (36.7 ?C)   Ht 6' (1.829 m)   Wt 275 lb (124.7 kg)   SpO2 95%   BMI 37.30 kg/m?  ? ? ?  06/11/2021  ?  2:03 PM 03/13/2021  ?  2:46 PM 02/10/2021  ?  3:42 PM  ?BP/Weight  ?Systolic BP 734 287 681  ?Diastolic BP 83 88 86  ?Wt. (Lbs) 275  276  ?BMI 37.3 kg/m2  37.43 kg/m2  ? ? ?Physical Exam ?Vitals and nursing note reviewed.  ?Constitutional:   ?   General: He is not in acute distress. ?   Appearance: Normal appearance. He is not ill-appearing.  ?HENT:  ?   Head: Normocephalic and atraumatic.  ?Cardiovascular:  ?   Rate and Rhythm: Normal rate and regular rhythm.  ?   Heart sounds: No murmur heard. ?Pulmonary:  ?   Effort: Pulmonary effort is normal.  ?   Breath sounds: Normal breath sounds. No wheezing, rhonchi or rales.  ?Musculoskeletal:  ?   Comments: No appreciable tenderness around the ribs, upper back, lower back on exam.  Good  range of motion of the wrists.  No appreciable synovitis/swelling.  ?Neurological:  ?   Mental Status: He is alert.  ?Psychiatric:     ?   Mood and Affect: Mood normal.     ?   Behavior: Behavior normal.  ? ? ?Lab Results  ?Component Value Date  ? WBC 5.1 06/11/2021  ? HGB 15.4 06/11/2021  ? HCT 44.1 06/11/2021  ? PLT 205 06/11/2021  ? GLUCOSE 76 06/11/2021  ? CHOL 275 (H) 03/27/2019  ? TRIG 414 (H) 03/27/2019  ? HDL 39 (L) 03/27/2019  ? LDLCALC 157 (H) 03/27/2019  ? ALT 44 06/11/2021  ? AST 31 06/11/2021  ? NA 140 06/11/2021  ? K 4.6 06/11/2021  ? CL 100 06/11/2021  ? CREATININE 0.90 06/11/2021  ? BUN 10 06/11/2021  ? CO2 20 06/11/2021  ? TSH 1.440 03/07/2019  ? ? ? ?Assessment & Plan:  ? ?Problem List Items Addressed This Visit   ? ?  ? Digestive  ? GERD  ?  Stable.  Protonix refilled. ?  ?  ? Relevant Medications  ? pantoprazole (PROTONIX) 40 MG tablet  ?  ? Other  ? Arthralgia - Primary  ?  Meloxicam as directed.  Additional work-up today with laboratory studies.  See  orders. ?  ?  ? Relevant Orders  ? CBC (Completed)  ? CMP14+EGFR (Completed)  ? Uric acid (Completed)  ? Sed Rate (ESR) (Completed)  ? C-reactive protein (Completed)  ? ANA w/Reflex (Completed)  ? Rheumatoid Factor (Completed)  ? Cyclic citrul peptide antibody, IgG  ? ? ?Meds ordered this encounter  ?Medications  ? pantoprazole (PROTONIX) 40 MG tablet  ?  Sig: Take 1 tablet (40 mg total) by mouth daily.  ?  Dispense:  90 tablet  ?  Refill:  1  ? meloxicam (MOBIC) 15 MG tablet  ?  Sig: Take 1 tablet (15 mg total) by mouth daily as needed for pain.  ?  Dispense:  30 tablet  ?  Refill:  0  ? ?Thersa Salt DO ?[place] ? ?

## 2021-06-12 NOTE — Assessment & Plan Note (Signed)
Stable. Protonix refilled.  

## 2021-06-13 ENCOUNTER — Other Ambulatory Visit: Payer: Self-pay | Admitting: Family Medicine

## 2021-06-13 MED ORDER — ALLOPURINOL 100 MG PO TABS: 100.0000 mg | ORAL_TABLET | Freq: Every day | ORAL | 3 refills | Status: DC

## 2021-06-13 MED ORDER — COLCHICINE 0.6 MG PO TABS: 0.6000 mg | ORAL_TABLET | Freq: Every day | ORAL | 1 refills | Status: DC

## 2021-06-27 ENCOUNTER — Other Ambulatory Visit: Payer: Self-pay | Admitting: Family Medicine

## 2021-07-04 ENCOUNTER — Ambulatory Visit (HOSPITAL_COMMUNITY)
Admission: RE | Admit: 2021-07-04 | Discharge: 2021-07-04 | Disposition: A | Discharge status: Home / Self Care | Payer: BC Managed Care – PPO | Source: Ambulatory Visit | Attending: Family Medicine | Admitting: Family Medicine

## 2021-07-04 ENCOUNTER — Ambulatory Visit: Payer: BC Managed Care – PPO | Admitting: Family Medicine

## 2021-07-04 VITALS — BP 125/92 | HR 83 | Temp 97.3°F | Ht 72.0 in | Wt 276.0 lb

## 2021-07-04 DIAGNOSIS — R1030 Lower abdominal pain, unspecified: Secondary | ICD-10-CM | POA: Diagnosis not present

## 2021-07-04 DIAGNOSIS — M545 Low back pain, unspecified: Secondary | ICD-10-CM | POA: Diagnosis not present

## 2021-07-04 LAB — POCT URINALYSIS DIP (MANUAL ENTRY)
Bilirubin, UA: NEGATIVE
Blood, UA: NEGATIVE
Glucose, UA: NEGATIVE mg/dL
Ketones, POC UA: NEGATIVE mg/dL
Leukocytes, UA: NEGATIVE
Nitrite, UA: NEGATIVE
Protein Ur, POC: NEGATIVE mg/dL
Spec Grav, UA: 1.01 (ref 1.010–1.025)
Urobilinogen, UA: 0.2 E.U./dL
pH, UA: 7 (ref 5.0–8.0)

## 2021-07-04 MED ORDER — PREDNISONE 10 MG (21) PO TBPK: ORAL_TABLET | ORAL | 0 refills | Status: DC

## 2021-07-04 NOTE — Patient Instructions (Signed)
Urine was clear. ? ?Xray today. ? ?Medication as prescribed. ? ?Call with concerns. ?

## 2021-07-07 DIAGNOSIS — M545 Low back pain, unspecified: Secondary | ICD-10-CM | POA: Insufficient documentation

## 2021-07-07 DIAGNOSIS — R1030 Lower abdominal pain, unspecified: Secondary | ICD-10-CM | POA: Insufficient documentation

## 2021-07-07 NOTE — Progress Notes (Signed)
? ?Subjective:  ?Patient ID: Jeffrey [name], male    DOB: 11-Jan-1971  Age: 51 y.o. MRN: [id number] ? ?CC: ?Chief Complaint  ?Patient presents with  ? low back right side   ?  X 6 days Feels worse with lifting leg  ? pressure in the lower abd area   ? ? ?HPI: ? ?51 year old male presents for evaluation the above. ? ?Patient reports low back pain since Saturday.  He states that he was recently doing some lifting.  He is unsure if this is related.  He states that it is the right side of the low back.  Patient also states that he has had some pressure in his lower abdominal area.  He is also unsure if this is related.  He states that his back pain worsens particular when he raises his right leg.  No radicular symptoms.  No relieving factors.  No other complaints. ? ?Patient Active Problem List  ? Diagnosis Date Noted  ? Low back pain 07/07/2021  ? Lower abdominal pain 07/07/2021  ? Arthralgia 06/12/2021  ? H/O pyloric stenosis 07/17/2020  ? COPD (chronic obstructive pulmonary disease) ([place]) 12/18/2015  ? 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 GERD 10/17/2009  ? ? ?Social Hx   ?Social History  ? ?Socioeconomic History  ? Marital status: Divorced  ?  Spouse name: Not on file  ? Number of children: Not on file  ? Years of education: Not on file  ? Highest education level: Not on file  ?Occupational History  ? Not on file  ?Tobacco Use  ? Smoking status: Former  ? Smokeless tobacco: Current  ?  Types: Chew  ?Vaping Use  ? Vaping Use: Never used  ?Substance and Sexual Activity  ? Alcohol use: Yes  ?  Comment: 18 pack per week  ? Drug use: Yes  ?  Types: Marijuana  ?  Comment: occas  ? Sexual activity: Not on file  ?Other Topics Concern  ? Not on file  ?Social History Narrative  ? 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  ? ?Social Determinants of Health  ? ?Financial Resource Strain: Not on file  ?Food Insecurity: Not on file  ?Transportation Needs: Not on file  ?Physical  Activity: Not on file  ?Stress: Not on file  ?Social Connections: Not on file  ? ? ?Review of Systems ?Per HPI ? ?Objective:  ?BP (!) 125/92   Pulse 83   Temp (!) 97.3 ?F (36.3 ?C)   Ht 6' (1.829 m)   Wt 276 lb (125.2 kg)   SpO2 100%   BMI 37.43 kg/m?  ? ? ?  07/04/2021  ? 11:01 AM 06/11/2021  ?  2:03 PM 03/13/2021  ?  2:46 PM  ?BP/Weight  ?Systolic BP 235 573 220  ?Diastolic BP 92 83 88  ?Wt. (Lbs) 276 275   ?BMI 37.43 kg/m2 37.3 kg/m2   ? ? ?Physical Exam ?Vitals and nursing note reviewed.  ?Constitutional:   ?   General: He is not in acute distress. ?   Appearance: Normal appearance.  ?HENT:  ?   Head: Normocephalic and atraumatic.  ?Cardiovascular:  ?   Rate and Rhythm: Normal rate and regular rhythm.  ?Pulmonary:  ?   Effort: Pulmonary effort is normal.  ?   Breath sounds: Normal breath sounds. No wheezing, rhonchi or rales.  ?Musculoskeletal:  ?   Comments: Tenderness of the right side of the lumbar spine.  ?Neurological:  ?  Mental Status: He is alert.  ?Psychiatric:     ?   Mood and Affect: Mood normal.     ?   Behavior: Behavior normal.  ? ? ?Lab Results  ?Component Value Date  ? WBC 5.1 06/11/2021  ? HGB 15.4 06/11/2021  ? HCT 44.1 06/11/2021  ? PLT 205 06/11/2021  ? GLUCOSE 76 06/11/2021  ? CHOL 275 (H) 03/27/2019  ? TRIG 414 (H) 03/27/2019  ? HDL 39 (L) 03/27/2019  ? LDLCALC 157 (H) 03/27/2019  ? ALT 44 06/11/2021  ? AST 31 06/11/2021  ? NA 140 06/11/2021  ? K 4.6 06/11/2021  ? CL 100 06/11/2021  ? CREATININE 0.90 06/11/2021  ? BUN 10 06/11/2021  ? CO2 20 06/11/2021  ? TSH 1.440 03/07/2019  ? ? ? ?Assessment & Plan:  ? ?Problem List Items Addressed This Visit   ? ?  ? Other  ? Low back pain - Primary  ?  X-ray lumbar spine was obtained.  Age indeterminant height loss at the superior endplate of L2.  Treating with prednisone taper. ? ?  ?  ? Relevant Medications  ? predniSONE (STERAPRED UNI-PAK 21 TAB) 10 MG (21) TBPK tablet  ? Lower abdominal pain  ?  Urinalysis was clear.  Advised to monitor  closely. ? ?  ?  ? Relevant Orders  ? POCT urinalysis dipstick (Completed)  ? DG Lumbar Spine Complete (Completed)  ? ? ?Meds ordered this encounter  ?Medications  ? predniSONE (STERAPRED UNI-PAK 21 TAB) 10 MG (21) TBPK tablet  ?  Sig: 6 tablets on day 1; decrease by 1 tablet daily until gone.  ?  Dispense:  21 tablet  ?  Refill:  0  ?\ ?Thersa Salt DO ?[place] ? ?

## 2021-07-07 NOTE — Assessment & Plan Note (Signed)
Urinalysis was clear.  Advised to monitor closely. ?

## 2021-07-07 NOTE — Assessment & Plan Note (Signed)
X-ray lumbar spine was obtained.  Age indeterminant height loss at the superior endplate of L2.  Treating with prednisone taper. ?

## 2021-10-08 ENCOUNTER — Other Ambulatory Visit: Payer: Self-pay | Admitting: Family Medicine

## 2021-10-08 ENCOUNTER — Ambulatory Visit (INDEPENDENT_AMBULATORY_CARE_PROVIDER_SITE_OTHER): Payer: BC Managed Care – PPO | Admitting: Family Medicine

## 2021-10-08 ENCOUNTER — Encounter: Payer: Self-pay | Admitting: Family Medicine

## 2021-10-08 VITALS — BP 138/86 | HR 87 | Temp 97.9°F | Wt 277.6 lb

## 2021-10-08 DIAGNOSIS — Z0000 Encounter for general adult medical examination without abnormal findings: Secondary | ICD-10-CM

## 2021-10-08 DIAGNOSIS — J301 Allergic rhinitis due to pollen: Secondary | ICD-10-CM

## 2021-10-08 DIAGNOSIS — Z79899 Other long term (current) drug therapy: Secondary | ICD-10-CM

## 2021-10-08 DIAGNOSIS — Z13220 Encounter for screening for lipoid disorders: Secondary | ICD-10-CM

## 2021-10-08 MED ORDER — AZELASTINE-FLUTICASONE 137-50 MCG/ACT NA SUSP: 1.0000 | Freq: Two times a day (BID) | NASAL | 4 refills | Status: DC

## 2021-10-08 NOTE — Progress Notes (Signed)
   Subjective:    Patient ID: Jeffrey [name], male    DOB: 1970/11/04, 51 y.o.   MRN: [id number]  HPI Pt arrives with drainage, sore throat, gland sore on neck, irritation behind eyes. Drainage has been ongoing. Nasal spray not working.  He is concerned that there is an area on the right side of his neck that gets bigger when he turns his head toward the left and sometimes when he tilts he sees at it makes him concerned that there is an underlying tumor or growth he denies any nodules in his neck he does state he gets some stones in his tonsils which bothers him as well  Review of Systems     Objective:   Physical Exam  Lungs are clear hearts regular blood pressure is good on recheck Eardrums normal nares normal throat normal No neck masses are found on exam    Assessment & Plan:  Patient has tonsillar stones I would not recommend any treatment other than salt water gargles  As for the allergic rhinitis recommend Dymista in place at the [place] as well as Claritin generic  As for the area on the right side posterior neck I do not find any evidence of lymph node but if he keeps having unexplained swelling of that area that comes and goes that I would recommend CT soft tissue scan I do not find any evidence of tumor growth cancer.  It would be wise for him to do a wellness in 6 to 8 weeks so we can recheck this area Wellness in the near future

## 2021-10-08 NOTE — Progress Notes (Signed)
10/08/21 lab orders placed and mailed to patient with note to have them done before wellness visit.

## 2021-10-14 ENCOUNTER — Telehealth: Payer: Self-pay | Admitting: Family Medicine

## 2021-10-14 NOTE — Telephone Encounter (Signed)
Recently patient was seen for nasal symptoms and I will prescribe Dymista which is a combination of Astelin as well as fluticasone  His insurance sent [place] a denial stating that they would only cover this if he has tried a trial of Flonase and also Nasacort and failed both before they would cover this particular medicine  Please call patient to bring him up to speed on what the insurance company told us  If after trying the other medicines he still has nasal congestion would like for [place] to send the [place] back in we can do so typically he would have to try a month on each before they would reconsider  Thanks

## 2021-10-14 NOTE — Telephone Encounter (Signed)
Pt contacted and verbalized understanding. Pt states he is pretty sure he has already tried Nasacort and Flonase. Pt would like script sent to [facility] for Flonase and/or Nasacort. Please advise. Thank you

## 2021-10-15 NOTE — Telephone Encounter (Signed)
Both Nasacort and Flonase are available over-the-counter My understanding that he would have to try 1 of these for 1 month if it did not work he would have to try the other for an additional month And if that did not work then [employer] could be tried again in regards to prior authorization  May go ahead and send in prescription for Flonase 2 sprays each nostril once daily but patient should be informed that with this being an over-the-counter they may tell him that he has to buy over-the-counter may have this with 3 refills

## 2021-10-15 NOTE — Telephone Encounter (Signed)
Patient notified of provider's recommendations and stated he will get the Flonase OTC

## 2021-11-03 ENCOUNTER — Ambulatory Visit
Admission: RE | Admit: 2021-11-03 | Discharge: 2021-11-03 | Disposition: A | Discharge status: Home / Self Care | Payer: BC Managed Care – PPO | Source: Ambulatory Visit | Attending: Nurse Practitioner | Admitting: Nurse Practitioner

## 2021-11-03 VITALS — BP 134/91 | HR 99 | Temp 97.4°F | Resp 17

## 2021-11-03 DIAGNOSIS — J069 Acute upper respiratory infection, unspecified: Secondary | ICD-10-CM

## 2021-11-03 MED ORDER — DEXAMETHASONE SODIUM PHOSPHATE 10 MG/ML IJ SOLN
10.0000 mg | INTRAMUSCULAR | Status: AC
  Administered 2021-11-03: 10 mg via INTRAMUSCULAR

## 2021-11-03 MED ORDER — PROMETHAZINE-DM 6.25-15 MG/5ML PO SYRP: 5.0000 mL | ORAL_SOLUTION | Freq: Four times a day (QID) | ORAL | 0 refills | Status: DC | PRN

## 2021-11-03 NOTE — ED Provider Notes (Signed)
[facility]    CSN: [id number] Arrival date & time: 11/03/21  1317      History   Chief Complaint Chief Complaint  Patient presents with   Cough    Feel like I'm getting or have a upper respiratory infection - Entered by patient    HPI Jeffrey [name] is a 51 y.o. male.   The history is provided by the patient.   Patient presents for 2-day history of cough and chest "burning".  Patient states cough is now worse at night.  He has a history of COPD/acute bronchitis.  Patient states that he feels like his symptoms are worsening.  He denies fever, chills, nasal congestion, runny nose, wheezing, shortness of breath, difficulty breathing, or symptoms.  Patient states he has been taking over-the-counter NyQuil for his symptoms.  Patient states that he also has a history of acid reflux.  He states that he normally takes pantoprazole for his symptoms.  Patient was concerned that he may have an upper respiratory infection.  Patient declines COVID testing today.  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   GERD 10/17/2009    Past Surgical History:  Procedure Laterality Date   BALLOON DILATION N/A 08/19/2020   Procedure: BALLOON DILATION;  Surgeon: Eloise Harman, DO;  [facility];  Service: Endoscopy;  Laterality: N/A;   BIOPSY  08/19/2020   Procedure: BIOPSY;  Surgeon: Eloise Harman, DO;  [facility];  Service: Endoscopy;;   cardiac stress test     CHOLECYSTECTOMY     COLONOSCOPY N/A 09/19/2013   Procedure: COLONOSCOPY;  Surgeon: [name], MD;  [facility];  Service: Endoscopy;  normal TI, 1 colon polyp removed but not retrieved, mild sigmoid diverticulosis with associated luminal narrowing, moderate sized external hemorrhoids.  Recommended repeat colonoscopy in 5 years.    COLONOSCOPY  2011   Dr. 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  Gastric biopsies with reactive/regenerative changes and focal intestinal metaplasia with no H. pylori, dysplasia, or malignancy.   ESOPHAGOGASTRODUODENOSCOPY (EGD) WITH PROPOFOL N/A 08/19/2020   Procedure: ESOPHAGOGASTRODUODENOSCOPY (EGD) WITH PROPOFOL;  Surgeon: Eloise Harman, DO;  [facility];  Service: Endoscopy;  Laterality: N/A;  10:45am   ESOPHAGOGASTRODUODENOSCOPY ENDOSCOPY     MALONEY DILATION N/A 09/19/2013   Procedure: MALONEY DILATION;  Surgeon: [name], MD;  [facility];  Service: Endoscopy;  Laterality: N/A;   POLYPECTOMY  01/09/2020   Procedure: POLYPECTOMY;  Surgeon: Eloise Harman, DO;  [facility];  Service: Endoscopy;;   POLYPECTOMY  08/19/2020   Procedure: POLYPECTOMY;  Surgeon: Eloise Harman, DO;  [facility];  Service: Endoscopy;;  SAVORY DILATION N/A 09/19/2013   Procedure: SAVORY DILATION;  Surgeon: [name], MD;  [facility];  Service: Endoscopy;  Laterality: N/A;       Home Medications    Prior to Admission medications   Medication Sig Start Date End Date Taking? Authorizing Provider  promethazine-dextromethorphan  (PROMETHAZINE-DM) 6.25-15 MG/5ML syrup Take 5 mLs by mouth 4 (four) times daily as needed for cough. 11/03/21  Yes Angelene Rome-Warren, Alda Lea, NP  albuterol (VENTOLIN HFA) 108 (90 Base) MCG/ACT inhaler INHALE 2 PUFFS BY MOUTH EVERY 4 HOURS AS NEEDED FOR WHEEZING OR SHORTNESS OF BREATH 06/27/21   Kathyrn Drown, MD  allopurinol (ZYLOPRIM) 100 MG tablet Take 1 tablet (100 mg total) by mouth daily. 06/13/21   Coral Spikes, DO  Ascorbic Acid (VITAMIN C) 1000 MG tablet Take 1,000 mg by mouth in the morning.    [provider]  Azelastine-Fluticasone 137-50 MCG/ACT SUSP Place 1 spray into the nose every 12 (twelve) hours. 10/08/21   Kathyrn Drown, MD  colchicine 0.6 MG tablet Take 1 tablet (0.6 mg total) by mouth daily. 06/13/21   Coral Spikes, DO  loratadine (CLARITIN) 10 MG tablet Take 10 mg by mouth daily as needed for allergies.    [provider]  meloxicam (MOBIC) 15 MG tablet Take 1 tablet (15 mg total) by mouth daily as needed for pain. 06/11/21   Coral Spikes, DO  pantoprazole (PROTONIX) 40 MG tablet Take 1 tablet (40 mg total) by mouth daily. 06/11/21   Coral Spikes, DO    Family History Family History  Problem Relation Age of Onset   Colon cancer Neg Hx    Colon polyps Neg Hx     Social History Social History   Tobacco Use   Smoking status: Former   Smokeless tobacco: Current    Types: Nurse, children's Use: Never used  Substance Use Topics   Alcohol use: Yes    Comment: 18 pack per week   Drug use: Yes    Types: Marijuana    Comment: occas     Allergies   Patient has no known allergies.   Review of Systems Review of Systems Per HPI  Physical Exam Triage Vital Signs ED Triage Vitals  Enc Vitals Group     BP 11/03/21 1333 (!) 134/91     Pulse Rate 11/03/21 1333 99     Resp 11/03/21 1333 17     Temp 11/03/21 1333 (!) 97.4 F (36.3 C)     Temp Source 11/03/21 1333 Oral     SpO2 11/03/21 1333 97 %     Weight --      Height --      Head  Circumference --      Peak Flow --      Pain Score 11/03/21 1341 0     Pain Loc --      Pain Edu? --      Excl. in [place]? --    No data found.  Updated Vital Signs BP (!) 134/91 (BP Location: Right Arm)   Pulse 99   Temp (!) 97.4 F (36.3 C) (Oral)   Resp 17   SpO2 97%   Visual Acuity Right Eye Distance:   Left Eye Distance:   Bilateral Distance:    Right Eye Near:   Left Eye Near:    Bilateral Near:     Physical Exam Constitutional:      General:  He is not in acute distress.    Appearance: Normal appearance. He is well-developed.  HENT:     Head: Normocephalic and atraumatic.     Right Ear: Tympanic membrane, ear canal and external ear normal.     Left Ear: Tympanic membrane and ear canal normal.     Nose: Nose normal.     Right Turbinates: Enlarged and swollen.     Left Turbinates: Enlarged and swollen.     Right Sinus: No maxillary sinus tenderness or frontal sinus tenderness.     Left Sinus: No maxillary sinus tenderness or frontal sinus tenderness.     Mouth/Throat:     Lips: Pink.     Mouth: Mucous membranes are moist.     Pharynx: Uvula midline. Posterior oropharyngeal erythema present.     Tonsils: No tonsillar exudate. 0 on the right. 0 on the left.  Eyes:     Extraocular Movements: Extraocular movements intact.     Conjunctiva/sclera: Conjunctivae normal.     Pupils: Pupils are equal, round, and reactive to light.  Neck:     Thyroid: No thyromegaly.     Trachea: No tracheal deviation.  Cardiovascular:     Rate and Rhythm: Normal rate and regular rhythm.     Pulses: Normal pulses.     Heart sounds: Normal heart sounds.  Pulmonary:     Effort: Pulmonary effort is normal.     Breath sounds: Normal breath sounds.  Abdominal:     General: Bowel sounds are normal. There is no distension.     Palpations: Abdomen is soft.     Tenderness: There is no abdominal tenderness.  Musculoskeletal:     Cervical back: Normal range of motion and neck supple.   Skin:    General: Skin is warm and dry.  Neurological:     General: No focal deficit present.     Mental Status: He is alert and oriented to person, place, and time.  Psychiatric:        Mood and Affect: Mood normal.        Behavior: Behavior normal.        Thought Content: Thought content normal.        Judgment: Judgment normal.      UC Treatments / Results  Labs (all labs ordered are listed, but only abnormal results are displayed) Labs Reviewed - No data to display  EKG   Radiology No results found.  Procedures Procedures (including critical care time)  Medications Ordered in UC Medications  dexamethasone (DECADRON) injection 10 mg (10 mg Intramuscular Given 11/03/21 1412)    Initial Impression / Assessment and Plan / UC Course  I have reviewed the triage vital signs and the nursing notes.  Pertinent labs & imaging results that were available during my care of the patient were reviewed by me and considered in my medical decision making (see chart for details).  Patient presents for complaints of cough that been present for the past 2 days.  Patient's vital signs are stable, he is in no acute distress.  On exam, lungs are clear throughout.  Differential diagnoses include acute bronchitis, viral upper respiratory infection, or acute sinusitis.  Patient has a history of COPD/bronchitis.  Patient was given Decadron 10 mg IM injection in the clinic today for bronchial inflammation.  For his cough symptoms at home, patient was prescribed Promethazine DM.  Currently has an albuterol inhaler, advised to continue use for wheezing or shortness of breath.  Patient also informs  that he has reactions to prednisone, so we will forego this medication at this time.  Supportive care recommendations were provided to the patient along with indications of when to follow-up.  Patient verbalizes understanding.  All questions were answered. Final Clinical Impressions(s) / UC Diagnoses   Final  diagnoses:  Viral upper respiratory tract infection with cough     Discharge Instructions      Take medication as prescribed. Increase fluids and allow for plenty of rest. Recommend Tylenol or ibuprofen as needed for pain, fever, or general discomfort. Warm salt water gargles 3-4 times daily to help with throat pain or discomfort. Recommend using a humidifier at bedtime during sleep to help with cough and nasal congestion. Sleep elevated on 2 pillows while cough symptoms persist. Follow-up in this clinic if you experience worsening shortness of breath, trouble breathing, wheezing, or other concerns.  Also follow-up if you develop fever, chills, or other upper respiratory symptoms. Go to the emergency department immediately if you become unable to speak in a complete sentence, have severe wheezing or trouble breathing.     ED Prescriptions     Medication Sig Dispense Auth. Provider   promethazine-dextromethorphan (PROMETHAZINE-DM) 6.25-15 MG/5ML syrup Take 5 mLs by mouth 4 (four) times daily as needed for cough. 140 mL Deshauna Cayson-Warren, Alda Lea, NP      PDMP not reviewed this encounter.   Tish Men, NP 11/03/21 1435

## 2021-11-03 NOTE — Discharge Instructions (Addendum)
Take medication as prescribed. Increase fluids and allow for plenty of rest. Recommend Tylenol or ibuprofen as needed for pain, fever, or general discomfort. Warm salt water gargles 3-4 times daily to help with throat pain or discomfort. Recommend using a humidifier at bedtime during sleep to help with cough and nasal congestion. Sleep elevated on 2 pillows while cough symptoms persist. Follow-up in this clinic if you experience worsening shortness of breath, trouble breathing, wheezing, or other concerns.  Also follow-up if you develop fever, chills, or other upper respiratory symptoms. Go to the emergency department immediately if you become unable to speak in a complete sentence, have severe wheezing or trouble breathing.

## 2021-11-03 NOTE — ED Triage Notes (Signed)
Pt presents with cough and nasal congestion since Saturday, declines covid testing. Pt concerned for bronchitis

## 2021-11-25 ENCOUNTER — Encounter: Payer: BC Managed Care – PPO | Admitting: Family Medicine

## 2021-11-25 ENCOUNTER — Encounter: Payer: Self-pay | Admitting: Family Medicine

## 2021-12-25 ENCOUNTER — Encounter: Payer: BC Managed Care – PPO | Admitting: Family Medicine

## 2022-01-28 ENCOUNTER — Encounter: Payer: BC Managed Care – PPO | Admitting: Family Medicine

## 2022-02-03 ENCOUNTER — Telehealth: Payer: Self-pay | Admitting: *Deleted

## 2022-02-03 NOTE — Telephone Encounter (Signed)
Pt contacted and verbalized understanding.  

## 2022-02-03 NOTE — Telephone Encounter (Signed)
PA for Azelastine-Fluticasone 137-79mg/act NA Susp denied by insurance- review by clinical pharmacist at insurance determined it did not meet the definition of medically necessary as found in members benefit plan

## 2022-02-03 NOTE — Telephone Encounter (Signed)
Nothing surprises me anymore It would be nice to have a printout regarding this decision but make sure patient is informed that the medicine was denied Available over-the-counter is Flonase and Astelin which are the 2 components of this nasal spray he can buy this over-the-counter and use both of them to get the same effect

## 2022-04-02 ENCOUNTER — Other Ambulatory Visit: Payer: Self-pay | Admitting: Family Medicine

## 2022-04-13 ENCOUNTER — Ambulatory Visit: Payer: BC Managed Care – PPO | Admitting: Family Medicine

## 2022-04-13 VITALS — BP 118/84 | Wt 260.4 lb

## 2022-04-13 DIAGNOSIS — R109 Unspecified abdominal pain: Secondary | ICD-10-CM

## 2022-04-13 DIAGNOSIS — M10072 Idiopathic gout, left ankle and foot: Secondary | ICD-10-CM

## 2022-04-13 DIAGNOSIS — R1084 Generalized abdominal pain: Secondary | ICD-10-CM | POA: Diagnosis not present

## 2022-04-13 DIAGNOSIS — R82998 Other abnormal findings in urine: Secondary | ICD-10-CM | POA: Diagnosis not present

## 2022-04-13 NOTE — Progress Notes (Signed)
   Subjective:    Patient ID: Jeffrey [name], male    DOB: January 22, 1971, 52 y.o.   MRN: [id number]  HPI Patient arrives today with pain on right side.  He has a fair amount of right side pain is been present for years but sometimes it causes abdominal pain and discomfort He worries that there may be something more going on than what has been diagnosed He has not had any weight loss.  Denies fever chills night sweats.  Denies nausea vomiting diarrhea He has had a fair amount of joint discomforts some in the neck some of the back some arms and hands. Previous workup had negative serology  patient states he is experiencing joint pain.  He also has underlying gout issues that comes and goes and needs to have up-to-date labs Does have some intermittent abdominal pain and relates recently his urine has been foamy in appearance   Review of Systems     Objective:   Physical Exam General-in no acute distress Eyes-no discharge Lungs-respiratory rate normal, CTA CV-no murmurs,RRR Extremities skin warm dry no edema Neuro grossly normal Behavior normal, alert        Assessment & Plan:  1. Foamy urine Will check urine to make sure there is not significant micro protein in it - Microalbumin/Creatinine Ratio, Urine - Urinalysis, Routine w reflex microscopic  2. Generalized abdominal pain Abdominal pain is worse on the right side it has been present for over a couple of years progressively worse sometimes abdominal pain sometimes flank pain patient is concerned he may have something going on undetected We have tried conservative measures including medications exercises positioning given his ongoing abdominal discomfort along with flank pain and discomfort we will set him up for a CAT scan  3. Flank pain See discussion above lab work ordered - CBC with Differential - Comprehensive metabolic panel - CT Abdomen Pelvis W Contrast; Future  4. Acute idiopathic gout involving toe of left  foot Uric acid ordered - Uric Acid  Depending on the thoughts of this we will also set him up with a physical medicine specialist Dr.Fred Thermon Leyland

## 2022-04-16 LAB — COMPREHENSIVE METABOLIC PANEL
ALT: 30 IU/L (ref 0–44)
AST: 23 IU/L (ref 0–40)
Albumin/Globulin Ratio: 2 (ref 1.2–2.2)
Albumin: 5.1 g/dL (ref 4.1–5.1)
Alkaline Phosphatase: 81 IU/L (ref 44–121)
BUN/Creatinine Ratio: 13 (ref 9–20)
BUN: 12 mg/dL (ref 6–24)
Bilirubin Total: 0.3 mg/dL (ref 0.0–1.2)
CO2: 24 mmol/L (ref 20–29)
Calcium: 9.7 mg/dL (ref 8.7–10.2)
Chloride: 100 mmol/L (ref 96–106)
Creatinine, Ser: 0.92 mg/dL (ref 0.76–1.27)
Globulin, Total: 2.6 g/dL (ref 1.5–4.5)
Glucose: 83 mg/dL (ref 70–99)
Potassium: 4.5 mmol/L (ref 3.5–5.2)
Sodium: 140 mmol/L (ref 134–144)
Total Protein: 7.7 g/dL (ref 6.0–8.5)
eGFR: 102 mL/min/{1.73_m2} (ref >59)

## 2022-04-16 LAB — CBC WITH DIFFERENTIAL/PLATELET
Basophils Absolute: 0 10*3/uL (ref 0.0–0.2)
Basos: 1 %
EOS (ABSOLUTE): 0.1 10*3/uL (ref 0.0–0.4)
Eos: 1 %
Hematocrit: 42.3 % (ref 37.5–51.0)
Hemoglobin: 14.4 g/dL (ref 13.0–17.7)
Immature Grans (Abs): 0 10*3/uL (ref 0.0–0.1)
Immature Granulocytes: 1 %
Lymphocytes Absolute: 1.5 10*3/uL (ref 0.7–3.1)
Lymphs: 33 %
MCH: 30.7 pg (ref 26.6–33.0)
MCHC: 34 g/dL (ref 31.5–35.7)
MCV: 90 fL (ref 79–97)
Monocytes Absolute: 0.5 10*3/uL (ref 0.1–0.9)
Monocytes: 12 %
Neutrophils Absolute: 2.3 10*3/uL (ref 1.4–7.0)
Neutrophils: 52 %
Platelets: 201 10*3/uL (ref 150–450)
RBC: 4.69 x10E6/uL (ref 4.14–5.80)
RDW: 13.9 % (ref 11.6–15.4)
WBC: 4.4 10*3/uL (ref 3.4–10.8)

## 2022-04-16 LAB — URINALYSIS, ROUTINE W REFLEX MICROSCOPIC
Bilirubin, UA: NEGATIVE
Glucose, UA: NEGATIVE
Ketones, UA: NEGATIVE
Leukocytes,UA: NEGATIVE
Nitrite, UA: NEGATIVE
Protein,UA: NEGATIVE
RBC, UA: NEGATIVE
Specific Gravity, UA: 1.012 (ref 1.005–1.030)
Urobilinogen, Ur: 0.2 mg/dL (ref 0.2–1.0)
pH, UA: 7 (ref 5.0–7.5)

## 2022-04-16 LAB — MICROALBUMIN / CREATININE URINE RATIO
Creatinine, Urine: 53.6 mg/dL
Microalb/Creat Ratio: <6 mg/g creat (ref 0–29)
Microalbumin, Urine: <3 ug/mL

## 2022-04-16 LAB — URIC ACID: Uric Acid: 8.9 mg/dL — ABNORMAL HIGH (ref 3.8–8.4)

## 2022-04-17 ENCOUNTER — Telehealth: Payer: Self-pay | Admitting: Family Medicine

## 2022-04-17 DIAGNOSIS — R109 Unspecified abdominal pain: Secondary | ICD-10-CM

## 2022-04-17 DIAGNOSIS — R1084 Generalized abdominal pain: Secondary | ICD-10-CM

## 2022-04-17 DIAGNOSIS — M10072 Idiopathic gout, left ankle and foot: Secondary | ICD-10-CM

## 2022-04-17 NOTE — Telephone Encounter (Signed)
Nurses We tried to get a CAT scan on this patient but it was denied his insurance company basically states that in addition to the blood work and a abdominal exam he would need to have a ultrasound of the abdomen first before they would even consider a CAT scan so I would recommend going ahead with setting up an ultrasound  Also because of his ongoing right flank right side abdominal discomfort I would recommend consultation with Dr. Vonzella Nipple he is a physiatrist who may be able to help explain why he keeps having this musculoskeletal pain

## 2022-04-20 MED ORDER — COLCHICINE 0.6 MG PO TABS: ORAL_TABLET | ORAL | 2 refills | Status: DC

## 2022-04-20 MED ORDER — ALLOPURINOL 100 MG PO TABS: ORAL_TABLET | ORAL | 4 refills | Status: DC

## 2022-04-20 NOTE — Telephone Encounter (Signed)
Pt contacted and verbalized understanding. Referral placed and ultrasound ordered.  Ultrasound scheduled for 04/29/22 @ 8:30 am-arrive at 8:15 am (NPO after midnight) Pt contacted and made aware.

## 2022-04-29 ENCOUNTER — Ambulatory Visit (HOSPITAL_COMMUNITY)
Admission: RE | Admit: 2022-04-29 | Discharge: 2022-04-29 | Disposition: A | Discharge status: Home / Self Care | Payer: BC Managed Care – PPO | Source: Ambulatory Visit | Attending: Family Medicine | Admitting: Family Medicine

## 2022-04-29 DIAGNOSIS — R1084 Generalized abdominal pain: Secondary | ICD-10-CM | POA: Diagnosis not present

## 2022-04-29 DIAGNOSIS — Z9889 Other specified postprocedural states: Secondary | ICD-10-CM | POA: Diagnosis not present

## 2022-04-29 DIAGNOSIS — R109 Unspecified abdominal pain: Secondary | ICD-10-CM | POA: Diagnosis not present

## 2022-04-30 ENCOUNTER — Telehealth: Payer: Self-pay | Admitting: Family Medicine

## 2022-04-30 NOTE — Telephone Encounter (Signed)
Pt contacted and verbalized understanding. Referral coordinator messaged and asked how to fix referral. Will fix referral once Jeffrey [name] gets back in touch with me.

## 2022-04-30 NOTE — Telephone Encounter (Signed)
Nurses His ultrasound overall looks good does show some findings of fatty liver which is not uncommon finding.  This is related to where excessive fat can work its way into the liver.  His most recent blood work shows liver enzymes are normal so the approach to this issue would be healthy eating regular activity and trying to bring weight down.  Also when he was last here we referred him to Dr. Adline Peals medicine specialist for his flank pain and discomfort.  The referral in the system says he was referred to [facility].  Unfortunately Dr.Newton works with [facility] in the physical medicine department.  Please get this corrected.  Also please notify the patient that we prefer for him to see Dr. Ernestina Patches (obviously you will need to talk with the patient if he is already seen [facility] I would leave it at that)  Let me know if any issues

## 2022-05-07 ENCOUNTER — Encounter: Payer: Self-pay | Admitting: Physical Medicine and Rehabilitation

## 2022-05-07 ENCOUNTER — Ambulatory Visit: Payer: BC Managed Care – PPO | Admitting: Physical Medicine and Rehabilitation

## 2022-05-07 DIAGNOSIS — M546 Pain in thoracic spine: Secondary | ICD-10-CM

## 2022-05-07 DIAGNOSIS — M5412 Radiculopathy, cervical region: Secondary | ICD-10-CM | POA: Diagnosis not present

## 2022-05-07 DIAGNOSIS — G8929 Other chronic pain: Secondary | ICD-10-CM

## 2022-05-07 DIAGNOSIS — M7918 Myalgia, other site: Secondary | ICD-10-CM

## 2022-05-07 DIAGNOSIS — M542 Cervicalgia: Secondary | ICD-10-CM
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
## 2022-05-07 NOTE — Progress Notes (Signed)
Functional Pain Scale - descriptive words and definitions  Distracting (5)    Aware of pain/able to complete some ADL's but limited by pain/sleep is affected and active distractions are only slightly useful. Moderate range order  Average Pain 6-7  Neck pain in the center that radiates to the upper back between shoulder blades

## 2022-05-18 ENCOUNTER — Ambulatory Visit
Admission: RE | Admit: 2022-05-18 | Discharge: 2022-05-18 | Disposition: A | Discharge status: Home / Self Care | Payer: BC Managed Care – PPO | Source: Ambulatory Visit | Attending: Physical Medicine and Rehabilitation | Admitting: Physical Medicine and Rehabilitation

## 2022-05-18 DIAGNOSIS — M542 Cervicalgia: Secondary | ICD-10-CM | POA: Diagnosis not present

## 2022-05-19 ENCOUNTER — Other Ambulatory Visit: Payer: Self-pay | Admitting: Physical Medicine and Rehabilitation

## 2022-05-19 ENCOUNTER — Telehealth: Payer: Self-pay | Admitting: Physical Medicine and Rehabilitation

## 2022-05-19 DIAGNOSIS — M7918 Myalgia, other site: Secondary | ICD-10-CM

## 2022-05-19 DIAGNOSIS — M542 Cervicalgia: Secondary | ICD-10-CM

## 2022-05-19 DIAGNOSIS — G8929 Other chronic pain: Secondary | ICD-10-CM

## 2022-05-19 NOTE — Telephone Encounter (Signed)
Spoke with patient via telephone this afternoon. Recent cervical MRI imaging looks good, no significant changes from prior imaging in 2015. No foraminal or central canal stenosis noted. Options would be re-group with PT for dry needling, could also look at medications. Patient would like to take some time to think about options, he will contact us when he is ready to move forward.

## 2022-07-02 ENCOUNTER — Other Ambulatory Visit: Payer: Self-pay | Admitting: Family Medicine

## 2022-07-14 ENCOUNTER — Ambulatory Visit: Payer: BC Managed Care – PPO | Admitting: Family Medicine

## 2022-07-14 VITALS — BP 132/94 | HR 95 | Ht 72.0 in | Wt 258.6 lb

## 2022-07-14 DIAGNOSIS — R0789 Other chest pain: Secondary | ICD-10-CM

## 2022-07-14 DIAGNOSIS — F411 Generalized anxiety disorder: Secondary | ICD-10-CM | POA: Diagnosis not present

## 2022-07-14 DIAGNOSIS — R109 Unspecified abdominal pain: Secondary | ICD-10-CM | POA: Diagnosis not present

## 2022-07-14 MED ORDER — DULOXETINE HCL 30 MG PO CPEP: 30.0000 mg | ORAL_CAPSULE | Freq: Every day | ORAL | 3 refills | Status: DC

## 2022-07-14 MED ORDER — SUCRALFATE 1 G PO TABS: 1.0000 g | ORAL_TABLET | Freq: Three times a day (TID) | ORAL | 0 refills | Status: DC

## 2022-07-14 NOTE — Progress Notes (Addendum)
Subjective:    Patient ID: Jeffrey [name], male    DOB: 06/30/1970, 52 y.o.   MRN: [id number]  HPI Patient arrives for 3 month follow up. Patient states no concerns or issues .  Outpatient Encounter Medications as of 07/14/2022  Medication Sig   albuterol (VENTOLIN HFA) 108 (90 Base) MCG/ACT inhaler INHALE 2 PUFFS BY MOUTH EVERY 4 HOURS AS NEEDED FOR WHEEZING OR SHORTNESS OF BREATH   allopurinol (ZYLOPRIM) 100 MG tablet Take one tablet po daily   Ascorbic Acid (VITAMIN C) 1000 MG tablet Take 1,000 mg by mouth in the morning.   colchicine 0.6 MG tablet Take 1 tablet (0.6 mg total) by mouth daily.   colchicine 0.6 MG tablet Take one tablet po daily for next 30 days; then may take PRN   DULoxetine (CYMBALTA) 30 MG capsule Take 1 capsule (30 mg total) by mouth daily.   loratadine (CLARITIN) 10 MG tablet Take 10 mg by mouth daily as needed for allergies.   pantoprazole (PROTONIX) 40 MG tablet Take 1 tablet by mouth once daily   sucralfate (CARAFATE) 1 g tablet Take 1 tablet (1 g total) by mouth 4 (four) times daily -  with meals and at bedtime.   No facility-administered encounter medications on file as of 07/14/2022.   He relates overall he is doing well he still has intermittent flank pain and discomfort that goes from his back to the right side he has had ultrasound needs had MRI thoracic spine he is seeing specialist even recently saw a physiatrist pain medicine specialist nobody can really say what is causing it but it has been present for years and has not really changed dramatically he does utilize anti-inflammatories intermittently for he denies any major setbacks no nausea vomiting he does state occasionally he has acid reflux and states that pantoprazole helps but also wonders if Carafate might help  He also relates he feels stressed a lot anxious a lot nervous 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  States that this is causing a financial strain he is working a lot of  hours because of this we did discuss compartmentalization and trying to avoid working too much he denies being depressed denies being suicidal   Review of Systems     Objective:   Physical Exam General-in no acute distress Eyes-no discharge Lungs-respiratory rate normal, CTA CV-no murmurs,RRR Extremities skin warm dry no edema Neuro grossly normal Behavior normal, alert        Assessment & Plan:   1. Flank pain Intermittent flank pain has had this for years we have had him on different medicines we will try Cymbalta to see if this will help with musculoskeletal pain  2. Other chest pain Patient states when he gets stressed he feels a funny feeling in his chest but when he does physical activity does not have any chest tightness pressure pain or shortness of breath  He does relate that he has some intermittent GERD symptoms epigastrically but pantoprazole helps but he states when he has flareups he would like to be able to utilize Carafate.  Patient was told we can try this but if he has ongoing symptoms he needs to let us know may need referral for EGD  3. GAD (generalized anxiety disorder) He does have a lot of stress anxiety related issues.  Finds himself worrying a lot he is in the process of opening a large business he finds himself worried about the cost worried  about paying for it worried about the functions of it We did discuss various options including Cymbalta He is interested in trying this to see if it might help with his musculoskeletal pain as well as his excessive worry Information were printed and all given to him  Follow-up in approximately 3 to 4 months patient was told that if the medication does not help him or can makes him feel bad to stop taking it and feel free to give [place] update in a few weeks time how he is doing via MyChart  We will have patient do screening lab work before his follow-up visit in September

## 2022-08-04 ENCOUNTER — Other Ambulatory Visit: Payer: Self-pay | Admitting: *Deleted

## 2022-08-04 DIAGNOSIS — E785 Hyperlipidemia, unspecified: Secondary | ICD-10-CM

## 2022-08-04 DIAGNOSIS — M10072 Idiopathic gout, left ankle and foot: Secondary | ICD-10-CM

## 2022-08-04 DIAGNOSIS — Z125 Encounter for screening for malignant neoplasm of prostate: Secondary | ICD-10-CM

## 2022-08-05 ENCOUNTER — Ambulatory Visit: Payer: BC Managed Care – PPO | Admitting: Family Medicine

## 2022-08-05 ENCOUNTER — Encounter: Payer: Self-pay | Admitting: Family Medicine

## 2022-08-05 VITALS — BP 138/88 | HR 84 | Wt 259.8 lb

## 2022-08-05 DIAGNOSIS — K921 Melena: Secondary | ICD-10-CM

## 2022-08-05 NOTE — Progress Notes (Signed)
   Subjective:    Patient ID: Jeffrey [name], male    DOB: 1970-12-23, 52 y.o.   MRN: [id number]  HPI Patient arrives today with issues of passing blood in stool.  He relates several bowel movements in the past week where he has had blood mixed in with it.  Denies high fever chills sweats vomiting diarrhea.  Denies sweats wheezing or difficulty breathing Colonoscopy back in 2021 did show internal hemorrhoids along with tubular adenoma Review of Systems     Objective:   Physical Exam General-in no acute distress Eyes-no discharge Lungs-respiratory rate normal, CTA CV-no murmurs,RRR Extremities skin warm dry no edema Neuro grossly normal Behavior normal, alert        Assessment & Plan:  Blood in stool Hemoccult was negative here Lab work ordered Referral back to GI Patient would benefit from GI evaluation possible colonoscopy more than likely bleeding is from internal hemorrhoids but I cannot know that for 100% certainty If progressive troubles or worse notify us

## 2022-08-11 DIAGNOSIS — K921 Melena: Secondary | ICD-10-CM | POA: Diagnosis not present

## 2022-08-11 NOTE — Progress Notes (Unsigned)
Referring Provider: Babs Sciara, MD Primary Care Physician:  Babs Sciara, MD Primary GI Physician: Dr. Marletta Lor  Chief Complaint  Patient presents with   Blood In Stools    Had some blood in stools.     HPI:   Jeffrey [name] is a 52 y.o. male presenting today at the request of Dr. Gerda Diss for hematochezia.  He has history of GERD, pyloric stenosis, adenomatous colon polyps including tubulovillous adenoma in 2011.  Last EGD 08/19/2020: Single gastric polyp resected and retrieved, gastric stenosis at the pylorus s/p dilation. Pathology with fundic gland polyp, mild chronic gastritis with focal activity, no H. pylori or intestinal metaplasia.  Last Colonoscopy 01/09/2020: Nonbleeding internal hemorrhoids, two 6-8 mm polyps resected and retrieved, two 3-5 mm polyps resected and retrieved.  Pathology with tubular adenomas and hyperplastic polyps.  Recommended 5-year surveillance.  Reviewed office visit with Dr. Gerda Diss 08/05/2022.  Patient reported several bowel movements over the last week with blood mixed in.  He was Hemoccult negative in the office.  Blood work was ordered and he was referred to GI.   Labs completed 6/4 hemoglobin 14.8, ferritin elevated at 524.  Today:   Rectal bleeding:  Couple weeks ago passed blood per rectum. This occurred twice. Not mixed in the stool. No recurrence.  No similar symptoms in the past.  He has had some occasional toilet tissue hematochezia for years.  Has to strain some when having a BM. Started a fiber supplement about 1 week ago which is already helping. No rectal pain. Little soreness for a couple days below the belt line and in his back after the rectal bleeding occurred. No nausea, vomiting. No NSAID use.    GERD: Well controlled on pantoprazole.   Occasional bloating sensation which he thinks is secondary to beer.   Past Medical History:  Diagnosis Date   COPD (chronic obstructive pulmonary disease) (HCC)    GERD (gastroesophageal  reflux disease)     Past Surgical History:  Procedure Laterality Date   BALLOON DILATION N/A 08/19/2020   Procedure: BALLOON DILATION;  Surgeon: Lanelle Bal, DO;  [facility];  Service: Endoscopy;  Laterality: N/A;   BIOPSY  08/19/2020   Procedure: BIOPSY;  Surgeon: Lanelle Bal, DO;  [facility];  Service: Endoscopy;;   cardiac stress test     CHOLECYSTECTOMY     COLONOSCOPY N/A 09/19/2013   Procedure: COLONOSCOPY;  Surgeon: West Bali, MD;  [facility];  Service: Endoscopy;  normal TI, 1 colon polyp removed but not retrieved, mild sigmoid diverticulosis with associated luminal narrowing, moderate sized external hemorrhoids.  Recommended repeat colonoscopy in 5 years.    COLONOSCOPY  2011   Dr. Darrick Penna; 1.2 cm rectal tubulovillous adenoma    COLONOSCOPY WITH PROPOFOL N/A 01/09/2020   Procedure: COLONOSCOPY WITH PROPOFOL;  Surgeon: Lanelle Bal, DO;  [facility];  Service: Endoscopy;  Laterality: N/A;  1:30pm   ESOPHAGOGASTRODUODENOSCOPY N/A 09/19/2013   Procedure: ESOPHAGOGASTRODUODENOSCOPY (EGD);  Surgeon: West Bali, MD; normal-appearing esophagus s/p empiric dilation, mild erosive gastritis s/p biopsy, small stenosis transversable after dilation at the pylorus, normal examined duodenum.  Gastric biopsies with reactive/regenerative changes and focal intestinal metaplasia with no H. pylori, dysplasia, or malignancy.   ESOPHAGOGASTRODUODENOSCOPY (EGD) WITH PROPOFOL N/A 08/19/2020   Procedure: ESOPHAGOGASTRODUODENOSCOPY (EGD) WITH PROPOFOL;  Surgeon: Lanelle Bal, DO;  [facility];  Service: Endoscopy;  Laterality: N/A;  10:45am   ESOPHAGOGASTRODUODENOSCOPY  ENDOSCOPY     MALONEY DILATION N/A 09/19/2013   Procedure: Elease Hashimoto DILATION;  Surgeon: West Bali, MD;  [facility];  Service: Endoscopy;  Laterality: N/A;   POLYPECTOMY  01/09/2020   Procedure: POLYPECTOMY;  Surgeon: Lanelle Bal, DO;   [facility];  Service: Endoscopy;;   POLYPECTOMY  08/19/2020   Procedure: POLYPECTOMY;  Surgeon: Lanelle Bal, DO;  [facility];  Service: Endoscopy;;   SAVORY DILATION N/A 09/19/2013   Procedure: SAVORY DILATION;  Surgeon: West Bali, MD;  [facility];  Service: Endoscopy;  Laterality: N/A;    Current Outpatient Medications  Medication Sig Dispense Refill   albuterol (VENTOLIN HFA) 108 (90 Base) MCG/ACT inhaler INHALE 2 PUFFS BY MOUTH EVERY 4 HOURS AS NEEDED FOR WHEEZING OR SHORTNESS OF BREATH 9 g 5   allopurinol (ZYLOPRIM) 100 MG tablet Take one tablet po daily 30 tablet 4   Ascorbic Acid (VITAMIN C) 1000 MG tablet Take 1,000 mg by mouth in the morning.     loratadine (CLARITIN) 10 MG tablet Take 10 mg by mouth daily as needed for allergies.     pantoprazole (PROTONIX) 40 MG tablet Take 1 tablet by mouth once daily 90 tablet 1   No current facility-administered medications for this visit.    Allergies as of 08/13/2022   (No Known Allergies)    Family History  Problem Relation Age of Onset   Colon cancer Neg Hx    Colon polyps Neg Hx     Social History   Socioeconomic History   Marital status: Divorced    Spouse name: Not on file   Number of children: Not on file   Years of education: Not on file   Highest education level: Not on file  Occupational History   Not on file  Tobacco Use   Smoking status: Former   Smokeless tobacco: Current    Types: Chew  Vaping Use   Vaping Use: Never used  Substance and Sexual Activity   Alcohol use: Yes    Comment: 3-4 beer 4/7 days a week.   Drug use: Not Currently    Types: Marijuana    Comment: occas   Sexual activity: Not on file  Other Topics Concern   Not on file  Social History Narrative   [employer]. RACES CARS WITH NITRO. STEP DAUGHTER RACES AS WELL.   Social Determinants of Health   Financial Resource Strain: Not on file  Food Insecurity: Not on file   Transportation Needs: Not on file  Physical Activity: Not on file  Stress: Not on file  Social Connections: Not on file    Review of Systems: Gen: Denies fever, chills, cold or flulike symptoms, presyncope, syncope. CV: Denies chest pain, palpitations. Resp: Denies dyspnea, cough. GI: See HPI Heme: See HPI  Physical Exam: BP 139/84 (BP Location: Right Arm, Patient Position: Sitting, Cuff Size: Large)   Pulse 84   Temp 98.2 F (36.8 C) (Temporal)   Ht 6' (1.829 m)   Wt 261 lb 12.8 oz (118.8 kg)   SpO2 97%   BMI 35.51 kg/m  General:   Alert and oriented. No distress noted. Pleasant and cooperative.  Head:  Normocephalic and atraumatic. Eyes:  Conjuctiva clear without scleral icterus. Heart:  S1, S2 present without murmurs appreciated. Lungs:  Clear to auscultation bilaterally. No wheezes, rales, or rhonchi. No distress.  Abdomen:  +BS, soft, non-tender and non-distended. No rebound or guarding. No  HSM or masses noted. Msk:  Symmetrical without gross deformities. Normal posture. Extremities:  Without edema. Neurologic:  Alert and  oriented x4 Psych:  Normal mood and affect.    Assessment:  52 year old male with history of COPD, GERD, pyloric stenosis, adenomatous colon polyps including tubulovillous adenoma in 2011, presenting today at the request of Dr. Gerda Diss for hematochezia.  Patient reports 2 episodes of rectal bleeding with blood in the toilet water a couple weeks ago.  No recurrent symptoms.  Had some lower abdominal pain and back pain after this for couple of days.  No similar symptoms in the past though he has had chronic intermittent toilet tissue hematochezia. Denies rectal pain, NSAID use. He does have to strain at times to have a BM. Started fiber supplement 1 week ago which is already helping.  Last colonoscopy in November 2021 with internal hemorrhoids, two 6-8 mm polyps and two 3-5 mm polyps resected and retrieved retrieved.  Pathology with tubular adenomas and  hyperplastic polyps. Recent labs completed 08/11/2022 with hemoglobin 14.8, ferritin elevated at 524.  While rectal bleeding may very well be secondary to internal hemorrhoids, unable to rule out ischemic colitis, colon polyps, or malignancy.  As his last colonoscopy was almost 3 years ago, recommend that we go ahead and update this.    GERD: Well-controlled on pantoprazole 40 mg daily.  Bloating: Reports intermittent bloating for the last couple of years.  Patient thinks this is secondary to beer as he drinks 3-4 beer 4 out of 7 days a week.  Beer could be flaring GERD or known gastritis.  Recommend that he limit alcohol, use famotidine as needed, and continue to use Gas-X or Phazyme.   Plan:  Proceed with colonoscopy with propofol by Dr. Marletta Lor in near future. The risks, benefits, and alternatives have been discussed with the patient in detail. The patient states understanding and desires to proceed.  ASA 3 If recurrent rectal bleeding, can try using Preparation H twice daily for 5-7 days for possible hemorrhoidal bleeding. Continue pantoprazole 40 mg daily. May use famotidine as needed. Continue Gas-X or Phazyme. Reinforced GERD diet/lifestyle.  Separate instructions provided on AVS. Limit alcohol use. Follow-up after colonoscopy.   Ermalinda Memos, PA-C [facility] 08/13/2022

## 2022-08-12 LAB — CBC WITH DIFFERENTIAL/PLATELET
Basophils Absolute: 0 10*3/uL (ref 0.0–0.2)
Basos: 1 %
EOS (ABSOLUTE): 0.1 10*3/uL (ref 0.0–0.4)
Eos: 2 %
Hematocrit: 41.8 % (ref 37.5–51.0)
Hemoglobin: 14.8 g/dL (ref 13.0–17.7)
Immature Grans (Abs): 0 10*3/uL (ref 0.0–0.1)
Immature Granulocytes: 0 %
Lymphocytes Absolute: 1.4 10*3/uL (ref 0.7–3.1)
Lymphs: 36 %
MCH: 32.2 pg (ref 26.6–33.0)
MCHC: 35.4 g/dL (ref 31.5–35.7)
MCV: 91 fL (ref 79–97)
Monocytes Absolute: 0.5 10*3/uL (ref 0.1–0.9)
Monocytes: 13 %
Neutrophils Absolute: 1.9 10*3/uL (ref 1.4–7.0)
Neutrophils: 48 %
Platelets: 169 10*3/uL (ref 150–450)
RBC: 4.6 x10E6/uL (ref 4.14–5.80)
RDW: 12.5 % (ref 11.6–15.4)
WBC: 3.9 10*3/uL (ref 3.4–10.8)

## 2022-08-12 LAB — IRON,TIBC AND FERRITIN PANEL
Ferritin: 524 ng/mL — ABNORMAL HIGH (ref 30–400)
Iron Saturation: 28 % (ref 15–55)
Iron: 99 ug/dL (ref 38–169)
Total Iron Binding Capacity: 354 ug/dL (ref 250–450)
UIBC: 255 ug/dL (ref 111–343)

## 2022-08-13 ENCOUNTER — Ambulatory Visit: Payer: BC Managed Care – PPO | Admitting: Gastroenterology

## 2022-08-13 ENCOUNTER — Encounter: Payer: Self-pay | Admitting: *Deleted

## 2022-08-13 ENCOUNTER — Encounter: Payer: Self-pay | Admitting: Gastroenterology

## 2022-08-13 VITALS — BP 139/84 | HR 84 | Temp 98.2°F | Ht 72.0 in | Wt 261.8 lb

## 2022-08-13 DIAGNOSIS — R140 Abdominal distension (gaseous): Secondary | ICD-10-CM | POA: Diagnosis not present

## 2022-08-13 DIAGNOSIS — K219 Gastro-esophageal reflux disease without esophagitis: Secondary | ICD-10-CM | POA: Diagnosis not present

## 2022-08-13 DIAGNOSIS — K625 Hemorrhage of anus and rectum: Secondary | ICD-10-CM

## 2022-08-13 NOTE — Patient Instructions (Addendum)
We will arrange to have a colonoscopy in the near future with Dr. Marletta Lor.  If you have recurrent rectal bleeding, you can try using Preparation H twice daily for 5 to 7 days for possible hemorrhoidal bleeding.  Continue taking pantoprazole 40 mg daily for acid reflux.  For breakthrough heartburn, you can try famotidine over-the-counter.  Continue to use Gas-X or Phazyme for intermittent bloating.  Follow a GERD diet:  Avoid fried, fatty, greasy, spicy, citrus foods. Avoid caffeine and carbonated beverages. Avoid chocolate. Try eating 4-6 small meals a day rather than 3 large meals.  Limit alcohol use.   We will see you back after your colonoscopy.   It was good to see you today!   Ermalinda Memos, PA-C [facility]

## 2022-08-14 ENCOUNTER — Encounter: Payer: Self-pay | Admitting: *Deleted

## 2022-09-09 ENCOUNTER — Encounter (HOSPITAL_COMMUNITY): Payer: Self-pay

## 2022-09-09 ENCOUNTER — Encounter (HOSPITAL_COMMUNITY)
Admission: RE | Admit: 2022-09-09 | Discharge: 2022-09-09 | Disposition: A | Discharge status: Home / Self Care | Payer: BC Managed Care – PPO | Source: Ambulatory Visit | Attending: Internal Medicine | Admitting: Internal Medicine

## 2022-09-09 NOTE — Patient Instructions (Signed)
Jeffrey [name]  09/09/2022     @PREFPERIOPPHARMACY @   Your procedure is scheduled on  09/14/2022.   Report to [facility] at  0600 A.M.   Call this number if you have problems the morning of surgery:  [phone]  If you experience any cold or flu symptoms such as cough, fever, chills, shortness of breath, etc. between now and your scheduled surgery, please notify us at the above number.   Remember:  Follow the diet and prep instructions given to you by the office.     Take these medicines the morning of surgery with A SIP OF WATER                                allopurinol, pantoprazole.     Do not wear jewelry, make-up or nail polish, including gel polish,  artificial nails, or any other type of covering on natural nails (fingers and  toes).  Do not wear lotions, powders, or perfumes, or deodorant.  Do not shave 48 hours prior to surgery.  Men may shave face and neck.  Do not bring valuables to the hospital.  [facility] is not responsible for any belongings or valuables.  Contacts, dentures or bridgework may not be worn into surgery.  Leave your suitcase in the car.  After surgery it may be brought to your room.  For patients admitted to the hospital, discharge time will be determined by your treatment team.  Patients discharged the day of surgery will not be allowed to drive home and must have someone with them for 24 hours.    Special instructions:   DO NOT smoke tobacco or vape for 24 hours before your procedure.  Please read over the following fact sheets that you were given. Anesthesia Post-op Instructions and Care and Recovery After Surgery      Colonoscopy, Adult, Care After The following information offers guidance on how to care for yourself after your procedure. Your health care provider may also give you more specific instructions. If you have problems or questions, contact your health care provider. What can I expect after the  procedure? After the procedure, it is common to have: A small amount of blood in your stool for 24 hours after the procedure. Some gas. Mild cramping or bloating of your abdomen. Follow these instructions at home: Eating and drinking  Drink enough fluid to keep your urine pale yellow. Follow instructions from your health care provider about eating or drinking restrictions. Resume your normal diet as told by your health care provider. Avoid heavy or fried foods that are hard to digest. Activity Rest as told by your health care provider. Avoid sitting for a long time without moving. Get up to take short walks every 1-2 hours. This is important to improve blood flow and breathing. Ask for help if you feel weak or unsteady. Return to your normal activities as told by your health care provider. Ask your health care provider what activities are safe for you. Managing cramping and bloating  Try walking around when you have cramps or feel bloated. If directed, apply heat to your abdomen as told by your health care provider. Use the heat source that your health care provider recommends, such as a moist heat pack or a heating pad. Place a towel between your skin and the heat source. Leave the heat on for 20-30 minutes. Remove the heat  if your skin turns bright red. This is especially important if you are unable to feel pain, heat, or cold. You have a greater risk of getting burned. General instructions If you were given a sedative during the procedure, it can affect you for several hours. Do not drive or operate machinery until your health care provider says that it is safe. For the first 24 hours after the procedure: Do not sign important documents. Do not drink alcohol. Do your regular daily activities at a slower pace than normal. Eat soft foods that are easy to digest. Take over-the-counter and prescription medicines only as told by your health care provider. Keep all follow-up visits. This  is important. Contact a health care provider if: You have blood in your stool 2-3 days after the procedure. Get help right away if: You have more than a small spotting of blood in your stool. You have large blood clots in your stool. You have swelling of your abdomen. You have nausea or vomiting. You have a fever. You have increasing pain in your abdomen that is not relieved with medicine. These symptoms may be an emergency. Get help right away. Call 911. Do not wait to see if the symptoms will go away. Do not drive yourself to the hospital. Summary After the procedure, it is common to have a small amount of blood in your stool. You may also have mild cramping and bloating of your abdomen. If you were given a sedative during the procedure, it can affect you for several hours. Do not drive or operate machinery until your health care provider says that it is safe. Get help right away if you have a lot of blood in your stool, nausea or vomiting, a fever, or increased pain in your abdomen. This information is not intended to replace advice given to you by your health care provider. Make sure you discuss any questions you have with your health care provider. Document Revised: 04/07/2022 Document Reviewed: 10/16/2020 Elsevier Patient Education  2024 [employer] Monitored Anesthesia Care, Care After The following information offers guidance on how to care for yourself after your procedure. Your health care provider may also give you more specific instructions. If you have problems or questions, contact your health care provider. What can I expect after the procedure? After the procedure, it is common to have: Tiredness. Little or no memory about what happened during or after the procedure. Impaired judgment when it comes to making decisions. Nausea or vomiting. Some trouble with balance. Follow these instructions at home: For the time period you were told by your health care  provider:  Rest. Do not participate in activities where you could fall or become injured. Do not drive or use machinery. Do not drink alcohol. Do not take sleeping pills or medicines that cause drowsiness. Do not make important decisions or sign legal documents. Do not take care of children on your own. Medicines Take over-the-counter and prescription medicines only as told by your health care provider. If you were prescribed antibiotics, take them as told by your health care provider. Do not stop using the antibiotic even if you start to feel better. Eating and drinking Follow instructions from your health care provider about what you may eat and drink. Drink enough fluid to keep your urine pale yellow. If you vomit: Drink clear fluids slowly and in small amounts as you are able. Clear fluids include water, ice chips, low-calorie sports drinks, and fruit juice that has water added to it (  diluted fruit juice). Eat light and bland foods in small amounts as you are able. These foods include bananas, applesauce, rice, lean meats, toast, and crackers. General instructions  Have a responsible adult stay with you for the time you are told. It is important to have someone help care for you until you are awake and alert. If you have sleep apnea, surgery and some medicines can increase your risk for breathing problems. Follow instructions from your health care provider about wearing your sleep device: When you are sleeping. This includes during daytime naps. While taking prescription pain medicines, sleeping medicines, or medicines that make you drowsy. Do not use any products that contain nicotine or tobacco. These products include cigarettes, chewing tobacco, and vaping devices, such as e-cigarettes. If you need help quitting, ask your health care provider. Contact a health care provider if: You feel nauseous or vomit every time you eat or drink. You feel light-headed. You are still sleepy or  having trouble with balance after 24 hours. You get a rash. You have a fever. You have redness or swelling around the IV site. Get help right away if: You have trouble breathing. You have new confusion after you get home. These symptoms may be an emergency. Get help right away. Call 911. Do not wait to see if the symptoms will go away. Do not drive yourself to the hospital. This information is not intended to replace advice given to you by your health care provider. Make sure you discuss any questions you have with your health care provider. Document Revised: 07/21/2021 Document Reviewed: 07/21/2021 Elsevier Patient Education  2024 [employer].

## 2022-09-11 ENCOUNTER — Other Ambulatory Visit: Payer: Self-pay | Admitting: *Deleted

## 2022-09-11 MED ORDER — PEG 3350-KCL-NA BICARB-NACL 420 G PO SOLR: 4000.0000 mL | Freq: Once | ORAL | 0 refills | Status: AC

## 2022-09-14 ENCOUNTER — Ambulatory Visit (HOSPITAL_COMMUNITY): Payer: BC Managed Care – PPO | Admitting: Anesthesiology

## 2022-09-14 ENCOUNTER — Encounter (HOSPITAL_COMMUNITY)
Admission: RE | Disposition: A | Discharge status: Home / Self Care | Payer: Self-pay | Source: Home / Self Care | Attending: Internal Medicine

## 2022-09-14 ENCOUNTER — Ambulatory Visit (HOSPITAL_COMMUNITY)
Admission: RE | Admit: 2022-09-14 | Discharge: 2022-09-14 | Disposition: A | Discharge status: Home / Self Care | Payer: BC Managed Care – PPO | Attending: Internal Medicine | Admitting: Internal Medicine

## 2022-09-14 ENCOUNTER — Encounter (HOSPITAL_COMMUNITY): Payer: Self-pay

## 2022-09-14 DIAGNOSIS — K648 Other hemorrhoids: Secondary | ICD-10-CM | POA: Insufficient documentation

## 2022-09-14 DIAGNOSIS — J449 Chronic obstructive pulmonary disease, unspecified: Secondary | ICD-10-CM | POA: Insufficient documentation

## 2022-09-14 DIAGNOSIS — K625 Hemorrhage of anus and rectum: Secondary | ICD-10-CM | POA: Insufficient documentation

## 2022-09-14 DIAGNOSIS — D125 Benign neoplasm of sigmoid colon: Secondary | ICD-10-CM | POA: Insufficient documentation

## 2022-09-14 DIAGNOSIS — K219 Gastro-esophageal reflux disease without esophagitis: Secondary | ICD-10-CM | POA: Insufficient documentation

## 2022-09-14 DIAGNOSIS — K635 Polyp of colon: Secondary | ICD-10-CM | POA: Diagnosis not present

## 2022-09-14 DIAGNOSIS — Z87891 Personal history of nicotine dependence: Secondary | ICD-10-CM | POA: Insufficient documentation

## 2022-09-14 DIAGNOSIS — K5730 Diverticulosis of large intestine without perforation or abscess without bleeding: Secondary | ICD-10-CM | POA: Insufficient documentation

## 2022-09-14 DIAGNOSIS — D123 Benign neoplasm of transverse colon: Secondary | ICD-10-CM

## 2022-09-14 DIAGNOSIS — D128 Benign neoplasm of rectum: Secondary | ICD-10-CM | POA: Diagnosis not present

## 2022-09-14 DIAGNOSIS — D122 Benign neoplasm of ascending colon: Secondary | ICD-10-CM

## 2022-09-14 DIAGNOSIS — Z79899 Other long term (current) drug therapy: Secondary | ICD-10-CM | POA: Insufficient documentation

## 2022-09-14 DIAGNOSIS — D126 Benign neoplasm of colon, unspecified: Secondary | ICD-10-CM | POA: Insufficient documentation

## 2022-09-14 HISTORY — PX: COLONOSCOPY WITH PROPOFOL: SHX5780

## 2022-09-14 HISTORY — PX: POLYPECTOMY: SHX5525

## 2022-09-14 SURGERY — COLONOSCOPY WITH PROPOFOL
Anesthesia: General

## 2022-09-14 MED ORDER — LACTATED RINGERS IV SOLN: INTRAVENOUS | Status: DC

## 2022-09-14 MED ORDER — PROPOFOL 500 MG/50ML IV EMUL
INTRAVENOUS | Status: DC | PRN
  Administered 2022-09-14: 150 ug/kg/min via INTRAVENOUS

## 2022-09-14 MED ORDER — STERILE WATER FOR IRRIGATION IR SOLN
Status: DC | PRN
  Administered 2022-09-14: 120 mL

## 2022-09-14 MED ORDER — PROPOFOL 10 MG/ML IV BOLUS
INTRAVENOUS | Status: DC | PRN
  Administered 2022-09-14 (×2): 30 mg via INTRAVENOUS
  Administered 2022-09-14: 150 mg via INTRAVENOUS
  Administered 2022-09-14: 40 mg via INTRAVENOUS
  Administered 2022-09-14: 50 mg via INTRAVENOUS

## 2022-09-14 NOTE — H&P (Signed)
Primary Care Physician:  Babs Sciara, MD Primary Gastroenterologist:  Dr. Marletta Lor  Pre-Procedure History & Physical: HPI:  Jeffrey [name] is a 52 y.o. male is here for a colonoscopy for rectal bleeding  Past Medical History:  Diagnosis Date   COPD (chronic obstructive pulmonary disease) (HCC)    GERD (gastroesophageal reflux disease)     Past Surgical History:  Procedure Laterality Date   BALLOON DILATION N/A 08/19/2020   Procedure: BALLOON DILATION;  Surgeon: Lanelle Bal, DO;  [facility];  Service: Endoscopy;  Laterality: N/A;   BIOPSY  08/19/2020   Procedure: BIOPSY;  Surgeon: Lanelle Bal, DO;  [facility];  Service: Endoscopy;;   cardiac stress test     CHOLECYSTECTOMY     COLONOSCOPY N/A 09/19/2013   Procedure: COLONOSCOPY;  Surgeon: West Bali, MD;  [facility];  Service: Endoscopy;  normal TI, 1 colon polyp removed but not retrieved, mild sigmoid diverticulosis with associated luminal narrowing, moderate sized external hemorrhoids.  Recommended repeat colonoscopy in 5 years.    COLONOSCOPY  2011   Dr. Darrick Penna; 1.2 cm rectal tubulovillous adenoma    COLONOSCOPY WITH PROPOFOL N/A 01/09/2020   Procedure: COLONOSCOPY WITH PROPOFOL;  Surgeon: Lanelle Bal, DO;  [facility];  Service: Endoscopy;  Laterality: N/A;  1:30pm   ESOPHAGOGASTRODUODENOSCOPY N/A 09/19/2013   Procedure: ESOPHAGOGASTRODUODENOSCOPY (EGD);  Surgeon: West Bali, MD; normal-appearing esophagus s/p empiric dilation, mild erosive gastritis s/p biopsy, small stenosis transversable after dilation at the pylorus, normal examined duodenum.  Gastric biopsies with reactive/regenerative changes and focal intestinal metaplasia with no H. pylori, dysplasia, or malignancy.   ESOPHAGOGASTRODUODENOSCOPY (EGD) WITH PROPOFOL N/A 08/19/2020   Procedure: ESOPHAGOGASTRODUODENOSCOPY (EGD) WITH PROPOFOL;  Surgeon: Lanelle Bal, DO;  [facility];   Service: Endoscopy;  Laterality: N/A;  10:45am   ESOPHAGOGASTRODUODENOSCOPY ENDOSCOPY     MALONEY DILATION N/A 09/19/2013   Procedure: MALONEY DILATION;  Surgeon: West Bali, MD;  [facility];  Service: Endoscopy;  Laterality: N/A;   POLYPECTOMY  01/09/2020   Procedure: POLYPECTOMY;  Surgeon: Lanelle Bal, DO;  [facility];  Service: Endoscopy;;   POLYPECTOMY  08/19/2020   Procedure: POLYPECTOMY;  Surgeon: Lanelle Bal, DO;  [facility];  Service: Endoscopy;;   SAVORY DILATION N/A 09/19/2013   Procedure: SAVORY DILATION;  Surgeon: West Bali, MD;  [facility];  Service: Endoscopy;  Laterality: N/A;    Prior to Admission medications   Medication Sig Start Date End Date Taking? Authorizing Provider  albuterol (VENTOLIN HFA) 108 (90 Base) MCG/ACT inhaler INHALE 2 PUFFS BY MOUTH EVERY 4 HOURS AS NEEDED FOR WHEEZING OR SHORTNESS OF BREATH 06/27/21  Yes Babs Sciara, MD  allopurinol (ZYLOPRIM) 100 MG tablet Take one tablet po daily 04/20/22  Yes Luking, Jonna Coup, MD  Ascorbic Acid (VITAMIN C) 1000 MG tablet Take 1,000 mg by mouth in the morning.   Yes [provider]  loratadine (CLARITIN) 10 MG tablet Take 10 mg by mouth daily as needed for allergies.   Yes [provider]  pantoprazole (PROTONIX) 40 MG tablet Take 1 tablet by mouth once daily 07/02/22  Yes Luking, Jonna Coup, MD    Allergies as of 08/13/2022   (No Known Allergies)    Family History  Problem Relation Age of Onset   Colon cancer Neg Hx    Colon polyps Neg Hx  Social History   Socioeconomic History   Marital status: Divorced    Spouse name: Not on file   Number of children: Not on file   Years of education: Not on file   Highest education level: Not on file  Occupational History   Not on file  Tobacco Use   Smoking status: Former    Packs/day: 2.00    Years: 15.00    Additional pack years: 0.00    Total pack years: 30.00    Types:  Cigarettes    Quit date: 09/14/2007    Years since quitting: 15.0   Smokeless tobacco: Current    Types: Snuff  Vaping Use   Vaping Use: Never used  Substance and Sexual Activity   Alcohol use: Yes    Comment: 3-4 beer 4/7 days a week.   Drug use: Not Currently   Sexual activity: Yes  Other Topics Concern   Not on file  Social History Narrative   OW[employer]. RACES CARS WITH NITRO. STEP DAUGHTER RACES AS WELL.   Social Determinants of Health   Financial Resource Strain: Not on file  Food Insecurity: Not on file  Transportation Needs: Not on file  Physical Activity: Not on file  Stress: Not on file  Social Connections: Not on file  Intimate Partner Violence: Not on file    Review of Systems: See HPI, otherwise negative ROS  Physical Exam: Vital signs in last 24 hours: Temp:  [98.8 F (37.1 C)] 98.8 F (37.1 C) (07/08 0653) Pulse Rate:  [71] 71 (07/08 0653) Resp:  [18] 18 (07/08 0653) BP: (128)/(97) 128/97 (07/08 0653) SpO2:  [97 %] 97 % (07/08 0653) Weight:  [118.8 kg] 118.8 kg (07/08 0653)   General:   Alert,  Well-developed, well-nourished, pleasant and cooperative in NAD Head:  Normocephalic and atraumatic. Eyes:  Sclera clear, no icterus.   Conjunctiva pink. Ears:  Normal auditory acuity. Nose:  No deformity, discharge,  or lesions. Msk:  Symmetrical without gross deformities. Normal posture. Extremities:  Without clubbing or edema. Neurologic:  Alert and  oriented x4;  grossly normal neurologically. Skin:  Intact without significant lesions or rashes. Psych:  Alert and cooperative. Normal mood and affect.  Impression/Plan: Jeffrey [name] is here for a colonoscopy to be performed for rectal bleeding  The risks of the procedure including infection, bleed, or perforation as well as benefits, limitations, alternatives and imponderables have been reviewed with the patient. Questions have been answered. All parties agreeable.

## 2022-09-14 NOTE — Op Note (Signed)
[facility] Patient Name: Jeffrey [name] Procedure Date: 09/14/2022 7:00 AM MRN: [id number] Date of Birth: [birth year] Attending MD: Hennie Duos. Marletta Lor , [place], [id number] CSN: [id number] Age: 52 Admit Type: Outpatient Procedure:                Colonoscopy Indications:              Rectal bleeding Providers:                Hennie Duos. Marletta Lor, DO, Sheran Fava, Pandora Leiter, Technician Referring MD:              Medicines:                See the Anesthesia note for documentation of the                            administered medications Complications:            No immediate complications. Estimated Blood Loss:     Estimated blood loss was minimal. Procedure:                Pre-Anesthesia Assessment:                           - The anesthesia plan was to use monitored                            anesthesia care (MAC).                           After obtaining informed consent, the colonoscope                            was passed under direct vision. Throughout the                            procedure, the patient's blood pressure, pulse, and                            oxygen saturations were monitored continuously. The                            [id number]-HQ190L ([id number]) scope was introduced through                            the anus and advanced to the the cecum, identified                            by appendiceal orifice and ileocecal valve. The                            colonoscopy was performed without difficulty. The                            patient tolerated the procedure well. The quality  of the bowel preparation was evaluated using the                            BBPS [facility] Bowel Preparation Scale) with scores                            of: Right Colon = 2 (minor amount of residual                            staining, small fragments of stool and/or opaque                            liquid, but mucosa seen well),  Transverse Colon = 3                            (entire mucosa seen well with no residual staining,                            small fragments of stool or opaque liquid) and Left                            Colon = 3 (entire mucosa seen well with no residual                            staining, small fragments of stool or opaque                            liquid). The total BBPS score equals 8. The quality                            of the bowel preparation was good. Scope In: 7:39:00 AM Scope Out: 7:56:38 AM Scope Withdrawal Time: 0 hours 15 minutes 25 seconds  Total Procedure Duration: 0 hours 17 minutes 38 seconds  Findings:      Non-bleeding internal hemorrhoids were found during retroflexion.      A few small-mouthed diverticula were found in the sigmoid colon.      Two sessile polyps were found in the transverse colon and ascending       colon. The polyps were 3 to 4 mm in size. These polyps were removed with       a cold snare. Resection and retrieval were complete.      Two sessile polyps were found in the rectum and sigmoid colon. The       polyps were 3 to 5 mm in size. These polyps were removed with a cold       snare. Resection and retrieval were complete.      The exam was otherwise without abnormality. Impression:               - Non-bleeding internal hemorrhoids.                           - Diverticulosis in the sigmoid colon.                           -  Two 3 to 4 mm polyps in the transverse colon and                            in the ascending colon, removed with a cold snare.                            Resected and retrieved.                           - Two 3 to 5 mm polyps in the rectum and in the                            sigmoid colon, removed with a cold snare. Resected                            and retrieved.                           - The examination was otherwise normal. Moderate Sedation:      Per Anesthesia Care Recommendation:           - Patient has a  contact number available for                            emergencies. The signs and symptoms of potential                            delayed complications were discussed with the                            patient. Return to normal activities tomorrow.                            Written discharge instructions were provided to the                            patient.                           - Resume previous diet.                           - Continue present medications.                           - Await pathology results.                           - Repeat colonoscopy in 5 years for surveillance.                           - Return to GI clinic in 3 months.                           - Use fiber, for example Citrucel, Fibercon, [employer]  or Metamucil.                           - Limit toilet time for BMs to <5 min Procedure Code(s):        --- Professional ---                           [phone], Colonoscopy, flexible; with removal of                            tumor(s), polyp(s), or other lesion(s) by snare                            technique Diagnosis Code(s):        --- Professional ---                           K64.8, Other hemorrhoids                           D12.3, Benign neoplasm of transverse colon (hepatic                            flexure or splenic flexure)                           D12.2, Benign neoplasm of ascending colon                           D12.8, Benign neoplasm of rectum                           D12.5, Benign neoplasm of sigmoid colon                           K62.5, Hemorrhage of anus and rectum                           K57.30, Diverticulosis of large intestine without                            perforation or abscess without bleeding CPT copyright 2022 American Medical Association. All rights reserved. The codes documented in this report are preliminary and upon coder review may  be revised to meet current compliance requirements. Hennie Duos. Marletta Lor, DO Hennie Duos. Amaru Burroughs, DO 09/14/2022 8:01:00 AM This report has been signed electronically. Number of Addenda: 0

## 2022-09-14 NOTE — Transfer of Care (Signed)
Immediate Anesthesia Transfer of Care Note  Patient: Jeffrey [name]  Procedure(s) Performed: COLONOSCOPY WITH PROPOFOL POLYPECTOMY  Patient Location: Short Stay  Anesthesia Type:General  Level of Consciousness: awake, alert , and oriented  Airway & Oxygen Therapy: Patient Spontanous Breathing  Post-op Assessment: Report given to RN and Post -op Vital signs reviewed and stable  Post vital signs: Reviewed and stable  Last Vitals:  Vitals Value Taken Time  BP 109/69 09/14/22 0800  Temp 36.5 C 09/14/22 0800  Pulse 78 09/14/22 0800  Resp 20 09/14/22 0800  SpO2 100 % 09/14/22 0800    Last Pain:  Vitals:   09/14/22 0800  TempSrc: Oral  PainSc: 0-No pain      Patients Stated Pain Goal: 6 (09/14/22 0454)  Complications: No notable events documented.

## 2022-09-14 NOTE — Discharge Instructions (Addendum)
  Colonoscopy Discharge Instructions  Read the instructions outlined below and refer to this sheet in the next few weeks. These discharge instructions provide you with general information on caring for yourself after you leave the hospital. Your doctor may also give you specific instructions. While your treatment has been planned according to the most current medical practices available, unavoidable complications occasionally occur.   ACTIVITY You may resume your regular activity, but move at a slower pace for the next 24 hours.  Take frequent rest periods for the next 24 hours.  Walking will help get rid of the air and reduce the bloated feeling in your belly (abdomen).  No driving for 24 hours (because of the medicine (anesthesia) used during the test).   Do not sign any important legal documents or operate any machinery for 24 hours (because of the anesthesia used during the test).  NUTRITION Drink plenty of fluids.  You may resume your normal diet as instructed by your doctor.  Begin with a light meal and progress to your normal diet. Heavy or fried foods are harder to digest and may make you feel sick to your stomach (nauseated).  Avoid alcoholic beverages for 24 hours or as instructed.  MEDICATIONS You may resume your normal medications unless your doctor tells you otherwise.  WHAT YOU CAN EXPECT TODAY Some feelings of bloating in the abdomen.  Passage of more gas than usual.  Spotting of blood in your stool or on the toilet paper.  IF YOU HAD POLYPS REMOVED DURING THE COLONOSCOPY: No aspirin products for 7 days or as instructed.  No alcohol for 7 days or as instructed.  Eat a soft diet for the next 24 hours.  FINDING OUT THE RESULTS OF YOUR TEST Not all test results are available during your visit. If your test results are not back during the visit, make an appointment with your caregiver to find out the results. Do not assume everything is normal if you have not heard from your  caregiver or the medical facility. It is important for you to follow up on all of your test results.  SEEK IMMEDIATE MEDICAL ATTENTION IF: You have more than a spotting of blood in your stool.  Your belly is swollen (abdominal distention).  You are nauseated or vomiting.  You have a temperature over 101.  You have abdominal pain or discomfort that is severe or gets worse throughout the day.   Your colonoscopy revealed 4 small polyp(s) which I removed successfully. Await pathology results, my office will contact you. I recommend repeating colonoscopy in 5 years for surveillance purposes.   You also have diverticulosis and internal hemorrhoids. I would recommend increasing fiber in your diet or adding OTC Benefiber/Metamucil. Be sure to drink at least 6 glasses of water daily.   Limit toilet time for bowel movements  to <5 min  Follow-up with GI in 3-4 months.  I hope you have a great rest of your week!  Hennie Duos. Marletta Lor, D.O. Gastroenterology and Hepatology [facility]

## 2022-09-14 NOTE — Anesthesia Preprocedure Evaluation (Signed)
Anesthesia Evaluation  Patient identified by MRN, date of birth, ID band Patient awake    Reviewed: Allergy & Precautions, H&P , NPO status , Patient's Chart, lab work & pertinent test results  Airway Mallampati: III  TM Distance: >3 FB Neck ROM: Full    Dental  (+) Teeth Intact, Dental Advisory Given   Pulmonary COPD,  COPD inhaler, former smoker   Pulmonary exam normal breath sounds clear to auscultation       Cardiovascular negative cardio ROS Normal cardiovascular exam Rhythm:Regular Rate:Normal     Neuro/Psych negative neurological ROS  negative psych ROS   GI/Hepatic Neg liver ROS,GERD  Medicated and Controlled,,  Endo/Other  negative endocrine ROS    Renal/GU negative Renal ROS  negative genitourinary   Musculoskeletal negative musculoskeletal ROS (+)    Abdominal   Peds negative pediatric ROS (+)  Hematology negative hematology ROS (+)   Anesthesia Other Findings   Reproductive/Obstetrics negative OB ROS                             Anesthesia Physical Anesthesia Plan  ASA: 2  Anesthesia Plan: General   Post-op Pain Management: Minimal or no pain anticipated   Induction: Intravenous  PONV Risk Score and Plan: 1 and Propofol infusion  Airway Management Planned: Nasal Cannula, Natural Airway and Simple Face Mask  Additional Equipment:   Intra-op Plan:   Post-operative Plan:   Informed Consent: I have reviewed the patients History and Physical, chart, labs and discussed the procedure including the risks, benefits and alternatives for the proposed anesthesia with the patient or authorized representative who has indicated his/her understanding and acceptance.     Dental advisory given  Plan Discussed with: CRNA and Surgeon  Anesthesia Plan Comments:         Anesthesia Quick Evaluation

## 2022-09-14 NOTE — Anesthesia Postprocedure Evaluation (Signed)
Anesthesia Post Note  Patient: Jeffrey [name]  Procedure(s) Performed: COLONOSCOPY WITH PROPOFOL POLYPECTOMY  Patient location during evaluation: Phase II Anesthesia Type: General Level of consciousness: awake and alert and oriented Pain management: pain level controlled Vital Signs Assessment: post-procedure vital signs reviewed and stable Respiratory status: spontaneous breathing, nonlabored ventilation and respiratory function stable Cardiovascular status: blood pressure returned to baseline and stable Postop Assessment: no apparent nausea or vomiting Anesthetic complications: no  No notable events documented.   Last Vitals:  Vitals:   09/14/22 0653 09/14/22 0800  BP: (!) 128/97 109/69  Pulse: 71 78  Resp: 18 20  Temp: 37.1 C 36.5 C  SpO2: 97% 100%    Last Pain:  Vitals:   09/14/22 0800  TempSrc: Oral  PainSc: 0-No pain                 Cerissa Zeiger C Javid Kemler

## 2022-09-17 LAB — SURGICAL PATHOLOGY

## 2022-09-18 ENCOUNTER — Encounter (HOSPITAL_COMMUNITY): Payer: Self-pay | Admitting: Internal Medicine

## 2022-10-01 DIAGNOSIS — K635 Polyp of colon: Secondary | ICD-10-CM | POA: Diagnosis not present

## 2022-10-01 DIAGNOSIS — Z1331 Encounter for screening for depression: Secondary | ICD-10-CM | POA: Diagnosis not present

## 2022-10-01 DIAGNOSIS — E782 Mixed hyperlipidemia: Secondary | ICD-10-CM | POA: Diagnosis not present

## 2022-10-01 DIAGNOSIS — Z0000 Encounter for general adult medical examination without abnormal findings: Secondary | ICD-10-CM | POA: Diagnosis not present

## 2022-10-01 DIAGNOSIS — E6609 Other obesity due to excess calories: Secondary | ICD-10-CM | POA: Diagnosis not present

## 2022-10-01 DIAGNOSIS — Z6835 Body mass index (BMI) 35.0-35.9, adult: Secondary | ICD-10-CM | POA: Diagnosis not present

## 2022-11-16 ENCOUNTER — Encounter: Payer: Self-pay | Admitting: Gastroenterology

## 2022-11-16 ENCOUNTER — Ambulatory Visit: Payer: BC Managed Care – PPO | Admitting: Family Medicine

## 2022-11-24 ENCOUNTER — Other Ambulatory Visit: Payer: Self-pay | Admitting: Family Medicine

## 2022-12-02 DIAGNOSIS — Z6838 Body mass index (BMI) 38.0-38.9, adult: Secondary | ICD-10-CM | POA: Diagnosis not present

## 2022-12-02 DIAGNOSIS — E669 Obesity, unspecified: Secondary | ICD-10-CM | POA: Diagnosis not present

## 2022-12-02 DIAGNOSIS — J019 Acute sinusitis, unspecified: Secondary | ICD-10-CM | POA: Diagnosis not present

## 2022-12-02 DIAGNOSIS — U071 COVID-19: Secondary | ICD-10-CM | POA: Diagnosis not present

## 2022-12-24 ENCOUNTER — Other Ambulatory Visit: Payer: Self-pay | Admitting: Family Medicine

## 2022-12-28 ENCOUNTER — Other Ambulatory Visit: Payer: Self-pay | Admitting: Family Medicine

## 2022-12-28 DIAGNOSIS — Z13220 Encounter for screening for lipoid disorders: Secondary | ICD-10-CM | POA: Diagnosis not present

## 2022-12-28 DIAGNOSIS — Z0000 Encounter for general adult medical examination without abnormal findings: Secondary | ICD-10-CM | POA: Diagnosis not present

## 2022-12-28 DIAGNOSIS — Z79899 Other long term (current) drug therapy: Secondary | ICD-10-CM | POA: Diagnosis not present

## 2022-12-29 ENCOUNTER — Encounter: Payer: Self-pay | Admitting: Family Medicine

## 2022-12-29 ENCOUNTER — Ambulatory Visit: Payer: BC Managed Care – PPO | Admitting: Family Medicine

## 2022-12-29 VITALS — BP 136/86 | HR 81 | Wt 270.6 lb

## 2022-12-29 DIAGNOSIS — E785 Hyperlipidemia, unspecified: Secondary | ICD-10-CM

## 2022-12-29 DIAGNOSIS — R42 Dizziness and giddiness: Secondary | ICD-10-CM

## 2022-12-29 DIAGNOSIS — M10072 Idiopathic gout, left ankle and foot: Secondary | ICD-10-CM | POA: Diagnosis not present

## 2022-12-29 DIAGNOSIS — F411 Generalized anxiety disorder: Secondary | ICD-10-CM | POA: Diagnosis not present

## 2022-12-29 DIAGNOSIS — Z125 Encounter for screening for malignant neoplasm of prostate: Secondary | ICD-10-CM

## 2022-12-29 DIAGNOSIS — N529 Male erectile dysfunction, unspecified: Secondary | ICD-10-CM

## 2022-12-29 LAB — LIPID PANEL
Chol/HDL Ratio: 7 ratio — ABNORMAL HIGH (ref 0.0–5.0)
Cholesterol, Total: 266 mg/dL — ABNORMAL HIGH (ref 100–199)
HDL: 38 mg/dL — ABNORMAL LOW (ref >39)
LDL Chol Calc (NIH): 124 mg/dL — ABNORMAL HIGH (ref 0–99)
Triglycerides: 573 mg/dL (ref 0–149)
VLDL Cholesterol Cal: 104 mg/dL — ABNORMAL HIGH (ref 5–40)

## 2022-12-29 LAB — HEPATIC FUNCTION PANEL
ALT: 37 [IU]/L (ref 0–44)
AST: 26 [IU]/L (ref 0–40)
Albumin: 4.8 g/dL (ref 4.1–5.1)
Alkaline Phosphatase: 85 [IU]/L (ref 44–121)
Bilirubin Total: 0.3 mg/dL (ref 0.0–1.2)
Bilirubin, Direct: 0.1 mg/dL (ref 0.00–0.40)
Total Protein: 7.4 g/dL (ref 6.0–8.5)

## 2022-12-29 LAB — BASIC METABOLIC PANEL
BUN/Creatinine Ratio: 10 (ref 9–20)
BUN: 9 mg/dL (ref 6–24)
CO2: 25 mmol/L (ref 20–29)
Calcium: 9.7 mg/dL (ref 8.7–10.2)
Chloride: 101 mmol/L (ref 96–106)
Creatinine, Ser: 0.93 mg/dL (ref 0.76–1.27)
Glucose: 91 mg/dL (ref 70–99)
Potassium: 4.8 mmol/L (ref 3.5–5.2)
Sodium: 140 mmol/L (ref 134–144)
eGFR: 100 mL/min/{1.73_m2} (ref >59)

## 2022-12-29 MED ORDER — SILDENAFIL CITRATE 100 MG PO TABS: 50.0000 mg | ORAL_TABLET | Freq: Every day | ORAL | 11 refills | Status: DC | PRN

## 2022-12-29 MED ORDER — PANTOPRAZOLE SODIUM 40 MG PO TBEC: 40.0000 mg | DELAYED_RELEASE_TABLET | Freq: Every day | ORAL | 0 refills | Status: DC

## 2022-12-29 NOTE — Progress Notes (Signed)
   Subjective:    Patient ID: Jeffrey [name], male    DOB: [birth year], 52 y.o.   MRN: [id number]  Discussed the use of AI scribe software for clinical note transcription with the patient, who gave verbal consent to proceed.  History of Present Illness   The patient, with a history of vertigo, presents with recurrent episodes of dizziness for the past two months. The dizziness is triggered by certain movements such as bending over or rolling over in bed. During these episodes, the patient describes a sensation of near fainting but denies any associated visual changes or nausea. There is no correlation between the dizziness and standing up from a seated position or exertion at work. The patient manages these episodes by continuing with his activities or holding still until the sensation passes.  The patient also reports chronic neck pain and occasional headaches, which he believes are related to his neck issues. Despite these symptoms, the patient denies any numbness or weakness on either side of the body.  The patient has a history of gout and takes medication as needed when he feels pain. He also takes a daily acid blocker. He has been diagnosed with COPD but denies any shortness of breath during work or strenuous activities.  The patient acknowledges a lack of exercise due to a busy schedule and admits to poor dietary habits, often eating on the run or having late dinners. He expresses a desire to return to the gym and improve his diet but struggles with motivation.  The patient also reports long-standing issues with sexual function, which have led to frustration and a decrease in sexual relations over the past few years. Previous attempts at treatment with Viagra resulted in unpleasant side effects.  The patient admits to daily alcohol consumption, typically six to eight beers in the evening, and recognizes this as a potential contributor to his health issues. He expresses a willingness to  reduce his alcohol intake and make other necessary lifestyle changes to improve his health.         Review of Systems     Objective:    Physical Exam   VITALS: BP- 136/86 CHEST: Lungs clear to auscultation. CARDIOVASCULAR: Heart sounds normal.           Assessment & Plan:  Assessment and Plan        1. Dizziness On physical exam no sign of a stroke This has the findings more of in her ear where it is triggered by rolling over or sometimes bending over I would recommend postural changes to be slow otherwise should do fine if ongoing troubles or problems let us know  2. Acute idiopathic gout involving toe of left foot We will check uric acid level before next visit  3. Hyperlipidemia, unspecified hyperlipidemia type Very important for him to taper down alcohol I encouraged him to cut down to no more than a couple beers during the day on the weekdays and 3 or 4 on the weekends might be difficult for him to do but he is going to try  4. GAD (generalized anxiety disorder) He does get stressed out but he states he is doing the best he can to manage it no medications indicated currently  5. Screening PSA (prostate specific antigen) Screening before next visit  6. Erectile dysfunction, unspecified erectile dysfunction type Viagra prescribed side effects discussed if ongoing troubles may need further intervention partner perhaps even urology check testosterone level

## 2023-01-11 ENCOUNTER — Other Ambulatory Visit: Payer: Self-pay

## 2023-01-11 DIAGNOSIS — Z125 Encounter for screening for malignant neoplasm of prostate: Secondary | ICD-10-CM

## 2023-01-11 DIAGNOSIS — N529 Male erectile dysfunction, unspecified: Secondary | ICD-10-CM

## 2023-01-11 DIAGNOSIS — E785 Hyperlipidemia, unspecified: Secondary | ICD-10-CM

## 2023-01-11 DIAGNOSIS — Z8739 Personal history of other diseases of the musculoskeletal system and connective tissue: Secondary | ICD-10-CM

## 2023-01-14 NOTE — Progress Notes (Deleted)
Referring Provider: Babs Sciara, MD Primary Care Physician:  Babs Sciara, MD Primary GI Physician: Dr. Bonnetta Barry chief complaint on file.   HPI:   Jeffrey [name] is a 52 y.o. male with history of GERD, pyloric stenosis s/p dilation in 2022, adenomatous colon polyps including tubulovillous adenoma in 2011, presenting today for follow-up of rectal bleeding s/p colonoscopy.  Last seen in our office 08/13/2022 at the request of Dr. Gerda Diss for rectal bleeding.  He reported 2 occurrences a few weeks ago where he passed blood per rectum.  Reported history of occasional toilet tissue hematochezia for years, but nothing like the bleeding he had a couple weeks ago.  Noted that he had to strain to have a bowel movement, but he has started a fiber supplement about 1 week ago which was already helping.  Denied rectal pain.  He did have a little soreness for couple days below the belt line in his back after rectal bleeding occurred.  He was scheduled for colonoscopy.  Colonoscopy 09/14/2022 with nonbleeding internal hemorrhoids, few small mouth diverticula in the sigmoid colon, two 3-4 mm polyps in the transverse colon resected and retrieved, two 3-5 mm polyps in the rectum and sigmoid colon resected and retrieved.  Pathology with tubular adenomas and hyperplastic polyp.  Recommended 5-year surveillance.    Past Medical History:  Diagnosis Date   COPD (chronic obstructive pulmonary disease) (HCC)    GERD (gastroesophageal reflux disease)     Past Surgical History:  Procedure Laterality Date   BALLOON DILATION N/A 08/19/2020   Procedure: BALLOON DILATION;  Surgeon: Lanelle Bal, DO;  [facility];  Service: Endoscopy;  Laterality: N/A;   BIOPSY  08/19/2020   Procedure: BIOPSY;  Surgeon: Lanelle Bal, DO;  [facility];  Service: Endoscopy;;   cardiac stress test     CHOLECYSTECTOMY     COLONOSCOPY N/A 09/19/2013   Procedure: COLONOSCOPY;  Surgeon: West Bali, MD;  [facility];  Service: Endoscopy;  normal TI, 1 colon polyp removed but not retrieved, mild sigmoid diverticulosis with associated luminal narrowing, moderate sized external hemorrhoids.  Recommended repeat colonoscopy in 5 years.    COLONOSCOPY  2011   Dr. Darrick Penna; 1.2 cm rectal tubulovillous adenoma    COLONOSCOPY WITH PROPOFOL N/A 01/09/2020   Procedure: COLONOSCOPY WITH PROPOFOL;  Surgeon: Lanelle Bal, DO;  [facility];  Service: Endoscopy;  Laterality: N/A;  1:30pm   COLONOSCOPY WITH PROPOFOL N/A 09/14/2022   Procedure: COLONOSCOPY WITH PROPOFOL;  Surgeon: Lanelle Bal, DO;  [facility];  Service: Endoscopy;  Laterality: N/A;  730am, asa 3   ESOPHAGOGASTRODUODENOSCOPY N/A 09/19/2013   Procedure: ESOPHAGOGASTRODUODENOSCOPY (EGD);  Surgeon: West Bali, MD; normal-appearing esophagus s/p empiric dilation, mild erosive gastritis s/p biopsy, small stenosis transversable after dilation at the pylorus, normal examined duodenum.  Gastric biopsies with reactive/regenerative changes and focal intestinal metaplasia with no H. pylori, dysplasia, or malignancy.   ESOPHAGOGASTRODUODENOSCOPY (EGD) WITH PROPOFOL N/A 08/19/2020   Procedure: ESOPHAGOGASTRODUODENOSCOPY (EGD) WITH PROPOFOL;  Surgeon: Lanelle Bal, DO;  [facility];  Service: Endoscopy;  Laterality: N/A;  10:45am   ESOPHAGOGASTRODUODENOSCOPY ENDOSCOPY     MALONEY DILATION N/A 09/19/2013   Procedure: MALONEY DILATION;  Surgeon: West Bali, MD;  [facility];  Service: Endoscopy;  Laterality: N/A;   POLYPECTOMY  01/09/2020   Procedure: POLYPECTOMY;  Surgeon: Lanelle Bal, DO;  [facility];  Service: Endoscopy;;   POLYPECTOMY  08/19/2020   Procedure: POLYPECTOMY;  Surgeon: Lanelle Bal, DO;  [facility];  Service: Endoscopy;;   POLYPECTOMY  09/14/2022   Procedure: POLYPECTOMY;  Surgeon: Lanelle Bal, DO;  [facility];  Service: Endoscopy;;   SAVORY DILATION N/A 09/19/2013   Procedure: SAVORY DILATION;  Surgeon: West Bali, MD;  [facility];  Service: Endoscopy;  Laterality: N/A;    Current Outpatient Medications  Medication Sig Dispense Refill   albuterol (VENTOLIN HFA) 108 (90 Base) MCG/ACT inhaler INHALE 2 PUFFS BY MOUTH EVERY 4 HOURS AS NEEDED FOR WHEEZING FOR SHORTNESS OF BREATH 9 g 0   allopurinol (ZYLOPRIM) 100 MG tablet Take one tablet po daily 30 tablet 4   Ascorbic Acid (VITAMIN C) 1000 MG tablet Take 1,000 mg by mouth in the morning.     loratadine (CLARITIN) 10 MG tablet Take 10 mg by mouth daily as needed for allergies.     pantoprazole (PROTONIX) 40 MG tablet Take 1 tablet (40 mg total) by mouth daily. 90 tablet 0   sildenafil (VIAGRA) 100 MG tablet Take 0.5-1 tablets (50-100 mg total) by mouth daily as needed for erectile dysfunction. 5 tablet 11   No current facility-administered medications for this visit.    Allergies as of 01/15/2023   (No Known Allergies)    Family History  Problem Relation Age of Onset   Colon cancer Neg Hx    Colon polyps Neg Hx     Social History   Socioeconomic History   Marital status: Divorced    Spouse name: Not on file   Number of children: Not on file   Years of education: Not on file   Highest education level: Not on file  Occupational History   Not on file  Tobacco Use   Smoking status: Former    Current packs/day: 0.00    Average packs/day: 2.0 packs/day for 15.0 years (30.0 ttl pk-yrs)    Types: Cigarettes    Start date: 09/13/1992    Quit date: 09/14/2007    Years since quitting: 15.3   Smokeless tobacco: Current    Types: Snuff  Vaping Use   Vaping status: Never Used  Substance and Sexual Activity   Alcohol use: Yes    Comment: 3-4 beer 4/7 days a week.   Drug use: Not Currently   Sexual activity: Yes  Other Topics Concern   Not on file  Social History Narrative   [employer]. 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   Social Determinants of Health   Financial Resource Strain: Not on file  Food Insecurity: Not on file  Transportation Needs: Not on file  Physical Activity: Not on file  Stress: Not on file  Social Connections: Not on file    Review of Systems: Gen: Denies fever, chills, anorexia. Denies fatigue, weakness, weight loss.  CV: Denies chest pain, palpitations, syncope, peripheral edema, and claudication. Resp: Denies dyspnea at rest, cough, wheezing, coughing up blood, and pleurisy. GI: Denies vomiting blood, jaundice, and fecal incontinence.   Denies dysphagia or odynophagia. Derm: Denies rash, itching, dry skin Psych: Denies depression, anxiety, memory loss, confusion. No homicidal or suicidal ideation.  Heme: Denies bruising, bleeding, and enlarged lymph nodes.  Physical Exam: There were no vitals taken for this visit. General:   Alert and oriented. No distress noted. Pleasant and cooperative.  Head:  Normocephalic and atraumatic. Eyes:  Conjuctiva clear without scleral icterus. Heart:  S1, S2 present without murmurs appreciated. Lungs:  Clear to auscultation bilaterally. No wheezes, rales, or rhonchi. No distress.  Abdomen:  +BS, soft, non-tender and non-distended. No rebound or guarding. No HSM or masses noted. Msk:  Symmetrical without gross deformities. Normal posture. Extremities:  Without edema. Neurologic:  Alert and  oriented x4 Psych:  Normal mood and affect.    Assessment:     Plan:  ***   Ermalinda Memos, PA-C [facility] 01/15/2023

## 2023-01-15 ENCOUNTER — Encounter: Payer: Self-pay | Admitting: Gastroenterology

## 2023-01-15 ENCOUNTER — Ambulatory Visit: Payer: BC Managed Care – PPO | Admitting: Gastroenterology

## 2023-02-17 ENCOUNTER — Encounter: Payer: Self-pay | Admitting: Family Medicine

## 2023-03-12 ENCOUNTER — Other Ambulatory Visit: Payer: Self-pay | Admitting: Family Medicine

## 2023-04-30 ENCOUNTER — Ambulatory Visit: Payer: BC Managed Care – PPO | Admitting: Family Medicine

## 2023-05-11 DIAGNOSIS — J029 Acute pharyngitis, unspecified: Secondary | ICD-10-CM | POA: Diagnosis not present

## 2023-05-11 DIAGNOSIS — J329 Chronic sinusitis, unspecified: Secondary | ICD-10-CM | POA: Diagnosis not present

## 2023-05-11 DIAGNOSIS — H6692 Otitis media, unspecified, left ear: Secondary | ICD-10-CM | POA: Diagnosis not present

## 2023-05-11 DIAGNOSIS — R030 Elevated blood-pressure reading, without diagnosis of hypertension: Secondary | ICD-10-CM | POA: Diagnosis not present

## 2023-07-20 ENCOUNTER — Other Ambulatory Visit: Payer: Self-pay | Admitting: Family Medicine

## 2023-08-05 DIAGNOSIS — Z8739 Personal history of other diseases of the musculoskeletal system and connective tissue: Secondary | ICD-10-CM | POA: Diagnosis not present

## 2023-08-05 DIAGNOSIS — N529 Male erectile dysfunction, unspecified: Secondary | ICD-10-CM | POA: Diagnosis not present

## 2023-08-05 DIAGNOSIS — Z125 Encounter for screening for malignant neoplasm of prostate: Secondary | ICD-10-CM | POA: Diagnosis not present

## 2023-08-05 DIAGNOSIS — E785 Hyperlipidemia, unspecified: Secondary | ICD-10-CM | POA: Diagnosis not present

## 2023-08-06 ENCOUNTER — Ambulatory Visit: Payer: BC Managed Care – PPO | Admitting: Family Medicine

## 2023-08-06 ENCOUNTER — Ambulatory Visit: Payer: Self-pay | Admitting: Family Medicine

## 2023-08-06 ENCOUNTER — Encounter: Payer: Self-pay | Admitting: Family Medicine

## 2023-08-06 VITALS — BP 124/74 | HR 98 | Temp 98.2°F | Ht 72.0 in | Wt 271.2 lb

## 2023-08-06 DIAGNOSIS — Z8739 Personal history of other diseases of the musculoskeletal system and connective tissue: Secondary | ICD-10-CM

## 2023-08-06 DIAGNOSIS — Z79899 Other long term (current) drug therapy: Secondary | ICD-10-CM

## 2023-08-06 DIAGNOSIS — E785 Hyperlipidemia, unspecified: Secondary | ICD-10-CM

## 2023-08-06 LAB — LIPID PANEL
Chol/HDL Ratio: 8.4 ratio — ABNORMAL HIGH (ref 0.0–5.0)
Cholesterol, Total: 284 mg/dL — ABNORMAL HIGH (ref 100–199)
HDL: 34 mg/dL — ABNORMAL LOW (ref >39)
LDL Chol Calc (NIH): 147 mg/dL — ABNORMAL HIGH (ref 0–99)
Triglycerides: 541 mg/dL — ABNORMAL HIGH (ref 0–149)
VLDL Cholesterol Cal: 103 mg/dL — ABNORMAL HIGH (ref 5–40)

## 2023-08-06 LAB — TESTOSTERONE: Testosterone: 350 ng/dL (ref 264–916)

## 2023-08-06 LAB — URIC ACID: Uric Acid: 8.7 mg/dL — ABNORMAL HIGH (ref 3.8–8.4)

## 2023-08-06 LAB — LIPOPROTEIN A (LPA): Lipoprotein (a): 15 nmol/L (ref <75.0)

## 2023-08-06 MED ORDER — ROSUVASTATIN CALCIUM 5 MG PO TABS: 5.0000 mg | ORAL_TABLET | Freq: Every day | ORAL | 1 refills | Status: DC

## 2023-08-06 NOTE — Progress Notes (Signed)
 Subjective:    Patient ID: Jeffrey [name], male    DOB: [birth year], 53 y.o.   MRN: [id number]  HPI .Discussed the use of AI scribe software for clinical note transcription with the patient, who gave verbal consent to proceed.  History of Present Illness   Jeffrey [name] "Jeffrey [name]" is a 53 year old male who presents with musculoskeletal pain and concerns about cholesterol management.  He experiences musculoskeletal pain radiating from his neck to his shoulder, which has been persistent and affects his ability to perform daily activities.. The pain starts in his back and moves to his shoulder. He has been attending the gym for about a month to a month and a half, focusing on chest and biceps exercises, and using an elliptical machine for 15 to 20 minutes. However, he missed two weeks due to other commitments and reports that the pain affects his gym activities. He describes his exercise routine as starting with lighter weights to warm up, using a bar for 20 reps, and incorporating rubber bands for loosening up. He performs four sets of twelve reps, gradually increasing the weight. Despite his efforts to be cautious, he experiences pain during workouts, which he attributes to previous habits of lifting heavy weights.  He is concerned about his cholesterol levels and is aware of his genetic predisposition to high cholesterol and triglycerides. He is attempting to improve his diet by eating more greens and vegetables, although he finds it challenging to maintain these changes consistently. He often skips breakfast and opts for a grilled chicken sandwich without bread and green beans for lunch. He reports a significant reduction in alcohol consumption, noting that he did not drink for four days in a row recently, although he consumed six beers yesterday. Previously, he drank daily.  He shares a recent personal loss, stating that his mother passed away on 02/18/2025possibly from a massive heart  attack. He describes the emotional impact of finding her and the subsequent grief, which affected his sleep and mental health for about two months. He mentions a family history of rhythm issues in the heart, which he occasionally experiences himself.  He discusses erectile dysfunction, noting a lack of desire and previous unsuccessful attempts to use Viagra . He expresses frustration with the situation, which has affected his relationship.  He reports episodes of feeling unwell with fluctuating blood pressure readings at home, despite having a large cuff for accurate measurements. He experiences chest and back pain, which he finds confusing and concerning. He describes the pain as being triggered by certain movements and positions. He has been taking allopurinol  to manage his uric acid levels, which have shown some improvement.      Results for orders placed or performed in visit on 01/11/23  Lipid Panel   Collection Time: 08/05/23  8:27 AM  Result Value Ref Range   Cholesterol, Total 284 (H) 100 - 199 mg/dL   Triglycerides 147 (H) 0 - 149 mg/dL   HDL 34 (L) >82 mg/dL   VLDL Cholesterol Cal 103 (H) 5 - 40 mg/dL   LDL Chol Calc (NIH) 956 (H) 0 - 99 mg/dL   Chol/HDL Ratio 8.4 (H) 0.0 - 5.0 ratio  Lipoprotein A (LPA)   Collection Time: 08/05/23  8:27 AM  Result Value Ref Range   Lipoprotein (a) 15.0 <75.0 nmol/L  Uric Acid   Collection Time: 08/05/23  8:27 AM  Result Value Ref Range   Uric Acid 8.7 (H) 3.8 - 8.4 mg/dL  Testosterone  Collection Time: 08/05/23  8:27 AM  Result Value Ref Range   Testosterone 350 264 - 916 ng/dL    The 16-XWRU ASCVD risk score (Arnett DK, et al., 2019) is: 11.4%   Values used to calculate the score:     Age: 28 years     Sex: Male     Is Non-Hispanic African American: No     Diabetic: No     Tobacco smoker: No     Systolic Blood Pressure: 153 mmHg     Is BP treated: No     HDL Cholesterol: 34 mg/dL     Total Cholesterol: 284 mg/dL   Review of  Systems     Objective:   Physical Exam  General-in no acute distress Eyes-no discharge Lungs-respiratory rate normal, CTA CV-no murmurs,RRR Extremities skin warm dry no edema Neuro grossly normal Behavior normal, alert       Assessment & Plan:   1. Hyperlipidemia, unspecified hyperlipidemia type (Primary) Patient's previous lipid shows significant elevation in triglycerides as well as LDL in addition to this his HDL is low part of this is genetic part of this is related to dietary habits as well as alcohol he is trying to do the best he can cutting back on that and eating better and trying to fit in exercise He has had a previous coronary CTA which did not show any significant lesions back in 2022 but his risk for coronary artery disease in the next 10 years is 11% we discussed this in detail discussed risk and benefits of statins patient agrees to try the medication Crestor 5 mg 3 days a week for the first 2 weeks then 1 daily after that taken in the evening time check lab work in 10 to 12 weeks if he has side effects he is to let us  know patient to follow-up within 6 months - Lipid Panel  2. Hx of gout Patient's uric acid level is high he needs to work hard at cutting back on alcohol in addition to that he needs to try to do the best he can with healthy eating any currently right now not taking your acid medication allopurinol   3. High risk medication use Lab work ordered - Hepatic Function Panel  Assessment and Plan    Hyperlipidemia Chronic hyperlipidemia with elevated LDL and triglycerides. Genetic predisposition and dietary factors noted. Medication may be necessary due to genetic factors. - Start rosuvastatin 5 mg, take one tablet in the evening. For the first two weeks, take it on Monday, Wednesday, and Friday after supper, then increase to every evening. - Follow up with blood work in 12 weeks to monitor cholesterol levels.  Family history of heart disease Family  history of heart disease with rhythm issues. Previous test showed minimal arterial buildup. Discussed monitoring cholesterol and potential medication need due to genetic predisposition. - Consider repeating a specialized heart scan late this year or early next year to monitor for arterial buildup.  Gout Elevated uric acid levels at 8.7. Emphasized maintaining levels below 6 to reduce gout attack risk. - Encourage consistent use of allopurinol  to manage uric acid levels.  Erectile Dysfunction Reports erectile dysfunction with loss of desire and performance concerns. Discussed Viagra  use and potential side effects. - Consider using Viagra  as needed, starting with half a tablet 30-45 minutes before sexual activity.  Back and Shoulder Pain Chronic pain exacerbated by physical activity. Pain radiates from neck to shoulder. - Encourage safe exercise practices and avoid overexertion.  He does have musculoskeletal pain I recommend stretching he can continue physical exercise but avoid strenuous lifting He does have erectile dysfunction issues he states he is using Viagra  that he has on a occasional basis  He relates that he was told by person at the gym that his testosterone should be much higher and they told him of a place in [place] that will help treat this-certainly it is the patient's prerogative and choice to do so but some of those type of treatments can come with risk

## 2023-08-10 ENCOUNTER — Encounter: Payer: Self-pay | Admitting: Family Medicine

## 2023-08-11 ENCOUNTER — Other Ambulatory Visit: Payer: Self-pay

## 2023-08-11 MED ORDER — CEPHALEXIN 500 MG PO CAPS: 500.0000 mg | ORAL_CAPSULE | Freq: Three times a day (TID) | ORAL | 0 refills | Status: DC

## 2023-08-11 NOTE — Telephone Encounter (Signed)
 Nurses May send in Keflex 500 mg 1 taken 3 times daily for 10 days Also forward message to [facility] can utilize antibiotics over the next 10 days-if his symptoms continue it would be best to do an office visit  Also if he starts running fever or having blood in his urine you needs to be seen thank you-Dr. Geralyn Knee

## 2023-08-23 ENCOUNTER — Other Ambulatory Visit: Payer: Self-pay | Admitting: Family Medicine

## 2023-08-24 ENCOUNTER — Other Ambulatory Visit: Payer: Self-pay

## 2023-09-16 ENCOUNTER — Other Ambulatory Visit: Payer: Self-pay | Admitting: Family Medicine

## 2023-09-17 ENCOUNTER — Ambulatory Visit: Payer: Self-pay

## 2023-09-17 ENCOUNTER — Other Ambulatory Visit: Payer: Self-pay

## 2023-09-17 MED ORDER — ALLOPURINOL 100 MG PO TABS: ORAL_TABLET | ORAL | 4 refills | Status: DC

## 2023-09-17 NOTE — Telephone Encounter (Signed)
 FYI Only or Action Required?: FYI only for provider.  Patient was last seen in primary care on 08/06/2023 by Alphonsa Glendia LABOR, MD.  Called Nurse Triage reporting Toe Pain.  Symptoms began today.  Interventions attempted: Nothing.  Symptoms are: gradually worsening.  Triage Disposition: See Physician Within 24 Hours  Patient/caregiver understands and will follow disposition?: YesCopied from CRM [id number]. Topic: Clinical - Red Word Triage >> Sep 17, 2023  2:03 PM [place] B wrote: [facility] that prompted transfer to Nurse Triage: pt left big toe is swollen and painful Reason for Disposition  [1] Swollen toe AND [2] no fever  (Exceptions: Just a localized bump from bunion, corns, insect bite, sting.)  Answer Assessment - Initial Assessment Questions 1. ONSET: When did the pain start?      Tuesday 2. LOCATION: Where is the pain located?   (e.g., around nail, entire toe, at foot joint)      Left big toe 3. PAIN: How bad is the pain?    (Scale 1-10; or mild, moderate, severe)     7 4. APPEARANCE: What does the toe look like? (e.g., redness, swelling, bruising, pallor)     Swollen and red 5. CAUSE: What do you think is causing the toe pain?     Gout flare up 6. OTHER SYMPTOMS: Do you have any other symptoms? (e.g., leg pain, rash, fever, numbness)     denies    Gout flare-up. Pt ran out of gout medication and called for refill yesterday. Pt can't walk on it. RN advised UC but pt refused. Ill just sit and wait for medication.  RN called [facility] to see if medication request could be processed. [facility] was able to send request to be filled. Pt was notified.  Protocols used: Toe Pain-A-AH

## 2023-09-20 ENCOUNTER — Telehealth: Payer: Self-pay | Admitting: Family Medicine

## 2023-09-20 ENCOUNTER — Encounter: Payer: Self-pay | Admitting: Family Medicine

## 2023-09-20 ENCOUNTER — Other Ambulatory Visit: Payer: Self-pay | Admitting: Family Medicine

## 2023-09-20 DIAGNOSIS — M1000 Idiopathic gout, unspecified site: Secondary | ICD-10-CM | POA: Diagnosis not present

## 2023-09-20 MED ORDER — COLCHICINE 0.6 MG PO TABS: ORAL_TABLET | ORAL | 2 refills | Status: AC

## 2023-09-20 MED ORDER — ALLOPURINOL 100 MG PO TABS: ORAL_TABLET | ORAL | 4 refills | Status: DC

## 2023-09-20 MED ORDER — PREDNISONE 20 MG PO TABS: ORAL_TABLET | ORAL | 0 refills | Status: DC

## 2023-09-20 NOTE — Telephone Encounter (Signed)
 Patient is due for lab work before his follow-up visit in December He is aware Please order PSA, lipid, liver, metabolic send, uric acid Diagnosis gout, screening prostate cancer, hyperlipidemia, high risk med

## 2023-09-20 NOTE — Telephone Encounter (Signed)
 Noted in chart.

## 2023-09-20 NOTE — Telephone Encounter (Signed)
 Nurses Please order lipid, metabolic 7, liver, PSA, uric acid History of gout, hyperlipidemia, high risk med, screening prostate cancer patient will do this lab work before his checkup in December he is aware thank you

## 2023-09-21 ENCOUNTER — Other Ambulatory Visit: Payer: Self-pay

## 2023-09-21 DIAGNOSIS — Z79899 Other long term (current) drug therapy: Secondary | ICD-10-CM

## 2023-09-21 DIAGNOSIS — Z125 Encounter for screening for malignant neoplasm of prostate: Secondary | ICD-10-CM

## 2023-09-21 DIAGNOSIS — Z8739 Personal history of other diseases of the musculoskeletal system and connective tissue: Secondary | ICD-10-CM

## 2023-09-21 DIAGNOSIS — E785 Hyperlipidemia, unspecified: Secondary | ICD-10-CM

## 2023-09-21 NOTE — Telephone Encounter (Signed)
 Labs ordered as per dr's notes

## 2023-12-21 ENCOUNTER — Encounter: Payer: Self-pay | Admitting: Family Medicine

## 2023-12-24 NOTE — Telephone Encounter (Signed)
 Nurses-please call patient and see what we need to do for him-more than willing to help thank you

## 2024-01-28 ENCOUNTER — Encounter: Payer: Self-pay | Admitting: Family Medicine

## 2024-01-30 ENCOUNTER — Other Ambulatory Visit: Payer: Self-pay | Admitting: Family Medicine

## 2024-02-01 DIAGNOSIS — E785 Hyperlipidemia, unspecified: Secondary | ICD-10-CM | POA: Diagnosis not present

## 2024-02-01 DIAGNOSIS — Z125 Encounter for screening for malignant neoplasm of prostate: Secondary | ICD-10-CM | POA: Diagnosis not present

## 2024-02-01 DIAGNOSIS — Z8739 Personal history of other diseases of the musculoskeletal system and connective tissue: Secondary | ICD-10-CM | POA: Diagnosis not present

## 2024-02-01 DIAGNOSIS — Z79899 Other long term (current) drug therapy: Secondary | ICD-10-CM | POA: Diagnosis not present

## 2024-02-02 ENCOUNTER — Ambulatory Visit: Payer: Self-pay | Admitting: Family Medicine

## 2024-02-02 LAB — LIPID PANEL
Chol/HDL Ratio: 6 ratio — ABNORMAL HIGH (ref 0.0–5.0)
Cholesterol, Total: 216 mg/dL — ABNORMAL HIGH (ref 100–199)
HDL: 36 mg/dL — ABNORMAL LOW (ref >39)
LDL Chol Calc (NIH): 124 mg/dL — ABNORMAL HIGH (ref 0–99)
Triglycerides: 318 mg/dL — ABNORMAL HIGH (ref 0–149)
VLDL Cholesterol Cal: 56 mg/dL — ABNORMAL HIGH (ref 5–40)

## 2024-02-02 LAB — BASIC METABOLIC PANEL WITH GFR
BUN/Creatinine Ratio: 9 (ref 9–20)
BUN: 9 mg/dL (ref 6–24)
CO2: 24 mmol/L (ref 20–29)
Calcium: 9.8 mg/dL (ref 8.7–10.2)
Chloride: 102 mmol/L (ref 96–106)
Creatinine, Ser: 0.97 mg/dL (ref 0.76–1.27)
Glucose: 103 mg/dL — ABNORMAL HIGH (ref 70–99)
Potassium: 5 mmol/L (ref 3.5–5.2)
Sodium: 142 mmol/L (ref 134–144)
eGFR: 95 mL/min/1.73 (ref >59)

## 2024-02-02 LAB — HEPATIC FUNCTION PANEL
ALT: 40 IU/L (ref 0–44)
AST: 27 IU/L (ref 0–40)
Albumin: 4.9 g/dL (ref 3.8–4.9)
Alkaline Phosphatase: 80 IU/L (ref 47–123)
Bilirubin Total: 0.3 mg/dL (ref 0.0–1.2)
Bilirubin, Direct: 0.09 mg/dL (ref 0.00–0.40)
Total Protein: 7.6 g/dL (ref 6.0–8.5)

## 2024-02-02 LAB — URIC ACID: Uric Acid: 8.3 mg/dL (ref 3.8–8.4)

## 2024-02-02 LAB — PSA: Prostate Specific Ag, Serum: 0.5 ng/mL (ref 0.0–4.0)

## 2024-02-08 ENCOUNTER — Ambulatory Visit: Admitting: Family Medicine

## 2024-02-08 VITALS — BP 133/89 | HR 108 | Temp 95.7°F | Ht 72.0 in | Wt 271.0 lb

## 2024-02-08 DIAGNOSIS — Z8739 Personal history of other diseases of the musculoskeletal system and connective tissue: Secondary | ICD-10-CM

## 2024-02-08 DIAGNOSIS — F411 Generalized anxiety disorder: Secondary | ICD-10-CM

## 2024-02-08 DIAGNOSIS — R10A2 Flank pain, left side: Secondary | ICD-10-CM

## 2024-02-08 DIAGNOSIS — Z79899 Other long term (current) drug therapy: Secondary | ICD-10-CM

## 2024-02-08 DIAGNOSIS — E785 Hyperlipidemia, unspecified: Secondary | ICD-10-CM

## 2024-02-08 DIAGNOSIS — R0789 Other chest pain: Secondary | ICD-10-CM

## 2024-02-08 DIAGNOSIS — N529 Male erectile dysfunction, unspecified: Secondary | ICD-10-CM

## 2024-02-08 MED ORDER — ROSUVASTATIN CALCIUM 5 MG PO TABS: ORAL_TABLET | ORAL | 2 refills | Status: AC

## 2024-02-08 MED ORDER — ALLOPURINOL 100 MG PO TABS: ORAL_TABLET | ORAL | 4 refills | Status: AC

## 2024-02-08 MED ORDER — DICLOFENAC SODIUM 75 MG PO TBEC: DELAYED_RELEASE_TABLET | ORAL | 3 refills | Status: AC

## 2024-02-08 MED ORDER — PANTOPRAZOLE SODIUM 40 MG PO TBEC: 40.0000 mg | DELAYED_RELEASE_TABLET | Freq: Every day | ORAL | 3 refills | Status: AC

## 2024-02-08 MED ORDER — SILDENAFIL CITRATE 100 MG PO TABS: 50.0000 mg | ORAL_TABLET | Freq: Every day | ORAL | 11 refills | Status: AC | PRN

## 2024-02-08 NOTE — Progress Notes (Signed)
 Subjective:    Patient ID: Jeffrey [name], male    DOB: 10/09/1970, 53 y.o.   MRN: [id number]  HPI 6 month follow up  Soreness/ discomfort in upper abdominal rea as well mid back - chronic issue unknown cause - worse at night  Patient states the pain gets severe at times Hits in his back flank but also in his abdomen This been going on for months but getting worse Previous workup negative  Patient with pain in right side of his neck radiates down into the shoulder and up the back of his head causes a lot of pain and discomfort.  Does not typically go down the arm but occasionally to the hand feels a lot of stiffness in the neck specially with turning head left to right  Patient also voiced concern that he was turned down for life insurance because he has heart disease-I did discuss with him how his coronary artery test from 2023 showed minimal coronary artery buildup but from a insurance company standpoint they often rejected anyone as this He is on cholesterol medicine he can only tolerate taking every other day to keep his LDL under better control   He has been losing some weight through healthy eating and exercise and he is also trying to do the best he can at healthy habits he is trying to minimize salt in his diet and also will handle stress well  Patient has the ongoing trouble with his thoracic spine that radiates around the left side to the center part of the chest certain movements make it worse doubtful that he is having a heart attack with this more likely related to pinched nerve  Results for orders placed or performed in visit on 09/21/23  Lipid Panel   Collection Time: 02/01/24  8:14 AM  Result Value Ref Range   Cholesterol, Total 216 (H) 100 - 199 mg/dL   Triglycerides 681 (H) 0 - 149 mg/dL   HDL 36 (L) >60 mg/dL   VLDL Cholesterol Cal 56 (H) 5 - 40 mg/dL   LDL Chol Calc (NIH) 875 (H) 0 - 99 mg/dL   Chol/HDL Ratio 6.0 (H) 0.0 - 5.0 ratio  Hepatic Function Panel    Collection Time: 02/01/24  8:14 AM  Result Value Ref Range   Total Protein 7.6 6.0 - 8.5 g/dL   Albumin 4.9 3.8 - 4.9 g/dL   Bilirubin Total 0.3 0.0 - 1.2 mg/dL   Bilirubin, Direct 9.90 0.00 - 0.40 mg/dL   Alkaline Phosphatase 80 47 - 123 IU/L   AST 27 0 - 40 IU/L   ALT 40 0 - 44 IU/L  Basic Metabolic Panel   Collection Time: 02/01/24  8:14 AM  Result Value Ref Range   Glucose 103 (H) 70 - 99 mg/dL   BUN 9 6 - 24 mg/dL   Creatinine, Ser 9.02 0.76 - 1.27 mg/dL   eGFR 95 >40 fO/fpw/8.26   BUN/Creatinine Ratio 9 9 - 20   Sodium 142 134 - 144 mmol/L   Potassium 5.0 3.5 - 5.2 mmol/L   Chloride 102 96 - 106 mmol/L   CO2 24 20 - 29 mmol/L   Calcium  9.8 8.7 - 10.2 mg/dL  PSA   Collection Time: 02/01/24  8:14 AM  Result Value Ref Range   Prostate Specific Ag, Serum 0.5 0.0 - 4.0 ng/mL  Uric Acid   Collection Time: 02/01/24  8:14 AM  Result Value Ref Range   Uric Acid 8.3 3.8 - 8.4 mg/dL  Review of Systems     Objective:   Physical Exam  General-in no acute distress Eyes-no discharge Lungs-respiratory rate normal, CTA CV-no murmurs,RRR Extremities skin warm dry no edema Neuro grossly normal Behavior normal, alert Subjective discomfort right side of neck radiating into the shoulder strength overall good        Assessment & Plan:  1. Hyperlipidemia, unspecified hyperlipidemia type (Primary) Currently patient can tolerate statin every other day he will continue this along with healthy eating we will check labs again in 6 months  2. Hx of gout Reinitiate allopurinol  patient stopped taking refills were given recheck labs 6 months  3. Erectile dysfunction, unspecified erectile dysfunction type Viagra  as needed  4. High risk medication use Liver enzymes look good history of fatty liver doing better now with weight loss and no drinking  5. GAD (generalized anxiety disorder) Stress levels are reasonable  6. Left flank pain After further discussion patient would  like to try Voltaren  to see if that might help he will give us  feedback if that does not help consider Lyrica  7. 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

## 2024-02-11 ENCOUNTER — Telehealth: Payer: Self-pay | Admitting: Family Medicine

## 2024-02-11 ENCOUNTER — Encounter: Payer: Self-pay | Admitting: Family Medicine

## 2024-02-11 NOTE — Telephone Encounter (Signed)
 Nurses Please order stat CT scan of the abdomen pelvis with contrast-ideally I would like to have the CAT scan completed either Monday Tuesday or Wednesday Thursday at the latest thank you He was just recently seen please see documentation Reason-abdominal pain -Please notify patient Please see his MyChart message thank you-Dr. Glendia

## 2024-02-16 NOTE — Telephone Encounter (Signed)
 STAT CT ordered in [facility]- Referral Coordinator notified

## 2024-02-17 ENCOUNTER — Ambulatory Visit
Admission: RE | Admit: 2024-02-17 | Discharge: 2024-02-17 | Disposition: A | Discharge status: Home / Self Care | Source: Ambulatory Visit | Attending: Family Medicine | Admitting: Family Medicine

## 2024-02-17 DIAGNOSIS — K760 Fatty (change of) liver, not elsewhere classified: Secondary | ICD-10-CM | POA: Diagnosis not present

## 2024-02-17 DIAGNOSIS — K5730 Diverticulosis of large intestine without perforation or abscess without bleeding: Secondary | ICD-10-CM | POA: Diagnosis not present

## 2024-02-17 MED ORDER — IOPAMIDOL (ISOVUE-300) INJECTION 61%
100.0000 mL | Freq: Once | INTRAVENOUS | Status: AC | PRN
  Administered 2024-02-17: 10:00:00 100 mL via INTRAVENOUS

## 2024-02-18 ENCOUNTER — Ambulatory Visit: Payer: Self-pay | Admitting: Family Medicine

## 2024-02-18 NOTE — Telephone Encounter (Signed)
 Nurses I read through the message from [place]  So it is true that sometimes adrenal gland nodules can cause symptoms but it depends if they are active or not  In other words some are benign and actually cause no problems whatsoever whereas other times they can be active  As stated in the previous message we were connecting with the specialist.  The specialist can run some additional test that can see whether or not this issue is a nodule that is causing problems or is a benign nodule that is not causing any problems.  We will go ahead with the referral to help set this up.  Please go ahead with referral to Dr. Antonetta for further evaluation of adrenal nodule. Please notify Marsha that the referral process has been initiated-their office should reach out to him to schedule an appointment somewhere in the next several weeks if he does not hear anything from them over the course of the next 10 to 14 days to let us  know  The vast majority of these findings tend to be benign.  But to be thorough we will get it checked out.  The specialist will be able to run the proper test to help come to a definitive conclusion.  You may share this message with Marsha as well-thanks-Dr. Glendia

## 2024-02-18 NOTE — Telephone Encounter (Signed)
 CT completed and provider has results

## 2024-02-21 NOTE — Progress Notes (Unsigned)
 GI Office Note    Referring Provider: Alphonsa Glendia LABOR, MD Primary Care Physician:  Alphonsa Glendia LABOR, MD Primary Gastroenterologist: Carlin POUR. Cindie, DO  Date:  02/22/2024  ID:  Jeffrey [name], DOB 07/04/1970, MRN [id number]   Chief Complaint   Chief Complaint  Patient presents with   Follow-up    Follow up. Bloating, gas   History of Present Illness  Jeffrey [name] is a 53 y.o. male with a history of GERD, COPD, pyloric stenosis, tubovillous adenomatous colon polyps, and hemorrhoids presenting today with complaint of bloating and gas..   EGD 08/19/2020:  - Single gastric polyp resected and retrieved - gastric stenosis at the pylorus s/p dilation.  - Pathology with fundic gland polyp, mild chronic gastritis with focal activity, no H. pylori or intestinal metaplasia.   Last Colonoscopy 01/09/2020:  - Nonbleeding internal hemorrhoids - two 6-8 mm polyps resected and retrieved - two 3-5 mm polyps resected and retrieved.   - Pathology with tubular adenomas and hyperplastic polyps.   - Recommended 5-year surveillance.  Last office visit June 2024 with Josette Centers, PA-C. Seen for rectal bleeding and follow up GERD.  Had reported he passed some blood rectally couple weeks prior which occurred twice and was not mixed within the stool.  Not had any recurrence since.  This has happened in the past on the toilet tissue.  Did admit to straining with a bowel movement and then started a fiber supplement a week prior which she noted was already helping.  Having some lower abdominal soreness and noticed that after the rectal bleeding occurred.  Denied any NSAIDs.  Reported his reflux is well-controlled on pantoprazole  and does have an occasional bloating sensation he thought was secondary to beer consumption. Scheduled for colonoscopy . Advised preparation H if hemorrhoids return. Reinforced GERD diet.   Colonoscopy July 2024: - Non- bleeding internal hemorrhoids.  - Diverticulosis in the  sigmoid colon.  - Two 3 to 4 mm polyps in the transverse colon and in the ascending colon - Two 3 to 5 mm polyps in the rectum and in the sigmoid colon  - The examination was otherwise normal. - Path: tubular adenomas and polypoid fragments (hyperplastic).  - Use fiber, limit toilet time.  - Repeat in 5 years.   CT A/P 02/17/2024 IMPRESSION: 1. No acute findings in the abdomen/pelvis. 2. Diverticulosis of the distal descending and sigmoid colon without active inflammation. 3. 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 4. Minimal hepatic steatosis. 5. Evidence of cholecystectomy PCP gave referral to endocrine for follow up on adrenal lesion.  Today:  Discussed the use of AI scribe software for clinical note transcription with the patient, who gave verbal consent to proceed.  He experiences gas, bloating, and stomach ulcers primarily in the upper abdomen. A recent CT scan showed no significant abnormalities in the stomach or small bowel but revealed sigmoid diverticulosis and adrenal lesion. He has a history of pyloric stenosis, which was dilated in 2022, and mild chronic gastritis. A fundic gland polyp was removed during an upper endoscopy.  He experiences constipation, with bowel movements occurring every other day or less frequently. His stools vary on the Bristol stool chart, often starting as type 1 and progressing to types 4, 5, and 6. He recently began taking fiber capsules once daily to address this issue but only a few days ago. Loud abdominal rumbling and significant flatulence accompany the bloating. No significant lower abdominal pain, rectal bleeding, or black stools.  He has  a history of a cholecystectomy and mild fatty liver. 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 He has not had recent thyroid testing or been tested  for celiac disease.  He takes pantoprazole  for reflux, which he feels is mostly well controlled. He has been informed of elevated glucose levels, which may be related to recent food intake.      Wt Readings from Last 6 Encounters:  02/22/24 268 lb (121.6 kg)  02/08/24 271 lb (122.9 kg)  08/06/23 271 lb 3.2 oz (123 kg)  12/29/22 270 lb 9.6 oz (122.7 kg)  09/14/22 261 lb 14.5 oz (118.8 kg)  09/09/22 261 lb 14.5 oz (118.8 kg)   Body mass index is 36.35 kg/m.  Current Outpatient Medications  Medication Sig Dispense Refill   albuterol  (VENTOLIN  HFA) 108 (90 Base) MCG/ACT inhaler INHALE 2 PUFFS BY MOUTH EVERY 4 HOURS AS NEEDED FOR WHEEZING OR SHORTNESS OF BREATH 9 g 0   Ascorbic Acid (VITAMIN C) 1000 MG tablet Take 1,000 mg by mouth in the morning.     pantoprazole  (PROTONIX ) 40 MG tablet Take 1 tablet (40 mg total) by mouth daily. 90 tablet 3   sildenafil  (VIAGRA ) 100 MG tablet Take 0.5-1 tablets (50-100 mg total) by mouth daily as needed for erectile dysfunction. 5 tablet 11   allopurinol  (ZYLOPRIM ) 100 MG tablet Take one tablet po daily (Patient not taking: Reported on 02/22/2024) 30 tablet 4   colchicine  0.6 MG tablet TAKE 1 TABLET BY MOUTH ONCE DAILY FOR NEXT 30 DAYS; THEN MAY TAKE AS NEEDED (Patient not taking: Reported on 02/22/2024) 30 tablet 2   diclofenac  (VOLTAREN ) 75 MG EC tablet 1 bid prn musculoskeletal pain (Patient not taking: Reported on 02/22/2024) 30 tablet 3   loratadine (CLARITIN) 10 MG tablet Take 10 mg by mouth daily as needed for allergies. (Patient not taking: Reported on 02/22/2024)     rosuvastatin  (CRESTOR ) 5 MG tablet 1 every other day (Patient not taking: Reported on 02/22/2024) 45 tablet 2   No current facility-administered medications for this visit.    Past Medical History:  Diagnosis Date   COPD (chronic obstructive pulmonary disease) (HCC)    GERD (gastroesophageal reflux disease)     Past Surgical History:  Procedure Laterality Date   BALLOON  DILATION N/A 08/19/2020   Procedure: BALLOON DILATION;  Surgeon: Cindie Carlin POUR, DO;  [facility];  Service: Endoscopy;  Laterality: N/A;   BIOPSY  08/19/2020   Procedure: BIOPSY;  Surgeon: Cindie Carlin POUR, DO;  [facility];  Service: Endoscopy;;   cardiac stress test     CHOLECYSTECTOMY     COLONOSCOPY N/A 09/19/2013   Procedure: COLONOSCOPY;  Surgeon: Margo LITTIE Haddock, MD;  [facility]: [facility];  Service: Endoscopy;  normal TI, 1 colon polyp removed but not retrieved, mild sigmoid diverticulosis with associated luminal narrowing, moderate sized external hemorrhoids.  Recommended repeat colonoscopy in 5 years.    COLONOSCOPY  2011   Dr. Haddock; 1.2 cm rectal tubulovillous adenoma    COLONOSCOPY WITH PROPOFOL  N/A 01/09/2020   Procedure: COLONOSCOPY WITH PROPOFOL ;  Surgeon: Cindie Carlin POUR, DO;  [facility];  Service: Endoscopy;  Laterality: N/A;  1:30pm   COLONOSCOPY WITH PROPOFOL  N/A 09/14/2022   Procedure: COLONOSCOPY WITH PROPOFOL ;  Surgeon: Cindie Carlin POUR, DO;  [facility];  Service: Endoscopy;  Laterality:  N/A;  730am, asa 3   ESOPHAGOGASTRODUODENOSCOPY N/A 09/19/2013   Procedure: ESOPHAGOGASTRODUODENOSCOPY (EGD);  Surgeon: Margo LITTIE Haddock, MD; normal-appearing esophagus s/p empiric dilation, mild erosive gastritis s/p biopsy, small stenosis transversable after dilation at the pylorus, normal examined duodenum.  Gastric biopsies with reactive/regenerative changes and focal intestinal metaplasia with no H. pylori, dysplasia, or malignancy.   ESOPHAGOGASTRODUODENOSCOPY (EGD) WITH PROPOFOL  N/A 08/19/2020   Procedure: ESOPHAGOGASTRODUODENOSCOPY (EGD) WITH PROPOFOL ;  Surgeon: Cindie Carlin POUR, DO;  [facility];  Service: Endoscopy;  Laterality: N/A;  10:45am   ESOPHAGOGASTRODUODENOSCOPY ENDOSCOPY     MALONEY DILATION N/A 09/19/2013   Procedure: MALONEY DILATION;  Surgeon: Margo LITTIE Haddock, MD;  [facility];  Service:  Endoscopy;  Laterality: N/A;   POLYPECTOMY  01/09/2020   Procedure: POLYPECTOMY;  Surgeon: Cindie Carlin POUR, DO;  [facility];  Service: Endoscopy;;   POLYPECTOMY  08/19/2020   Procedure: POLYPECTOMY;  Surgeon: Cindie Carlin POUR, DO;  [facility];  Service: Endoscopy;;   POLYPECTOMY  09/14/2022   Procedure: POLYPECTOMY;  Surgeon: Cindie Carlin POUR, DO;  [facility];  Service: Endoscopy;;   SAVORY DILATION N/A 09/19/2013   Procedure: SAVORY DILATION;  Surgeon: Margo LITTIE Haddock, MD;  [facility];  Service: Endoscopy;  Laterality: N/A;    Family History  Problem Relation Age of Onset   Colon cancer Neg Hx    Colon polyps Neg Hx     Allergies as of 02/22/2024   (No Known Allergies)    Social History   Socioeconomic History   Marital status: Divorced    Spouse name: Not on file   Number of children: Not on file   Years of education: Not on file   Highest education level: 9th grade  Occupational History   Not on file  Tobacco Use   Smoking status: Former    Current packs/day: 0.00    Average packs/day: 2.0 packs/day for 15.0 years (30.0 ttl pk-yrs)    Types: Cigarettes    Start date: 09/13/1992    Quit date: 09/14/2007    Years since quitting: 16.4   Smokeless tobacco: Current    Types: Snuff  Vaping Use   Vaping status: Never Used  Substance and Sexual Activity   Alcohol use: Yes    Comment: 3-4 beer 4/7 days a week.   Drug use: Not Currently   Sexual activity: Yes  Other Topics Concern   Not on file  Social History Narrative   [employer]. RACES CARS WITH NITRO. STEP DAUGHTER RACES AS WELL.   Social Drivers of Health   Tobacco Use: High Risk (02/22/2024)   Patient History    Smoking Tobacco Use: Former    Smokeless Tobacco Use: Current    Passive Exposure: Not on file  Financial Resource Strain: Low Risk (02/08/2024)   Overall Financial Resource Strain (CARDIA)    Difficulty of Paying Living Expenses: Not  hard at all  Food Insecurity: No Food Insecurity (02/08/2024)   Epic    Worried About Programme Researcher, Broadcasting/film/video in the Last Year: Never true    Ran Out of Food in the Last Year: Never true  Transportation Needs: No Transportation Needs (02/08/2024)   Epic    Lack of Transportation (Medical): No    Lack of Transportation (Non-Medical): No  Physical Activity: Sufficiently Active (02/08/2024)   Exercise Vital Sign    Days of Exercise per Week: 3 days    Minutes of  Exercise per Session: 60 min  Stress: Stress Concern Present (02/08/2024)   [employer] of Occupational Health - Occupational Stress Questionnaire    Feeling of Stress: Rather much  Social Connections: Moderately Integrated (02/08/2024)   Social Connection and Isolation Panel    Frequency of Communication with Friends and Family: Twice a week    Frequency of Social Gatherings with Friends and Family: Twice a week    Attends Religious Services: 1 to 4 times per year    Active Member of Clubs or Organizations: No    Attends Engineer, Structural: Not on file    Marital Status: Living with partner  Depression (PHQ2-9): Low Risk (02/08/2024)   Depression (PHQ2-9)    PHQ-2 Score: 0  Alcohol Screen: Medium Risk (02/08/2024)   Alcohol Screen    Last Alcohol Screening Score (AUDIT): 8  Housing: Unknown (02/08/2024)   Epic    Unable to Pay for Housing in the Last Year: No    Number of Times Moved in the Last Year: Not on file    Homeless in the Last Year: No  Utilities: Not on file  Health Literacy: Not on file    Review of Systems   Gen: Denies fever, chills, anorexia. Denies fatigue, weakness, weight loss.  CV: Denies chest pain, palpitations, syncope, peripheral edema, and claudication. Resp: Denies dyspnea at rest, cough, wheezing, coughing up blood, and pleurisy. GI: See HPI Derm: Denies rash, itching, dry skin Psych: Denies depression, anxiety, memory loss, confusion. No homicidal or suicidal ideation.  Heme: Denies  bruising, bleeding, and enlarged lymph nodes.  Physical Exam   BP 127/87 (BP Location: Right Arm, Patient Position: Sitting, Cuff Size: Large)   Pulse 67   Temp 98.3 F (36.8 C) (Temporal)   Ht 6' (1.829 m)   Wt 268 lb (121.6 kg)   BMI 36.35 kg/m   General:   Alert and oriented. No distress noted. Pleasant and cooperative.  Head:  Normocephalic and atraumatic. Eyes:  Conjuctiva clear without scleral icterus. Mouth:  Oral mucosa pink and moist. Good dentition. No lesions. Lungs:  Clear to auscultation bilaterally. No wheezes, rales, or rhonchi. No distress.  Heart:  S1, S2 present without murmurs appreciated.  Abdomen:  +BS, soft, non-distended. Mild ttp to epigastrium and LUQ. No rebound or guarding. No HSM or masses noted. Rectal: referred Msk:  Symmetrical without gross deformities. Normal posture. Extremities:  Without edema. Neurologic:  Alert and  oriented x4 Psych:  Alert and cooperative. Normal mood and affect.  Assessment & Plan  Jeffrey [name] is a 53 y.o. male presenting today with bloating, gas, constipation, and reflux.     Bloating, increased flatulence Bloating and increased flatulence primarily in the upper abdomen. CT scan showed no abnormalities in the pancreas, spleen, stomach, or small bowel. Differential includes SIBO or intestinal methanogenic overgrowth. Symptoms may be related to constipation. Unable to rule out celiac, SIBO, IMO.  - Recommended Miralax once daily, decrease to half daily if diarrhea occurs. - Discussed 25-35 grams of fiber daily for colon health. - Will consider testing for SIBO or intestinal methanogenic overgrowth if no improvement with constipation management.  Chronic idiopathic constipation Varying stool consistency on the Bristol stool chart, primarily types 4, 5, and 6, with occasional type 1 stools. Bowel movements occur every other day or longer. Recently started fiber capsules. Loud abdominal rumbling and flatulence present. -  Recommended Miralax once daily, decrease to half daily if diarrhea occurs. - Discussed 25-35 grams of fiber daily for  colon health.  Gastroesophageal reflux disease and epigastric tenderness GERD symptoms well controlled with pantoprazole . Epigastric tenderness noted. Mild chronic gastritis and fundic gland polyp noted on previous endoscopy. Consideration of increasing pantoprazole  to twice daily or switching PPIs if symptoms worsen. - Continue pantoprazole , consider increasing to twice daily or switching PPIs if symptoms worsen.  History of pyloric stenosis, status post dilation Pyloric stenosis previously dilated with endoscopy in 2022. No current nausea or vomiting. Consideration of repeat upper endoscopy in the future. - Will consider repeat upper endoscopy in the future.      Follow up   Follow up 2 months.     Charmaine Melia, MSN, FNP-BC, AGACNP-BC [facility]

## 2024-02-22 ENCOUNTER — Encounter: Payer: Self-pay | Admitting: Gastroenterology

## 2024-02-22 ENCOUNTER — Telehealth: Payer: Self-pay | Admitting: Family Medicine

## 2024-02-22 ENCOUNTER — Encounter: Payer: Self-pay | Admitting: Family Medicine

## 2024-02-22 ENCOUNTER — Ambulatory Visit: Admitting: Gastroenterology

## 2024-02-22 ENCOUNTER — Other Ambulatory Visit: Payer: Self-pay

## 2024-02-22 VITALS — BP 127/87 | HR 67 | Temp 98.3°F | Ht 72.0 in | Wt 268.0 lb

## 2024-02-22 DIAGNOSIS — K5904 Chronic idiopathic constipation: Secondary | ICD-10-CM

## 2024-02-22 DIAGNOSIS — K219 Gastro-esophageal reflux disease without esophagitis: Secondary | ICD-10-CM

## 2024-02-22 DIAGNOSIS — R140 Abdominal distension (gaseous): Secondary | ICD-10-CM | POA: Diagnosis not present

## 2024-02-22 DIAGNOSIS — R143 Flatulence: Secondary | ICD-10-CM

## 2024-02-22 DIAGNOSIS — Z8719 Personal history of other diseases of the digestive system: Secondary | ICD-10-CM

## 2024-02-22 DIAGNOSIS — R10816 Epigastric abdominal tenderness: Secondary | ICD-10-CM | POA: Diagnosis not present

## 2024-02-22 NOTE — Telephone Encounter (Signed)
 Nurses Please go ahead and put in referral to endocrinology-Dr. Lenis for evaluation of adrenal nodule.-I have already communicated with Dr. Carvel states he would be willing to see the patient.  Please proceed forward with the referral

## 2024-02-22 NOTE — Patient Instructions (Addendum)
 Continue your fiber supplementation - please ensure you are taking the recommended 3-5 capsules daily.   If after a couple more weeks on therapy you are not noticing any difference in your gassiness or the frequency of your bowel movements or and improved consistency then please add MiraLAX either a half a capful or a capful in 6-8 ounces of water  depending on your comfort level to try to help with bowel regularity.  Given the array of stool consistency for you and frequency I suspect your bloating and gassiness is more related to constipation however I am getting some additional blood work in regards to checking your thyroid as well as assessing for celiac given your upper abdominal discomfort along with the gassiness and constipation.  Follow a GERD diet:  Avoid fried, fatty, greasy, spicy, citrus foods. Avoid caffeine and carbonated beverages. Avoid chocolate. Try eating 4-6 small meals a day rather than 3 large meals. Do not eat within 3 hours of laying down. Prop head of bed up on wood or bricks to create a 6 inch incline.  We can certainly explore testing for bacterial overgrowth within your GI tract and/or consider repeating your upper endoscopy given your upper abdominal soreness and worsening of symptoms in the future pending this initial workup ad trial of medication.  As we discussed I would recommend us  repeating testing for H. pylori if thyroid and celiac labs are normal and no improvement in the bloating or gassiness with better regularity of bowel movements.  I have ordered your lab work for [facility].  If you end up referring [facility] please let us  know and I will change the orders.  I hope you have a Gertha Christmas and a happy new year!  We will plan for a tentative follow-up in 2 months but please do not hesitate to reach out via MyChart for any questions or concerns between now and then.  It was a pleasure to see you today. I want to create trusting relationships with patients. If  you receive a survey regarding your visit,  I greatly appreciate you taking time to fill this out on paper or through your MyChart. I value your feedback.  Charmaine Melia, MSN, FNP-BC, AGACNP-BC [facility]

## 2024-02-24 ENCOUNTER — Other Ambulatory Visit: Payer: Self-pay

## 2024-02-24 DIAGNOSIS — E279 Disorder of adrenal gland, unspecified: Secondary | ICD-10-CM

## 2024-03-27 ENCOUNTER — Other Ambulatory Visit: Payer: Self-pay | Admitting: Family Medicine

## 2024-05-04 ENCOUNTER — Ambulatory Visit: Admitting: "Endocrinology

## 2024-06-07 ENCOUNTER — Ambulatory Visit: Admitting: "Endocrinology

## 2024-08-08 ENCOUNTER — Ambulatory Visit: Admitting: Family Medicine
